# Patient Record
Sex: Male | Born: 1970 | Race: White | Hispanic: No | Marital: Single | Smoking: Current every day smoker
Health system: Southern US, Community
[De-identification: ages and names within clinical notes are randomized; demographics above are authoritative.]

## PROBLEM LIST (undated history)

## (undated) DIAGNOSIS — F1010 Alcohol abuse, uncomplicated: Secondary | ICD-10-CM

## (undated) DIAGNOSIS — M549 Dorsalgia, unspecified: Secondary | ICD-10-CM

## (undated) DIAGNOSIS — G8929 Other chronic pain: Secondary | ICD-10-CM

## (undated) DIAGNOSIS — F1120 Opioid dependence, uncomplicated: Secondary | ICD-10-CM

## (undated) HISTORY — PX: MANDIBLE FRACTURE SURGERY: SHX706

---

## 1993-01-29 HISTORY — PX: MOUTH SURGERY: SHX715

## 1997-09-01 ENCOUNTER — Emergency Department (HOSPITAL_COMMUNITY): Admission: EM | Admit: 1997-09-01 | Discharge: 1997-09-01 | Payer: Self-pay | Admitting: Internal Medicine

## 1997-10-13 ENCOUNTER — Emergency Department (HOSPITAL_COMMUNITY): Admission: EM | Admit: 1997-10-13 | Discharge: 1997-10-13 | Payer: Self-pay | Admitting: Emergency Medicine

## 1997-10-13 ENCOUNTER — Encounter: Payer: Self-pay | Admitting: Emergency Medicine

## 1998-04-26 ENCOUNTER — Encounter: Payer: Self-pay | Admitting: Neurosurgery

## 1998-04-26 ENCOUNTER — Ambulatory Visit (HOSPITAL_COMMUNITY): Admission: RE | Admit: 1998-04-26 | Discharge: 1998-04-26 | Payer: Self-pay | Admitting: Neurosurgery

## 2002-05-18 ENCOUNTER — Emergency Department (HOSPITAL_COMMUNITY): Admission: EM | Admit: 2002-05-18 | Discharge: 2002-05-19 | Payer: Self-pay

## 2002-05-19 ENCOUNTER — Encounter: Payer: Self-pay | Admitting: Emergency Medicine

## 2002-08-26 ENCOUNTER — Emergency Department (HOSPITAL_COMMUNITY): Admission: EM | Admit: 2002-08-26 | Discharge: 2002-08-26 | Payer: Self-pay | Admitting: Emergency Medicine

## 2002-08-26 ENCOUNTER — Encounter: Payer: Self-pay | Admitting: Emergency Medicine

## 2002-09-09 ENCOUNTER — Emergency Department (HOSPITAL_COMMUNITY): Admission: EM | Admit: 2002-09-09 | Discharge: 2002-09-10 | Payer: Self-pay | Admitting: Emergency Medicine

## 2002-09-10 ENCOUNTER — Encounter: Payer: Self-pay | Admitting: Emergency Medicine

## 2002-11-04 ENCOUNTER — Emergency Department (HOSPITAL_COMMUNITY): Admission: AD | Admit: 2002-11-04 | Discharge: 2002-11-04 | Payer: Self-pay

## 2002-11-04 ENCOUNTER — Encounter: Payer: Self-pay | Admitting: Emergency Medicine

## 2003-01-01 ENCOUNTER — Emergency Department (HOSPITAL_COMMUNITY): Admission: EM | Admit: 2003-01-01 | Discharge: 2003-01-01 | Payer: Self-pay | Admitting: Emergency Medicine

## 2005-05-12 ENCOUNTER — Emergency Department (HOSPITAL_COMMUNITY): Admission: EM | Admit: 2005-05-12 | Discharge: 2005-05-12 | Payer: Self-pay | Admitting: Emergency Medicine

## 2007-01-27 ENCOUNTER — Emergency Department (HOSPITAL_COMMUNITY): Admission: EM | Admit: 2007-01-27 | Discharge: 2007-01-27 | Payer: Self-pay | Admitting: Emergency Medicine

## 2008-01-28 ENCOUNTER — Emergency Department (HOSPITAL_COMMUNITY): Admission: EM | Admit: 2008-01-28 | Discharge: 2008-01-28 | Payer: Self-pay | Admitting: Emergency Medicine

## 2009-03-31 ENCOUNTER — Emergency Department (HOSPITAL_COMMUNITY): Admission: EM | Admit: 2009-03-31 | Discharge: 2009-03-31 | Payer: Self-pay | Admitting: Emergency Medicine

## 2009-10-18 ENCOUNTER — Emergency Department (HOSPITAL_COMMUNITY): Admission: EM | Admit: 2009-10-18 | Discharge: 2009-10-18 | Payer: Self-pay | Admitting: Emergency Medicine

## 2010-09-23 ENCOUNTER — Emergency Department (HOSPITAL_COMMUNITY)
Admission: EM | Admit: 2010-09-23 | Discharge: 2010-09-23 | Disposition: A | Discharge status: Home / Self Care | Payer: Self-pay | Attending: Emergency Medicine | Admitting: Emergency Medicine

## 2010-09-23 ENCOUNTER — Emergency Department (HOSPITAL_COMMUNITY): Payer: Self-pay

## 2010-09-23 DIAGNOSIS — IMO0002 Reserved for concepts with insufficient information to code with codable children: Secondary | ICD-10-CM | POA: Insufficient documentation

## 2010-09-23 DIAGNOSIS — R0682 Tachypnea, not elsewhere classified: Secondary | ICD-10-CM | POA: Insufficient documentation

## 2010-09-23 DIAGNOSIS — F17200 Nicotine dependence, unspecified, uncomplicated: Secondary | ICD-10-CM | POA: Insufficient documentation

## 2010-09-23 DIAGNOSIS — M25469 Effusion, unspecified knee: Secondary | ICD-10-CM | POA: Insufficient documentation

## 2010-09-23 DIAGNOSIS — M25569 Pain in unspecified knee: Secondary | ICD-10-CM | POA: Insufficient documentation

## 2010-10-10 ENCOUNTER — Inpatient Hospital Stay (INDEPENDENT_AMBULATORY_CARE_PROVIDER_SITE_OTHER)
Admission: RE | Admit: 2010-10-10 | Discharge: 2010-10-10 | Disposition: A | Discharge status: Home / Self Care | Payer: Self-pay | Source: Ambulatory Visit | Attending: Family Medicine | Admitting: Family Medicine

## 2010-10-10 DIAGNOSIS — L02419 Cutaneous abscess of limb, unspecified: Secondary | ICD-10-CM

## 2010-10-10 DIAGNOSIS — L02429 Furuncle of limb, unspecified: Secondary | ICD-10-CM

## 2010-10-15 ENCOUNTER — Inpatient Hospital Stay (HOSPITAL_COMMUNITY)
Admission: EM | Admit: 2010-10-15 | Discharge: 2010-10-18 | DRG: 603 | Disposition: A | Discharge status: Home / Self Care | Payer: Medicaid Other | Attending: Family Medicine | Admitting: Family Medicine

## 2010-10-15 DIAGNOSIS — M7989 Other specified soft tissue disorders: Secondary | ICD-10-CM

## 2010-10-15 DIAGNOSIS — M545 Low back pain, unspecified: Secondary | ICD-10-CM | POA: Diagnosis present

## 2010-10-15 DIAGNOSIS — M25476 Effusion, unspecified foot: Secondary | ICD-10-CM | POA: Diagnosis present

## 2010-10-15 DIAGNOSIS — M25473 Effusion, unspecified ankle: Secondary | ICD-10-CM | POA: Diagnosis present

## 2010-10-15 DIAGNOSIS — M79609 Pain in unspecified limb: Secondary | ICD-10-CM

## 2010-10-15 DIAGNOSIS — L02419 Cutaneous abscess of limb, unspecified: Principal | ICD-10-CM | POA: Diagnosis present

## 2010-10-15 DIAGNOSIS — F17200 Nicotine dependence, unspecified, uncomplicated: Secondary | ICD-10-CM | POA: Diagnosis present

## 2010-10-15 DIAGNOSIS — G894 Chronic pain syndrome: Secondary | ICD-10-CM | POA: Diagnosis present

## 2010-10-15 DIAGNOSIS — M25579 Pain in unspecified ankle and joints of unspecified foot: Secondary | ICD-10-CM | POA: Diagnosis present

## 2010-10-15 DIAGNOSIS — A4901 Methicillin susceptible Staphylococcus aureus infection, unspecified site: Secondary | ICD-10-CM | POA: Diagnosis present

## 2010-10-15 LAB — CBC
HCT: 36.9 % — ABNORMAL LOW (ref 39.0–52.0)
MCH: 31.2 pg (ref 26.0–34.0)
RBC: 4.23 MIL/uL (ref 4.22–5.81)
RBC: 4.06 MIL/uL — ABNORMAL LOW (ref 4.22–5.81)
WBC: 10 10*3/uL (ref 4.0–10.5)
WBC: 9.3 10*3/uL (ref 4.0–10.5)

## 2010-10-15 LAB — POCT I-STAT, CHEM 8
Calcium, Ion: 1.11 mmol/L — ABNORMAL LOW (ref 1.12–1.32)
Glucose, Bld: 130 mg/dL — ABNORMAL HIGH (ref 70–99)
HCT: 39 % (ref 39.0–52.0)
Potassium: 3.4 mEq/L — ABNORMAL LOW (ref 3.5–5.1)
Sodium: 139 mEq/L (ref 135–145)
TCO2: 22 mmol/L (ref 0–100)

## 2010-10-15 LAB — DIFFERENTIAL
Basophils Absolute: 0.1 10*3/uL (ref 0.0–0.1)
Basophils Relative: 1 % (ref 0–1)
Eosinophils Absolute: 0.7 10*3/uL (ref 0.0–0.7)
Eosinophils Relative: 7 % — ABNORMAL HIGH (ref 0–5)
Lymphocytes Relative: 35 % (ref 12–46)
Lymphs Abs: 3.5 10*3/uL (ref 0.7–4.0)
Monocytes Relative: 11 % (ref 3–12)
Neutro Abs: 4.5 10*3/uL (ref 1.7–7.7)
Neutrophils Relative %: 45 % (ref 43–77)

## 2010-10-15 NOTE — H&P (Signed)
Nicholas [name], TERHUNE NO.:  [id number]  MEDICAL RECORD NO.:  [id number]  LOCATION:  WLED                         FACILITY:  [facility]  PHYSICIAN:  Della Goo, M.D. DATE OF BIRTH:  07-25-1970  DATE OF ADMISSION:  10/15/2010 DATE OF DISCHARGE:                             HISTORY & PHYSICAL   PRIMARY CARE PHYSICIAN:  None.  CHIEF COMPLAINT:  Boil, worsening on both legs.  HISTORY OF PRESENT ILLNESS:  This is a 40 year old male, who presents to the emergency department secondary to worsening boils on both legs that erupted about 1 week ago.  The patient went to the [facility] on Tuesday which was 5 days ago and was seen, evaluated there and given a prescription for doxycycline.  He took the doxycycline; however, his symptoms continued to worsen.  He was seen in the emergency department this evening and was evaluated by the EDP, who performed an I and D of the three abscesses.  Cultures were sent and the patient was placed on IV vancomycin therapy.  The patient denied having any fevers or chills, nausea, vomiting or diarrhea.  He denies that anyone else around him has had any outbreaks of boils.  PAST MEDICAL HISTORY:  Significant for chronic back pain.  PAST SURGICAL HISTORY:  None.  MEDICATIONS:  Doxycycline and ibuprofen.  ALLERGIES:  No known drug allergies.  SOCIAL HISTORY:  The patient is a smoker.  He smokes a pack of cigarettes daily for 20+ years.  He is a nondrinker.  He denies any illicit drug usage.  FAMILY HISTORY:  Noncontributory.  REVIEW OF SYSTEMS:  Pertinents as mentioned above.  PHYSICAL EXAMINATION FINDINGS:  GENERAL:  This is a 40 year old,  well- nourished, well-developed Caucasian male who is in no acute distress. VITAL SIGNS:  Temperature 98.2, blood pressure 130/71, heart rate 91, respirations 16, O2 sats 98%. HEENT:  Normocephalic, atraumatic.  Pupils equally round and reactive to light.  Extraocular movements  are intact.  Funduscopic benign.  There is no scleral icterus.  Nares are patent bilaterally.  Oropharynx is clear. NECK:  Supple, full range of motion.  No thyromegaly, adenopathy, jugular venous distention. CARDIOVASCULAR:  Regular rate and rhythm.  Normal S1, S2.  No murmurs, gallops or rubs. CHEST:  Chest wall, nontender.  No unusual masses.  Chest wall excursion is symmetric and breathing is unlabored. LUNGS:  Clear to auscultation bilaterally.  No rales, rhonchi or wheezes. ABDOMEN:  Positive bowel sounds.  Soft, nontender, nondistended.  No hepatosplenomegaly. EXTREMITIES:  Edema in the left lower extremity.  There is also an excoriation on the dorsum of the left foot near the great toe.  The patient also has one abscess on the right thigh area which has an erythematous base of about 1 cm.  He also has 2 abscesses on the left thigh area which are about the same circumference in diameter. NEUROLOGIC:  Nonfocal.  LABORATORY STUDIES:  White blood cell count 10.0, hemoglobin 13.2, hematocrit 37.7, MCV 89.1, platelets 248, neutrophils 45%, lymphocytes 35%.  Sodium 139, potassium 3.4, chloride 104, CO2 of 22, BUN 19, creatinine 0.90, glucose 130.  ASSESSMENT:  A 40 year old male being admitted with: 1. Cellulitis and  abscesses of both lower legs, most likely     methicillin-resistant Staphylococcus aureus infection. 2. Chronic pain syndrome. 3. Lumbago. 4. Tobacco abuse.  PLAN:  The patient will be admitted and placed on the IV vancomycin therapy.  Hibiclens washes will be ordered b.i.d. to the infected areas. The patient will be placed on IV fluids and pain control therapy has also been ordered.  A nicotine patch will also be ordered for this patient.  The patient is a full code and further workup once the pending results of the patient's clinical course and studies.  His antibiotic therapy will be adjusted, pending results of the cultures.     Della Goo,  M.D.     HJ/MEDQ  D:  10/15/2010  T:  10/15/2010  Job:  [id number]  Electronically Signed by Della Goo M.D. on 10/15/2010 09:59:11 PM

## 2010-10-16 ENCOUNTER — Inpatient Hospital Stay (HOSPITAL_COMMUNITY): Payer: Medicaid Other

## 2010-10-16 DIAGNOSIS — L02419 Cutaneous abscess of limb, unspecified: Secondary | ICD-10-CM

## 2010-10-16 DIAGNOSIS — L03119 Cellulitis of unspecified part of limb: Secondary | ICD-10-CM

## 2010-10-16 LAB — BASIC METABOLIC PANEL
BUN: 8 mg/dL (ref 6–23)
CO2: 26 mEq/L (ref 19–32)
Calcium: 8.5 mg/dL (ref 8.4–10.5)
Creatinine, Ser: 0.74 mg/dL (ref 0.50–1.35)
GFR calc non Af Amer: >60 mL/min (ref >60)
Potassium: 4.1 mEq/L (ref 3.5–5.1)
Sodium: 140 mEq/L (ref 135–145)

## 2010-10-16 LAB — CBC
HCT: 40.3 % (ref 39.0–52.0)
MCH: 30.6 pg (ref 26.0–34.0)

## 2010-10-16 LAB — VANCOMYCIN, TROUGH: Vancomycin Tr: 9.6 ug/mL — ABNORMAL LOW (ref 10.0–20.0)

## 2010-10-17 LAB — DIFFERENTIAL
Eosinophils Absolute: 0.5 10*3/uL (ref 0.0–0.7)
Lymphocytes Relative: 32 % (ref 12–46)

## 2010-10-17 LAB — CBC
Hemoglobin: 14 g/dL (ref 13.0–17.0)
MCH: 30.5 pg (ref 26.0–34.0)
Platelets: 216 10*3/uL (ref 150–400)
RDW: 12.6 % (ref 11.5–15.5)

## 2010-10-17 LAB — MRSA PCR SCREENING: MRSA by PCR: NEGATIVE

## 2010-10-18 LAB — CBC
HCT: 42.9 % (ref 39.0–52.0)
Hemoglobin: 14.3 g/dL (ref 13.0–17.0)
RDW: 12.5 % (ref 11.5–15.5)
WBC: 10.7 10*3/uL — ABNORMAL HIGH (ref 4.0–10.5)

## 2010-10-18 LAB — CULTURE, ROUTINE-ABSCESS

## 2010-10-18 LAB — WOUND CULTURE

## 2010-10-18 LAB — BASIC METABOLIC PANEL
BUN: 9 mg/dL (ref 6–23)
Chloride: 103 mEq/L (ref 96–112)
GFR calc Af Amer: >60 mL/min (ref >60)
Potassium: 4 mEq/L (ref 3.5–5.1)
Sodium: 137 mEq/L (ref 135–145)
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
## 2010-10-18 NOTE — Op Note (Signed)
  NAMEJAROME, Nicholas [name] NO.:  [id number]  MEDICAL RECORD NO.:  [id number]  LOCATION:  1407                         FACILITY:  [facility]  PHYSICIAN:  Angelia Mould. Derrell Lolling, M.D.DATE OF BIRTH:  06-Mar-1970  DATE OF PROCEDURE:  10/17/2010 DATE OF DISCHARGE:                              OPERATIVE REPORT   PREOPERATIVE DIAGNOSIS:  Multiple abscesses, bilateral thighs.  POSTOPERATIVE DIAGNOSIS:  Multiple abscesses, bilateral thighs.  OPERATION PERFORMED: 1. Incision and drainage of abscess, right lateral thigh. 2. Incision and drainage of 2 abscesses of distal left thigh.  SURGEON:  Angelia Mould. Derrell Lolling, MD  FIRST ASSISTANT:  Eber Hong, PA  OPERATIVE INDICATIONS:  This is a 40 year old Caucasian man who presented to the emergency room with multiple soft tissue abscesses which were localized to the distal thighs.  He had been to [facility] previously and given a prescription for doxycycline.  He continued to worsen.  He came to the emergency room and was seen by emergency department physician who performed incision and drainage of the 3 abscesses.  He did not have any fever or chills, but the pain and swelling has gotten worse.  He was admitted late yesterday afternoon and we were asked to see him and found that he had 2 abscesses in the left distal thigh and 1 abscess in the right distal thigh, both of these were tender.  They had evidence of recent incision, there were a little fluctuant.  There was no active drainage.  It was felt that we should explore these in the operating room.  OPERATIVE TECHNIQUE:  Following the induction of general endotracheal anesthesia, the patient's both legs were prepped and draped in the usual sterile fashion.  The patient was identified as correct patient, correct procedure, and correct sites and time-out was held.  We observed a small abscess in the right distal thigh and 2 in the left distal thigh.  All 3 of these  were incised with a knife.  They actually looked better than yesterday and there was only minimal purulent fluid. We cultured the fluid from the left lateral thigh abscess.  We explored all of these wounds digitally and with hemostats and found no evidence of any tracking or sinus formation.  There was no odor.  There was no evidence of fasciitis.  We felt that these were localized abscesses.  They were packed with iodoform gauze. Clean bandages were placed and the patient taken to recovery room in stable condition.  Estimated blood loss was about 15 cc.  Complications none.  Sponge, needle, and instrument counts were correct.     Angelia Mould. Derrell Lolling, M.D.     HMI/MEDQ  D:  10/17/2010  T:  10/17/2010  Job:  [id number]  cc:   Della Goo, M.D. Fax: [phone]  Electronically Signed by Claud Kelp M.D. on 10/18/2010 03:20:52 PM

## 2010-10-20 LAB — CULTURE, ROUTINE-ABSCESS
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
## 2010-10-22 LAB — ANAEROBIC CULTURE

## 2010-11-03 LAB — I-STAT 8, (EC8 V) (CONVERTED LAB)
BUN: 10
Chloride: 109
Glucose, Bld: 104 — ABNORMAL HIGH
pCO2, Ven: 38.7 — ABNORMAL LOW
pH, Ven: 7.371 — ABNORMAL HIGH

## 2010-11-03 LAB — RAPID URINE DRUG SCREEN, HOSP PERFORMED
Barbiturates: NOT DETECTED
Benzodiazepines: NOT DETECTED
Cocaine: POSITIVE — AB
Opiates: NOT DETECTED

## 2010-11-03 LAB — POCT CARDIAC MARKERS
CKMB, poc: <1 — ABNORMAL LOW
Myoglobin, poc: 75.5
Operator id: 282201

## 2010-11-03 LAB — POCT I-STAT CREATININE
Creatinine, Ser: 1
Operator id: 282201

## 2010-11-03 LAB — HEPATIC FUNCTION PANEL
Alkaline Phosphatase: 71
Bilirubin, Direct: <0.1
Total Bilirubin: 0.3

## 2010-11-03 LAB — LIPASE, BLOOD: Lipase: 43

## 2010-11-03 LAB — ETHANOL: Alcohol, Ethyl (B): 179 — ABNORMAL HIGH

## 2010-11-24 ENCOUNTER — Emergency Department (HOSPITAL_COMMUNITY)
Admission: EM | Admit: 2010-11-24 | Discharge: 2010-11-25 | Disposition: A | Discharge status: Home / Self Care | Payer: Medicaid Other | Attending: Emergency Medicine | Admitting: Emergency Medicine

## 2010-11-24 DIAGNOSIS — F329 Major depressive disorder, single episode, unspecified: Secondary | ICD-10-CM | POA: Insufficient documentation

## 2010-11-24 DIAGNOSIS — F3289 Other specified depressive episodes: Secondary | ICD-10-CM | POA: Insufficient documentation

## 2010-11-24 DIAGNOSIS — F1010 Alcohol abuse, uncomplicated: Secondary | ICD-10-CM | POA: Insufficient documentation

## 2010-11-24 LAB — COMPREHENSIVE METABOLIC PANEL
ALT: 32 U/L (ref 0–53)
AST: 22 U/L (ref 0–37)
Alkaline Phosphatase: 90 U/L (ref 39–117)
CO2: 24 mEq/L (ref 19–32)
Calcium: 9.5 mg/dL (ref 8.4–10.5)
Chloride: 102 mEq/L (ref 96–112)
GFR calc Af Amer: >90 mL/min (ref >90)
GFR calc non Af Amer: >90 mL/min (ref >90)
Glucose, Bld: 86 mg/dL (ref 70–99)
Sodium: 138 mEq/L (ref 135–145)
Total Bilirubin: 0.5 mg/dL (ref 0.3–1.2)

## 2010-11-24 LAB — DIFFERENTIAL
Basophils Absolute: 0.1 10*3/uL (ref 0.0–0.1)
Lymphocytes Relative: 30 % (ref 12–46)
Monocytes Absolute: 0.8 10*3/uL (ref 0.1–1.0)
Monocytes Relative: 7 % (ref 3–12)
Neutro Abs: 6.8 10*3/uL (ref 1.7–7.7)
Neutrophils Relative %: 61 % (ref 43–77)

## 2010-11-24 LAB — CBC
HCT: 48 % (ref 39.0–52.0)
Hemoglobin: 16.6 g/dL (ref 13.0–17.0)
MCHC: 34.6 g/dL (ref 30.0–36.0)
RBC: 5.32 MIL/uL (ref 4.22–5.81)

## 2010-11-24 LAB — RAPID URINE DRUG SCREEN, HOSP PERFORMED: Opiates: NOT DETECTED

## 2010-11-24 LAB — ETHANOL: Alcohol, Ethyl (B): 147 mg/dL — ABNORMAL HIGH (ref 0–11)

## 2011-09-16 ENCOUNTER — Emergency Department (HOSPITAL_COMMUNITY)
Admission: EM | Admit: 2011-09-16 | Discharge: 2011-09-16 | Disposition: A | Discharge status: Home / Self Care | Payer: Medicaid Other | Source: Home / Self Care | Attending: Family Medicine | Admitting: Family Medicine

## 2011-09-16 ENCOUNTER — Emergency Department (INDEPENDENT_AMBULATORY_CARE_PROVIDER_SITE_OTHER): Payer: Medicaid Other

## 2011-09-16 ENCOUNTER — Encounter (HOSPITAL_COMMUNITY): Payer: Self-pay | Admitting: *Deleted

## 2011-09-16 DIAGNOSIS — J40 Bronchitis, not specified as acute or chronic: Secondary | ICD-10-CM

## 2011-09-16 MED ORDER — IBUPROFEN 800 MG PO TABS
800.0000 mg | ORAL_TABLET | Freq: Once | ORAL | Status: AC
  Administered 2011-09-16: 800 mg via ORAL

## 2011-09-16 MED ORDER — METHYLPREDNISOLONE SODIUM SUCC 125 MG IJ SOLR
125.0000 mg | Freq: Once | INTRAMUSCULAR | Status: AC
  Administered 2011-09-16: 125 mg via INTRAMUSCULAR

## 2011-09-16 MED ORDER — DEXTROMETHORPHAN-GUAIFENESIN 20-400 MG PO TABS: 1.0000 | ORAL_TABLET | Freq: Three times a day (TID) | ORAL | Status: DC | PRN

## 2011-09-16 MED ORDER — ALBUTEROL SULFATE (5 MG/ML) 0.5% IN NEBU
INHALATION_SOLUTION | RESPIRATORY_TRACT | Status: AC
  Filled 2011-09-16: qty 1

## 2011-09-16 MED ORDER — IBUPROFEN 800 MG PO TABS
ORAL_TABLET | ORAL | Status: AC
  Filled 2011-09-16: qty 1

## 2011-09-16 MED ORDER — ALBUTEROL SULFATE (5 MG/ML) 0.5% IN NEBU
2.5000 mg | INHALATION_SOLUTION | Freq: Once | RESPIRATORY_TRACT | Status: AC
  Administered 2011-09-16: 2.5 mg via RESPIRATORY_TRACT

## 2011-09-16 MED ORDER — METHYLPREDNISOLONE SODIUM SUCC 125 MG IJ SOLR
INTRAMUSCULAR | Status: AC
  Filled 2011-09-16: qty 2

## 2011-09-16 MED ORDER — DOXYCYCLINE HYCLATE 100 MG PO CAPS: 100.0000 mg | ORAL_CAPSULE | Freq: Two times a day (BID) | ORAL | Status: AC

## 2011-09-16 MED ORDER — ALBUTEROL SULFATE HFA 108 (90 BASE) MCG/ACT IN AERS: 1.0000 | INHALATION_SPRAY | Freq: Four times a day (QID) | RESPIRATORY_TRACT | Status: DC | PRN

## 2011-09-16 MED ORDER — PREDNISONE 20 MG PO TABS: ORAL_TABLET | ORAL | Status: AC

## 2011-09-16 MED ORDER — IPRATROPIUM BROMIDE 0.02 % IN SOLN
0.5000 mg | Freq: Once | RESPIRATORY_TRACT | Status: AC
  Administered 2011-09-16: 0.5 mg via RESPIRATORY_TRACT

## 2011-09-16 MED ORDER — HYDROCODONE-IBUPROFEN 7.5-200 MG PO TABS: 1.0000 | ORAL_TABLET | Freq: Four times a day (QID) | ORAL | Status: DC | PRN

## 2011-09-16 NOTE — ED Notes (Signed)
States feels like breathing is much better now.

## 2011-09-16 NOTE — ED Notes (Signed)
C/O generalized chest pain and tightness, chest congestion, productive cough x 1 wk without fevers.  Has taken Mucinex without any relief.  Coarse rales noted throughout bilat lungs.

## 2011-09-16 NOTE — ED Provider Notes (Signed)
History     CSN: [id number]  Arrival date & time 09/16/11  1846   First MD Initiated Contact with Patient 09/16/11 1904      Chief Complaint  Patient presents with  . Chest Pain  . Cough    (Consider location/radiation/quality/duration/timing/severity/associated sxs/prior treatment) HPI Comments: 41 year old male smoker 1 pack a day for 30 years. Here complaining of chest tightness and generalized pain with coughing, wheezing, congestion and productive cough for one week. No pain radiation to neck or jaw. No diaphoresis. Chest pain and other symptoms unchanged today but persistent and worse at night time despite taking Mucinex without relief. Denies fever or or chills. Appetite at baseline. No headache or dizziness.    History reviewed. No pertinent past medical history.  History reviewed. No pertinent past surgical history.  No family history on file.  History  Substance Use Topics  . Smoking status: Current Everyday Smoker -- 1.0 packs/day  . Smokeless tobacco: Not on file  . Alcohol Use: No      Review of Systems  Constitutional: Positive for chills. Negative for fever and appetite change.  HENT: Positive for congestion. Negative for sore throat, rhinorrhea, neck pain and sinus pressure.   Respiratory: Positive for cough, chest tightness, shortness of breath and wheezing.   Cardiovascular: Positive for chest pain. Negative for palpitations and leg swelling.  Gastrointestinal: Negative for nausea, vomiting and diarrhea.  Skin: Negative for rash.  Neurological: Negative for dizziness and headaches.    Allergies  Review of patient's allergies indicates no known allergies.  Home Medications   Current Outpatient Rx  Name Route Sig Dispense Refill  . ALBUTEROL SULFATE HFA 108 (90 BASE) MCG/ACT IN AERS Inhalation Inhale 1-2 puffs into the lungs every 6 (six) hours as needed for wheezing. 1 Inhaler 0  . DEXTROMETHORPHAN-GUAIFENESIN 20-400 MG PO TABS Oral Take 1 tablet  by mouth 3 (three) times daily between meals as needed. 20 each 0  . DOXYCYCLINE HYCLATE 100 MG PO CAPS Oral Take 1 capsule (100 mg total) by mouth 2 (two) times daily. 20 capsule 0  . PREDNISONE 20 MG PO TABS  2 tablets by mouth daily for 5 days 10 tablet 0    BP 125/77  Pulse 67  Temp 98.2 F (36.8 C) (Oral)  Resp 22  SpO2 97%  Physical Exam  Nursing note and vitals reviewed. Constitutional: He is oriented to person, place, and time. He appears well-developed and well-nourished. No distress.  HENT:  Head: Normocephalic and atraumatic.       Nasal Congestion with erythema and swelling of nasal turbinates, clear rhinorrhea. pharyngeal erythema no exudates. No uvula deviation. No trismus. TM's normal.  Eyes: Conjunctivae and EOM are normal. Pupils are equal, round, and reactive to light. Right eye exhibits no discharge. Left eye exhibits no discharge. No scleral icterus.  Neck: Neck supple. No JVD present.  Cardiovascular: Normal rate, regular rhythm and normal heart sounds.  Exam reveals no gallop and no friction rub.   No murmur heard. Pulmonary/Chest:       Prolonged expiration with expiratory wheezing and rhonchi bilaterally. Bronchitic cough with yellow non bloody sputum while on exam. No rales. No orthopnea or tachypnea.  Lymphadenopathy:    He has no cervical adenopathy.  Neurological: He is alert and oriented to person, place, and time.  Skin: No rash noted. He is not diaphoretic.    ED Course  Procedures (including critical care time)  Labs Reviewed - No data to display Dg Chest 2  View  09/16/2011  *RADIOLOGY REPORT*  Clinical Data: Cough for 1 week.  Smoker.  CHEST - 2 VIEW  Comparison: Plain film chest 03/31/2009 and PA and lateral chest 01/28/2008.  Findings: Lungs are clear.  Heart size is normal.  No pneumothorax or pleural fluid.  IMPRESSION: No acute disease.  Original Report Authenticated By: Bernadene Bell. D'ALESSIO, M.D.     1. Bronchitis    EKG normal sinus  rhythm. Heart rate 67. Right bundle branch. No ST or other acute ischemic changes. ( no prior EKG found for comparison)    MDM  Patient with clinical findings suggestive of COPD/chronic bronchitis likely acute on chronic; although patient has not been diagnosed with respiratory condition in the past. Symptoms and lung exam improved with albuterol/ipratropium nebulization here. Administered Solu-Medrol 125 mg intramuscular x1. Prescribed prednisone, doxycycline, albuterol inhaler and guaifenesin/dextromethorphan. Asked to go to the emergency department if worsening symptoms despite treatment. Also patient made aware of the presence of RBBB and provided cardiology referral. Encouraged to quit smoking!        Sharin Grave, MD 09/17/11 1255

## 2012-02-01 ENCOUNTER — Emergency Department (INDEPENDENT_AMBULATORY_CARE_PROVIDER_SITE_OTHER): Payer: Medicaid Other

## 2012-02-01 ENCOUNTER — Emergency Department (INDEPENDENT_AMBULATORY_CARE_PROVIDER_SITE_OTHER)
Admission: EM | Admit: 2012-02-01 | Discharge: 2012-02-01 | Disposition: A | Discharge status: Other Acute Inpatient Hospital | Payer: Medicaid Other | Source: Home / Self Care

## 2012-02-01 ENCOUNTER — Emergency Department (HOSPITAL_COMMUNITY)
Admission: EM | Admit: 2012-02-01 | Discharge: 2012-02-01 | Discharge status: Against Medical Advice | Payer: Medicaid Other | Attending: Emergency Medicine | Admitting: Emergency Medicine

## 2012-02-01 ENCOUNTER — Encounter (HOSPITAL_COMMUNITY): Payer: Self-pay

## 2012-02-01 DIAGNOSIS — R52 Pain, unspecified: Secondary | ICD-10-CM

## 2012-02-01 DIAGNOSIS — R109 Unspecified abdominal pain: Secondary | ICD-10-CM | POA: Insufficient documentation

## 2012-02-01 LAB — CBC WITH DIFFERENTIAL/PLATELET
Basophils Relative: 2 % — ABNORMAL HIGH (ref 0–1)
Eosinophils Absolute: 0.1 10*3/uL (ref 0.0–0.7)
Hemoglobin: 14.8 g/dL (ref 13.0–17.0)
Lymphs Abs: 1.1 10*3/uL (ref 0.7–4.0)
MCH: 30.4 pg (ref 26.0–34.0)
MCHC: 33.6 g/dL (ref 30.0–36.0)
Neutro Abs: 5.2 10*3/uL (ref 1.7–7.7)
Neutrophils Relative %: 65 % (ref 43–77)
Platelets: 179 10*3/uL (ref 150–400)
RBC: 4.87 MIL/uL (ref 4.22–5.81)

## 2012-02-01 LAB — POCT URINALYSIS DIP (DEVICE)
Bilirubin Urine: NEGATIVE
Glucose, UA: NEGATIVE mg/dL
Leukocytes, UA: NEGATIVE
Nitrite: NEGATIVE
Urobilinogen, UA: 0.2 mg/dL (ref 0.0–1.0)

## 2012-02-01 LAB — POCT I-STAT, CHEM 8
Chloride: 105 mEq/L (ref 96–112)
HCT: 47 % (ref 39.0–52.0)
Hemoglobin: 16 g/dL (ref 13.0–17.0)
Potassium: 3.9 mEq/L (ref 3.5–5.1)
Sodium: 139 mEq/L (ref 135–145)

## 2012-02-01 MED ORDER — KETOROLAC TROMETHAMINE 60 MG/2ML IM SOLN
60.0000 mg | Freq: Once | INTRAMUSCULAR | Status: AC
  Administered 2012-02-01: 60 mg via INTRAMUSCULAR

## 2012-02-01 MED ORDER — KETOROLAC TROMETHAMINE 60 MG/2ML IM SOLN
INTRAMUSCULAR | Status: AC
  Filled 2012-02-01: qty 2

## 2012-02-01 NOTE — ED Provider Notes (Signed)
History     CSN: [id number]  Arrival date & time 02/01/12  1026   None     Chief Complaint  Patient presents with  . Abdominal Pain    (Consider location/radiation/quality/duration/timing/severity/associated sxs/prior treatment) HPI Comments: 42 year old male presents with pain across the mid and lower abdomen for 4 days. States his been getting worse for the past 2 days is also complaining of pain across his low back for 4 days. The pain is primarily located across the lower abdomen and it described as severe. He is moaning groaning and writhing in pain during the exam. While attempting to obtain a KUB the patient was unable to hold still due to the combination of severe abdominal pain and back pain. He states that his legs hurt as well. Associated symptoms include urinary frequency but no dysuria. He states that he had been drinking a lot of sodas recently and thought his kidneys were causing the pain, so for the past 4 days he has increased his by mouth water intake. He denies fever, vomiting, diarrhea in the past week. Denies upper respiratory symptoms, fever or chills. His back pain is located in the paralumbar and lower parathoracic musculature.  Lumbar muscular pain with marked tenderness in the associated musculature. The pain is significantly worse with movement, bending and pulling. The pain is exacerbated during exam when lying down from a seated position or rising to a seated position from a supine position. Denies any known injury or event that may have caused pain in the back or abdomen. He denies recent alcohol intake.   History reviewed. No pertinent past medical history.  History reviewed. No pertinent past surgical history.  History reviewed. No pertinent family history.  History  Substance Use Topics  . Smoking status: Current Every Day Smoker -- 1.0 packs/day  . Smokeless tobacco: Not on file  . Alcohol Use: No      Review of Systems  Constitutional: Positive  for activity change and appetite change. Negative for fever, chills and fatigue.  HENT: Negative.   Respiratory: Negative.   Cardiovascular: Negative.   Gastrointestinal: Positive for abdominal pain. Negative for nausea, vomiting, diarrhea, constipation, blood in stool and abdominal distention.  Genitourinary: Positive for frequency. Negative for dysuria, hematuria, discharge, difficulty urinating and testicular pain.  Skin: Negative.   Neurological: Negative.     Allergies  Review of patient's allergies indicates no known allergies.  Home Medications   Current Outpatient Rx  Name  Route  Sig  Dispense  Refill  . ALBUTEROL SULFATE HFA 108 (90 BASE) MCG/ACT IN AERS   Inhalation   Inhale 1-2 puffs into the lungs every 6 (six) hours as needed for wheezing.   1 Inhaler   0   . DEXTROMETHORPHAN-GUAIFENESIN 20-400 MG PO TABS   Oral   Take 1 tablet by mouth 3 (three) times daily between meals as needed.   20 each   0     BP 133/74  Pulse 90  Temp 98.2 F (36.8 C) (Oral)  Resp 22  SpO2 98%  Physical Exam  Nursing note and vitals reviewed. Constitutional: He is oriented to person, place, and time. He appears well-developed and well-nourished.       When he entered the room the patient is lying on his side wincing groaning and moaning. He continues to do this throughout the exam. He remains alert and oriented in no acute distress.  Eyes: Conjunctivae normal and EOM are normal.  Neck: Normal range of motion. Neck supple.  Cardiovascular: Normal rate, regular rhythm and normal heart sounds.   No murmur heard. Pulmonary/Chest: Effort normal and breath sounds normal. No respiratory distress. He has no wheezes. He has no rales.  Abdominal: Soft. Bowel sounds are normal. He exhibits no distension and no mass. There is tenderness. There is no rebound and no guarding.       Abdominal exam: Tenderness in the epigastrium as well as in the lower abdomen bilaterally. There is no guarding or  rebound and the abdomen remained soft. He states that pressing on these areas is tender however he does not withdrawal with pain. He continues to fold his arms across his abdomen, doubled over and expressing persistent severe pain.  Musculoskeletal:       Marked tenderness in the lower back paralumbar and lower parathoracic musculature. As noted above attempts to lie supine and sit straight up and other movements exacerbate the back pain. No spinal tenderness or deformity. No overlying skin changes or discoloration.  Neurological: He is alert and oriented to person, place, and time. He exhibits normal muscle tone.  Skin: Skin is warm and dry. No erythema.    ED Course  Procedures (including critical care time)  Labs Reviewed  POCT URINALYSIS DIP (DEVICE) - Abnormal; Notable for the following:    Hgb urine dipstick TRACE (*)     Protein, ur 30 (*)     All other components within normal limits  CBC WITH DIFFERENTIAL   No results found.   1. Abdominal pain, acute       MDM  There are no objective findings that support an etiology for the amount of pain  the patient is having. X-ray shows that he has a large amount of stool in his colon however the pain is disproportionately  elevated in consideration of constipation as the diagnosis. Although, after additional evaluation this may end up being the final diagnosis. The Toradol did help some with his back pain which is most likely musculoskeletal however his abdominal pain is persistent. He will be transferred to the emergency department for further evaluation of moderate to severe abdominal pain. He is stable enough to be transferred via shuttle. Results for orders placed during the hospital encounter of 02/01/12  POCT URINALYSIS DIP (DEVICE)      Component Value Range   Glucose, UA NEGATIVE  NEGATIVE mg/dL   Bilirubin Urine NEGATIVE  NEGATIVE   Ketones, ur NEGATIVE  NEGATIVE mg/dL   Specific Gravity, Urine 1.025  1.005 - 1.030   Hgb  urine dipstick TRACE (*) NEGATIVE   pH 6.5  5.0 - 8.0   Protein, ur 30 (*) NEGATIVE mg/dL   Urobilinogen, UA 0.2  0.0 - 1.0 mg/dL   Nitrite NEGATIVE  NEGATIVE   Leukocytes, UA NEGATIVE  NEGATIVE  CBC WITH DIFFERENTIAL      Component Value Range   WBC 8.0  4.0 - 10.5 K/uL   RBC 4.87  4.22 - 5.81 MIL/uL   Hemoglobin 14.8  13.0 - 17.0 g/dL   HCT 45.4  09.8 - 11.9 %   MCV 90.3  78.0 - 100.0 fL   MCH 30.4  26.0 - 34.0 pg   MCHC 33.6  30.0 - 36.0 g/dL   RDW 14.7  82.9 - 56.2 %   Platelets 179  150 - 400 K/uL   Neutrophils Relative 65  43 - 77 %   Neutro Abs 5.2  1.7 - 7.7 K/uL   Lymphocytes Relative 13  12 - 46 %  Lymphs Abs 1.1  0.7 - 4.0 K/uL   Monocytes Relative 19 (*) 3 - 12 %   Monocytes Absolute 1.5 (*) 0.1 - 1.0 K/uL   Eosinophils Relative 2  0 - 5 %   Eosinophils Absolute 0.1  0.0 - 0.7 K/uL   Basophils Relative 2 (*) 0 - 1 %   Basophils Absolute 0.1  0.0 - 0.1 K/uL  POCT I-STAT, CHEM 8      Component Value Range   Sodium 139  135 - 145 mEq/L   Potassium 3.9  3.5 - 5.1 mEq/L   Chloride 105  96 - 112 mEq/L   BUN 13  6 - 23 mg/dL   Creatinine, Ser 7.82  0.50 - 1.35 mg/dL   Glucose, Bld 73  70 - 99 mg/dL   Calcium, Ion 9.56  1.12 - 1.23 mmol/L   TCO2 27  0 - 100 mmol/L   Hemoglobin 16.0  13.0 - 17.0 g/dL   HCT 21.3  08.6 - 57.8 %           Hayden Rasmussen, NP 02/01/12 1210

## 2012-02-01 NOTE — ED Notes (Signed)
Bilateral flank pain with lower abdominal pain denies any n/v/d denies any hematuria , but is  urgency

## 2012-02-01 NOTE — ED Notes (Signed)
4 day duration of pain mid back circling to upper abdominal area; denies n/v/d, no one else in home ill ; denies blood in stool or urine

## 2012-02-05 NOTE — ED Provider Notes (Signed)
Medical screening examination/treatment/procedure(s) were performed by resident physician or non-physician practitioner and as supervising physician I was immediately available for consultation/collaboration.   Nicholas [name] DOUGLAS MD.    Mycah Mcdougall D Saniyah Mondesir, MD 02/05/12 1353 

## 2012-08-26 ENCOUNTER — Emergency Department (HOSPITAL_COMMUNITY)
Admission: EM | Admit: 2012-08-26 | Discharge: 2012-08-26 | Disposition: A | Discharge status: Home / Self Care | Payer: Medicaid Other | Source: Home / Self Care

## 2012-08-26 ENCOUNTER — Encounter (HOSPITAL_COMMUNITY): Payer: Self-pay | Admitting: Emergency Medicine

## 2012-08-26 ENCOUNTER — Emergency Department (HOSPITAL_COMMUNITY)
Admission: EM | Admit: 2012-08-26 | Discharge: 2012-08-26 | Disposition: A | Discharge status: Home / Self Care | Payer: Medicaid Other | Attending: Emergency Medicine | Admitting: Emergency Medicine

## 2012-08-26 DIAGNOSIS — F17200 Nicotine dependence, unspecified, uncomplicated: Secondary | ICD-10-CM | POA: Insufficient documentation

## 2012-08-26 DIAGNOSIS — M545 Low back pain, unspecified: Secondary | ICD-10-CM | POA: Insufficient documentation

## 2012-08-26 DIAGNOSIS — G8929 Other chronic pain: Secondary | ICD-10-CM | POA: Insufficient documentation

## 2012-08-26 DIAGNOSIS — Z79899 Other long term (current) drug therapy: Secondary | ICD-10-CM | POA: Insufficient documentation

## 2012-08-26 HISTORY — DX: Dorsalgia, unspecified: M54.9

## 2012-08-26 HISTORY — DX: Other chronic pain: G89.29

## 2012-08-26 MED ORDER — DIAZEPAM 5 MG PO TABS: 5.0000 mg | ORAL_TABLET | Freq: Every evening | ORAL | Status: DC | PRN

## 2012-08-26 MED ORDER — HYDROCODONE-ACETAMINOPHEN 5-325 MG PO TABS: 1.0000 | ORAL_TABLET | Freq: Three times a day (TID) | ORAL | Status: DC | PRN

## 2012-08-26 MED ORDER — HYDROCODONE-ACETAMINOPHEN 5-325 MG PO TABS
1.0000 | ORAL_TABLET | Freq: Once | ORAL | Status: AC
  Administered 2012-08-26: 1 via ORAL
  Filled 2012-08-26 (×2): qty 1

## 2012-08-26 NOTE — Discharge Instructions (Signed)
1. You may take up to 800 mg of ibuprofen (this is typically 4 over the counter pills) up to three times a day for your pain. For breakthrough pain you may take your prescribed Norco. Do not take other medicines containing acetaminophen as Percocet contains acetaminophen (read the labels!). You may take Valium before bed as a muscle relaxer. Do not drink alcohol, drive, or operate heavy machinery while taking Norco or Valium. Remember to ice your injured areas for the next 24 hours, to keep elevated, and rest as possible.   2. Establish relationship with primary care doctor as discussed. A resource guide and information on the Affordable Care Act has been provided for your information.    RESOURCE GUIDE  If you do not have a primary care doctor to follow up with regarding today's visit, please call the [name] at [phone] to make an appointment. Hours of operation are 10am - 7pm, Monday through Friday, and they have a sliding scale fee.   Insufficient Money for Medicine: Contact [employer]:  call "211" or [facility] [phone].  No Primary Care Doctor: - Call [facility]  [phone] - can help you locate a primary care doctor that  accepts your insurance, provides certain services, etc. - Physician Referral Service- [phone]  Agencies that provide inexpensive medical care: - [name]  [phone] - [name]  [phone] - [facility]  [phone] - [facility]  [phone] - [facility]  [phone] [facility]  [phone]  Medicaid-accepting [facility] Providers: - [facility]- [address]  [phone], Mon-Fri 9am-7pm, Sat 9am-1pm - [facility]- [address] [place], [address] [place]  [phone] - [facility]- [address] [place]  [phone] [facility]- [address]  [phone] - Renaye Rakers- [address] [place], [address], [phone]  Only accepts [place] patients after they have their name  applied to their card  Self Pay (no insurance) in [place]: - Sickle Cell Patients: Dr Willey Blade, [facility]  [address] [place], [phone] - [facility]- [address] [place]  [phone]       [name]- [address]       -     [facility]- see information above (Speak to [employer] if you do not have insurance)       -  [facility]- [address] [place], [phone]       -  [facility]- [address],  [phone]       -  [facility]- [address] [phone]       -  Dr Julio Sicks-  [address], [place], [phone]       -  [facility]- [address], [phone]       -  [facility]- [address], [phone], also [address], [phone]       -    [facility]- [address] [place], [phone], 1st & 3rd Saturday   every month, 10am-1pm  1) Find a Doctor and Pay Out of Pocket Although you won't have to find out who is covered by your insurance plan, it is  a good idea to ask around and get recommendations. You will then need to call the office and see if the doctor you have chosen will accept you as a new patient and what types of options they offer for patients who are self-pay. Some doctors offer discounts or will set up payment plans for their patients who do not have insurance, but you will need to ask so you aren't surprised when you get to your appointment.  2) Contact Your Local Health Department Not all health departments have doctors that can see patients for sick visits, but many do, so it is worth a call to see if yours does. If you don't know where your local health department is, you can check in your phone book. The CDC also has a tool to help you locate your state's  health department, and many state websites also have listings of all of their local health departments.  3) Find a Walk-in Clinic If your illness is not likely to be very severe or complicated, you may want to try a walk in clinic. These are popping up all over the country in pharmacies, drugstores, and shopping centers. They're usually staffed by nurse practitioners or physician assistants that have been trained to treat common illnesses and complaints. They're usually fairly quick and inexpensive. However, if you have serious medical issues or chronic medical problems, these are probably not your best option  Affordable Health Care Below is information to help you research and register for Affordable Health Care that was started on October 30, 2011. Keep in mind when comparing different insurance plan prices that you may qualify for for financial assistance to lower your costs  The same application will let you find out if you and your family members might qualify for free or low-cost coverage available through Medicaid or the [employer] (CHIP).  www.http://www.long-jenkins.com/: Where individuals can learn about the Marketplace and the upcoming benefits (including where they can find local assistance), or be connected to appropriate resources in states that are running their own Marketplace.  Health Insurance Marketplace Call Center: If you have questions, call [phone]. TTY users should call [phone], Open 24 hours a day, 7 days a week.   eP[place]oBeach.com.br: Where to get the latest resources to help spread the word about Affordable Health Care & the Marketplace. This website also has Health visitor in many different languages.    More information:   Health insurance companies selling plans through the Marketplace cant deny you coverage or charge you more due to pre-existing health conditions, and they cant charge women and men different premiums based on their  gender.  The information is all available online, but you can apply 4 ways: online, by phone, by mail, or in-person with the help of a trained assister or navigator.   Each health plan will generally offer comprehensive coverage, including a core set of essential health benefits like doctor visits, preventive care, maternity care, hospitalization, prescription drugs, and more.   No matter where you live, there will be a Marketplace in your state, offering plans from private companies where youll be able to compare your health coverage options based on price, benefits, quality, and other features important to you before you make a choice.

## 2012-08-26 NOTE — ED Notes (Signed)
Pt c/o lower back pain x 3 days since lifting carpet 3 days ago; pt sts hx of similar in past

## 2012-08-26 NOTE — ED Provider Notes (Signed)
CSN: [id number]     Arrival date & time 08/26/12  1148 History     First MD Initiated Contact with Patient 08/26/12 1230     Chief Complaint  Patient presents with  . Back Pain   (Consider location/radiation/quality/duration/timing/severity/associated sxs/prior Treatment) Patient is a 42 y.o. male presenting with back pain. The history is provided by the patient.  Back Pain Location:  Lumbar spine Quality:  Stabbing Radiates to:  R posterior upper leg Pain severity:  Severe Worse during: worse with movement, better lying down. 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 Associated symptoms: no abdominal pain, no bladder incontinence, no bowel incontinence, no chest pain, no dysuria, no fever, no headaches, no numbness, no paresthesias, no perianal numbness and no weakness   Risk factors: no hx of cancer, no lack of exercise, not obese and no recent surgery     Past Medical History  Diagnosis Date  . Chronic back pain    History reviewed. No pertinent past surgical history. History reviewed. No pertinent family history. History  Substance Use Topics  . Smoking status: Current Every Day Smoker -- 1.00 packs/day  . Smokeless tobacco: Not on file  . Alcohol Use: No    Review of Systems  Constitutional: Negative for fever and diaphoresis.  HENT: Negative for neck pain and neck stiffness.   Eyes: Negative for visual disturbance.  Respiratory: Negative for apnea, chest tightness and shortness of breath.   Cardiovascular: Negative for chest pain  and palpitations.  Gastrointestinal: Negative for nausea, vomiting, abdominal pain, diarrhea, constipation and bowel incontinence.  Genitourinary: Negative for bladder incontinence and dysuria.  Musculoskeletal: Positive for back pain. Negative for gait problem.  Skin: Negative for rash.  Neurological: Negative for dizziness, weakness, light-headedness, numbness, headaches and paresthesias.    Allergies  Review of patient's allergies indicates no known allergies.  Home Medications   Current Outpatient Rx  Name  Route  Sig  Dispense  Refill  . acetaminophen (TYLENOL) 500 MG tablet   Oral   Take 1,500 mg by mouth every 6 (six) hours as needed for pain.         Marland Kitchen albuterol (PROVENTIL HFA;VENTOLIN HFA) 108 (90 BASE) MCG/ACT inhaler   Inhalation   Inhale 1-2 puffs into the lungs every 6 (six) hours as needed for wheezing.   1 Inhaler   0    BP 122/73  Pulse 98  Temp(Src) 97.8 F (36.6 C) (Oral)  Resp 16  SpO2 97% Physical Exam  Nursing note and vitals reviewed. Constitutional: He is oriented to person, place, and time. He appears well-developed and well-nourished. No distress.  HENT:  Head: Normocephalic and atraumatic.  Eyes: Conjunctivae and EOM are normal.  Neck: Normal range of motion. Neck supple.  No meningeal signs  Cardiovascular: Normal rate, regular rhythm and normal heart sounds.  Exam reveals no gallop and no friction rub.   No murmur heard. Pulmonary/Chest: Effort normal and breath sounds normal. No respiratory distress. He has no wheezes. He has no rales. He exhibits no tenderness.  Abdominal: Soft. Bowel sounds are normal. He  exhibits no distension. There is no tenderness. There is no rebound and no guarding.  Musculoskeletal: Normal range of motion. He exhibits tenderness. He exhibits no edema.  FROM to upper and lower extremities No step-offs noted on C-spine No tenderness to palpation of the spinous processes of the C-spine, T-spine or L-spine Full range  of motion of C-spine, T-spine or L-spine Mild tenderness to palpation of the lumbar paraspinous muscles   Neurological: He is alert and oriented to person, place, and time. No cranial nerve deficit.  Speech is clear and goal oriented, follows commands Sensation normal to light touch and two point discrimination Moves extremities without ataxia, coordination intact Normal gait and balance Normal strength in upper and lower extremities bilaterally including dorsiflexion and plantar flexion, strong and equal grip strength   Skin: Skin is warm and dry. He is not diaphoretic. No erythema.    ED Course   Procedures (including critical care time)  Labs Reviewed - No data to display No results found. 1. Chronic back pain greater than 3 months duration    Discharge Medication List as of 08/26/2012  2:19 PM    START taking these medications   Details  diazepam (VALIUM) 5 MG tablet Take 1 tablet (5 mg total) by mouth at bedtime as needed for anxiety., Starting 08/26/2012, Until Discontinued, Print    HYDROcodone-acetaminophen (NORCO/VICODIN) 5-325 MG per tablet Take 1 tablet by mouth every 8 (eight) hours as needed for pain., Starting 08/26/2012, Until Discontinued, Print        MDM  Patient with back pain.  No neurological deficits and normal neuro exam.  Patient can walk but states is painful.  No loss of bowel or bladder control.  No concern for cauda equina.  No fever, night sweats, weight loss, h/o cancer, IVDU.  RICE protocol and pain medicine indicated and discussed with patient.   At this time there does not appear to be any evidence of an acute emergency medical condition and the patient appears stable for discharge with appropriate outpatient follow up.Diagnosis was discussed with patient who verbalizes understanding and is agreeable to discharge.   Glade Nurse, PA-C 08/26/12 1652

## 2012-08-27 NOTE — ED Provider Notes (Signed)
Medical screening examination/treatment/procedure(s) were performed by non-physician practitioner and as supervising physician I was immediately available for consultation/collaboration.  Doug Sou, MD 08/27/12 1014
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
## 2012-11-22 ENCOUNTER — Emergency Department (HOSPITAL_COMMUNITY): Payer: Medicaid Other

## 2012-11-22 ENCOUNTER — Encounter (HOSPITAL_COMMUNITY): Payer: Self-pay | Admitting: Emergency Medicine

## 2012-11-22 ENCOUNTER — Emergency Department (HOSPITAL_COMMUNITY)
Admission: EM | Admit: 2012-11-22 | Discharge: 2012-11-22 | Disposition: A | Discharge status: Home / Self Care | Payer: Medicaid Other | Attending: Emergency Medicine | Admitting: Emergency Medicine

## 2012-11-22 DIAGNOSIS — S42199A Fracture of other part of scapula, unspecified shoulder, initial encounter for closed fracture: Secondary | ICD-10-CM | POA: Insufficient documentation

## 2012-11-22 DIAGNOSIS — M542 Cervicalgia: Secondary | ICD-10-CM | POA: Insufficient documentation

## 2012-11-22 DIAGNOSIS — S42101A Fracture of unspecified part of scapula, right shoulder, initial encounter for closed fracture: Secondary | ICD-10-CM

## 2012-11-22 DIAGNOSIS — G8929 Other chronic pain: Secondary | ICD-10-CM | POA: Insufficient documentation

## 2012-11-22 DIAGNOSIS — IMO0001 Reserved for inherently not codable concepts without codable children: Secondary | ICD-10-CM | POA: Insufficient documentation

## 2012-11-22 DIAGNOSIS — R51 Headache: Secondary | ICD-10-CM | POA: Insufficient documentation

## 2012-11-22 DIAGNOSIS — H5710 Ocular pain, unspecified eye: Secondary | ICD-10-CM | POA: Insufficient documentation

## 2012-11-22 DIAGNOSIS — Y92410 Unspecified street and highway as the place of occurrence of the external cause: Secondary | ICD-10-CM | POA: Insufficient documentation

## 2012-11-22 DIAGNOSIS — F17200 Nicotine dependence, unspecified, uncomplicated: Secondary | ICD-10-CM | POA: Insufficient documentation

## 2012-11-22 DIAGNOSIS — M549 Dorsalgia, unspecified: Secondary | ICD-10-CM | POA: Insufficient documentation

## 2012-11-22 DIAGNOSIS — Y939 Activity, unspecified: Secondary | ICD-10-CM | POA: Insufficient documentation

## 2012-11-22 MED ORDER — PROPOFOL 10 MG/ML IV BOLUS
INTRAVENOUS | Status: AC
  Filled 2012-11-22: qty 20

## 2012-11-22 MED ORDER — FENTANYL CITRATE 0.05 MG/ML IJ SOLN
50.0000 ug | Freq: Once | INTRAMUSCULAR | Status: AC
  Administered 2012-11-22: 50 ug via INTRAVENOUS
  Filled 2012-11-22: qty 2

## 2012-11-22 MED ORDER — PROPOFOL 10 MG/ML IV BOLUS: 100.0000 mg | Freq: Once | INTRAVENOUS | Status: DC

## 2012-11-22 MED ORDER — HYDROCODONE-ACETAMINOPHEN 5-325 MG PO TABS
2.0000 | ORAL_TABLET | Freq: Once | ORAL | Status: AC
  Administered 2012-11-22: 2 via ORAL
  Filled 2012-11-22: qty 2

## 2012-11-22 MED ORDER — HYDROCODONE-ACETAMINOPHEN 5-325 MG PO TABS: 1.0000 | ORAL_TABLET | Freq: Four times a day (QID) | ORAL | Status: DC | PRN

## 2012-11-22 MED ORDER — HYDROMORPHONE HCL PF 1 MG/ML IJ SOLN
INTRAMUSCULAR | Status: AC
  Administered 2012-11-22: 1 mg via INTRAVENOUS
  Filled 2012-11-22: qty 1

## 2012-11-22 MED ORDER — DIAZEPAM 5 MG PO TABS
5.0000 mg | ORAL_TABLET | Freq: Once | ORAL | Status: AC
  Administered 2012-11-22: 5 mg via ORAL
  Filled 2012-11-22: qty 1

## 2012-11-22 MED ORDER — HYDROMORPHONE HCL PF 1 MG/ML IJ SOLN
1.0000 mg | Freq: Once | INTRAMUSCULAR | Status: AC
  Administered 2012-11-22: 1 mg via INTRAVENOUS

## 2012-11-22 MED ORDER — KETAMINE HCL 50 MG/ML IJ SOLN
100.0000 mg | Freq: Once | INTRAMUSCULAR | Status: DC
  Filled 2012-11-22: qty 2
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
## 2012-11-22 NOTE — ED Notes (Signed)
Propofol and Ketamine at [facility]

## 2012-11-22 NOTE — ED Notes (Signed)
Patient discharged to home with family. NAD.  

## 2012-11-22 NOTE — ED Notes (Signed)
Dr. Nanavati at BS ?

## 2012-11-22 NOTE — ED Notes (Signed)
Consent form signed.

## 2012-11-22 NOTE — ED Notes (Signed)
Ortho tech present. 

## 2012-11-22 NOTE — ED Notes (Signed)
Dr. Rhunette Croft into room,shoulder not dislocated. Conscious sedation cancelled. Pain med ordered. C/o 10/10 pain.

## 2012-11-22 NOTE — ED Notes (Signed)
[employer] paged, pt to xray.

## 2012-11-22 NOTE — ED Notes (Signed)
Pt in xray
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
## 2012-11-22 NOTE — ED Notes (Signed)
Remains in x-ray  

## 2015-11-08 ENCOUNTER — Emergency Department (HOSPITAL_COMMUNITY): Payer: Medicaid Other

## 2015-11-08 ENCOUNTER — Observation Stay (HOSPITAL_COMMUNITY)
Admission: EM | Admit: 2015-11-08 | Discharge: 2015-11-09 | Disposition: A | Discharge status: Home / Self Care | Payer: Medicaid Other | Attending: Family Medicine | Admitting: Family Medicine

## 2015-11-08 ENCOUNTER — Encounter (HOSPITAL_COMMUNITY): Payer: Self-pay | Admitting: Emergency Medicine

## 2015-11-08 DIAGNOSIS — K838 Other specified diseases of biliary tract: Secondary | ICD-10-CM | POA: Diagnosis not present

## 2015-11-08 DIAGNOSIS — R7989 Other specified abnormal findings of blood chemistry: Secondary | ICD-10-CM | POA: Diagnosis not present

## 2015-11-08 DIAGNOSIS — R112 Nausea with vomiting, unspecified: Secondary | ICD-10-CM | POA: Diagnosis present

## 2015-11-08 DIAGNOSIS — R1084 Generalized abdominal pain: Secondary | ICD-10-CM | POA: Diagnosis present

## 2015-11-08 DIAGNOSIS — I700 Atherosclerosis of aorta: Secondary | ICD-10-CM | POA: Diagnosis not present

## 2015-11-08 DIAGNOSIS — G8929 Other chronic pain: Secondary | ICD-10-CM | POA: Diagnosis not present

## 2015-11-08 DIAGNOSIS — F17210 Nicotine dependence, cigarettes, uncomplicated: Secondary | ICD-10-CM | POA: Insufficient documentation

## 2015-11-08 DIAGNOSIS — K529 Noninfective gastroenteritis and colitis, unspecified: Secondary | ICD-10-CM | POA: Diagnosis not present

## 2015-11-08 DIAGNOSIS — R1033 Periumbilical pain: Secondary | ICD-10-CM | POA: Diagnosis not present

## 2015-11-08 DIAGNOSIS — F1120 Opioid dependence, uncomplicated: Secondary | ICD-10-CM | POA: Insufficient documentation

## 2015-11-08 DIAGNOSIS — R109 Unspecified abdominal pain: Secondary | ICD-10-CM | POA: Diagnosis present

## 2015-11-08 DIAGNOSIS — K760 Fatty (change of) liver, not elsewhere classified: Secondary | ICD-10-CM | POA: Insufficient documentation

## 2015-11-08 DIAGNOSIS — M549 Dorsalgia, unspecified: Secondary | ICD-10-CM | POA: Diagnosis not present

## 2015-11-08 HISTORY — DX: Opioid dependence, uncomplicated: F11.20

## 2015-11-08 LAB — COMPREHENSIVE METABOLIC PANEL
ALT: 76 U/L — ABNORMAL HIGH (ref 17–63)
AST: 57 U/L — ABNORMAL HIGH (ref 15–41)
Albumin: 4 g/dL (ref 3.5–5.0)
Alkaline Phosphatase: 75 U/L (ref 38–126)
Anion gap: 9 (ref 5–15)
BILIRUBIN TOTAL: 0.5 mg/dL (ref 0.3–1.2)
BUN: 15 mg/dL (ref 6–20)
CALCIUM: 9.3 mg/dL (ref 8.9–10.3)
CHLORIDE: 108 mmol/L (ref 101–111)
CO2: 21 mmol/L — AB (ref 22–32)
Creatinine, Ser: 0.84 mg/dL (ref 0.61–1.24)
Glucose, Bld: 132 mg/dL — ABNORMAL HIGH (ref 65–99)
POTASSIUM: 3.6 mmol/L (ref 3.5–5.1)
Sodium: 138 mmol/L (ref 135–145)
TOTAL PROTEIN: 7.3 g/dL (ref 6.5–8.1)

## 2015-11-08 LAB — URINALYSIS, ROUTINE W REFLEX MICROSCOPIC
Bilirubin Urine: NEGATIVE
Glucose, UA: NEGATIVE mg/dL
Hgb urine dipstick: NEGATIVE
Ketones, ur: 15 mg/dL — AB
LEUKOCYTES UA: NEGATIVE
NITRITE: NEGATIVE
Protein, ur: NEGATIVE mg/dL
SPECIFIC GRAVITY, URINE: 1.038 — AB (ref 1.005–1.030)
pH: 7.5 (ref 5.0–8.0)

## 2015-11-08 LAB — CBC
HCT: 43.1 % (ref 39.0–52.0)
HEMOGLOBIN: 15.7 g/dL (ref 13.0–17.0)
MCH: 30.5 pg (ref 26.0–34.0)
MCHC: 36.4 g/dL — ABNORMAL HIGH (ref 30.0–36.0)
MCV: 83.9 fL (ref 78.0–100.0)
Platelets: 244 10*3/uL (ref 150–400)
RBC: 5.14 MIL/uL (ref 4.22–5.81)
RDW: 12.8 % (ref 11.5–15.5)
WBC: 13 10*3/uL — ABNORMAL HIGH (ref 4.0–10.5)

## 2015-11-08 LAB — LIPASE, BLOOD: LIPASE: 24 U/L (ref 11–51)

## 2015-11-08 LAB — I-STAT CG4 LACTIC ACID, ED: LACTIC ACID, VENOUS: 0.89 mmol/L (ref 0.5–1.9)

## 2015-11-08 MED ORDER — IBUPROFEN 200 MG PO TABS
600.0000 mg | ORAL_TABLET | Freq: Four times a day (QID) | ORAL | Status: DC | PRN
Start: 2015-11-08 — End: 2015-11-09
  Administered 2015-11-09: 600 mg via ORAL
  Filled 2015-11-08: qty 3

## 2015-11-08 MED ORDER — ONDANSETRON HCL 4 MG/2ML IJ SOLN
4.0000 mg | Freq: Once | INTRAMUSCULAR | Status: AC | PRN
  Administered 2015-11-08: 4 mg via INTRAVENOUS
  Filled 2015-11-08: qty 2

## 2015-11-08 MED ORDER — INFLUENZA VAC SPLIT QUAD 0.5 ML IM SUSY
0.5000 mL | PREFILLED_SYRINGE | INTRAMUSCULAR | Status: DC
  Filled 2015-11-08: qty 0.5

## 2015-11-08 MED ORDER — OXYCODONE HCL 5 MG PO TABS
5.0000 mg | ORAL_TABLET | Freq: Four times a day (QID) | ORAL | Status: DC | PRN
  Administered 2015-11-08 – 2015-11-09 (×2): 5 mg via ORAL
  Filled 2015-11-08 (×2): qty 1

## 2015-11-08 MED ORDER — DICYCLOMINE HCL 10 MG/ML IM SOLN
20.0000 mg | Freq: Once | INTRAMUSCULAR | Status: AC
  Administered 2015-11-08: 20 mg via INTRAMUSCULAR
  Filled 2015-11-08: qty 2

## 2015-11-08 MED ORDER — PNEUMOCOCCAL VAC POLYVALENT 25 MCG/0.5ML IJ INJ
0.5000 mL | INJECTION | INTRAMUSCULAR | Status: DC
  Filled 2015-11-08: qty 0.5

## 2015-11-08 MED ORDER — ACETAMINOPHEN 325 MG PO TABS: 650.0000 mg | ORAL_TABLET | Freq: Four times a day (QID) | ORAL | Status: DC | PRN

## 2015-11-08 MED ORDER — ENOXAPARIN SODIUM 40 MG/0.4ML ~~LOC~~ SOLN
40.0000 mg | Freq: Every day | SUBCUTANEOUS | Status: DC
  Administered 2015-11-08: 40 mg via SUBCUTANEOUS
  Filled 2015-11-08: qty 0.4

## 2015-11-08 MED ORDER — SODIUM CHLORIDE 0.9 % IV BOLUS (SEPSIS)
1000.0000 mL | Freq: Once | INTRAVENOUS | Status: AC
  Administered 2015-11-08: 1000 mL via INTRAVENOUS

## 2015-11-08 MED ORDER — ONDANSETRON HCL 4 MG PO TABS: 4.0000 mg | ORAL_TABLET | Freq: Four times a day (QID) | ORAL | Status: DC | PRN

## 2015-11-08 MED ORDER — IOPAMIDOL (ISOVUE-300) INJECTION 61%
100.0000 mL | Freq: Once | INTRAVENOUS | Status: AC | PRN
  Administered 2015-11-08: 100 mL via INTRAVENOUS

## 2015-11-08 MED ORDER — ACETAMINOPHEN 650 MG RE SUPP: 650.0000 mg | Freq: Four times a day (QID) | RECTAL | Status: DC | PRN

## 2015-11-08 MED ORDER — ONDANSETRON HCL 4 MG/2ML IJ SOLN: 4.0000 mg | Freq: Four times a day (QID) | INTRAMUSCULAR | Status: DC | PRN

## 2015-11-08 MED ORDER — GI COCKTAIL ~~LOC~~
30.0000 mL | Freq: Once | ORAL | Status: AC
  Administered 2015-11-08: 30 mL via ORAL
  Filled 2015-11-08: qty 30

## 2015-11-08 MED ORDER — HYDROMORPHONE HCL 1 MG/ML IJ SOLN
1.0000 mg | Freq: Once | INTRAMUSCULAR | Status: AC
  Administered 2015-11-08: 1 mg via INTRAVENOUS
  Filled 2015-11-08: qty 1

## 2015-11-08 MED ORDER — HYDROMORPHONE HCL 1 MG/ML IJ SOLN
1.0000 mg | INTRAMUSCULAR | Status: DC | PRN
  Administered 2015-11-09 (×4): 1 mg via INTRAVENOUS
  Filled 2015-11-08 (×4): qty 1

## 2015-11-08 NOTE — ED Triage Notes (Signed)
Per EMS, patient ate crab legs/sea food last night. Patient woke up at 2 am with abdominal cramping and emesis. Denies diarrhea. Patient received 4 mg of zofran. Hx of opoid addiction. Currently weening off of methadone.

## 2015-11-08 NOTE — ED Notes (Signed)
Back from CT

## 2015-11-08 NOTE — ED Notes (Signed)
20 min timer started. 9:34pm

## 2015-11-08 NOTE — Progress Notes (Signed)
Patient reports he has Medicaid insurance without a pcp.  [facility] with [facility] of providers who accept [place].  Encouraged patient to establish care as soon as possible for follow up.  Patient thankful for resources.  No further EDCM needs at this time.

## 2015-11-08 NOTE — ED Notes (Signed)
In CT

## 2015-11-08 NOTE — H&P (Signed)
History and Physical  Patient Name: Nicholas [name]     ZOX:[id number]    DOB: 12/08/1970    DOA: 11/08/2015 PCP: No PCP Per Patient   Patient coming from: Home  Chief Complaint: Epigastric pain and vomiting  HPI: Nicholas [name] is a 45 y.o. male with no significant past medical history who presents with 1 day epigastric pain and vomiting.  The patient was in his usual state of health until last night when he came home in the evening, made himself a "concoction" of black-eyed peas, beef stew, tuna, apples, and bread before bed.  This morning at [facility], he woke with severe epigastric pain and nausea, went to the bathroom and had several episodes of NBNB emesis.  He has no relief of the pain, which persisted all day, was severe in character, epigastric and periumbilical, and not improved with Pepto-Bismol or juice. Finally this evening when the pain did not subside he came to the emergency room.  ED course: -Afebrile, heart rate 70s, respirations 20, blood pressure 153/77, pulse oximetry normal -Na 138, K 3.6, Cr 0.8, WBC 13K, Hgb 15.4, lipase normal, lactate normal -AST/ALT 57 and 76 respectively -CT of the abdomen and pelvis with contrast was obtained that showed no pancreatitis, SBO or renal stone but did show a minimally dilated 7mm CBD with recommendation for follow up MRCP -He was given pain and nausea medication and TRH were asked to evaluate for admission     ROS: Review of Systems  Constitutional: Negative for chills and fever.  Gastrointestinal: Positive for abdominal pain (epigastric), nausea and vomiting. Negative for blood in stool, diarrhea and melena.  All other systems reviewed and are negative.         Past Medical History:  Diagnosis Date  . Chronic back pain   . Opiate addiction (HCC)     History reviewed. No pertinent surgical history.  Social History: Patient lives with his girlfriend and two children.  The patient walks unassisted. He smokes.  He works in  [employer].  He is from [place].    No Known Allergies  Family history: family history includes Alcoholism in his father; Heart Problems in his father; Hernia in his father; Intracerebral hemorrhage in his maternal grandmother; Testicular cancer in his father.  Prior to Admission medications   Medication Sig Start Date End Date Taking? Authorizing Provider  bismuth subsalicylate (PEPTO BISMOL) 262 MG/15ML suspension Take 30 mLs by mouth every 6 (six) hours as needed (for GI upset).   Yes Historical Provider, MD  Dextrose-Fructose-Sod Citrate Loletha Carrow(NAUZENE) [phone] MG CHEW Chew 2 each by mouth as needed (for nausea/vomiting).    Yes Historical Provider, MD       Physical Exam: BP 124/73 (BP Location: Right Arm)   Pulse 92   Temp 98.3 F (36.8 C) (Oral)   Resp 16   Ht 6' (1.829 m)   Wt 89.4 kg (197 lb)   SpO2 98%   BMI 26.72 kg/m  General appearance: Well-developed, adult male, alert and in moderate distress from pain and nausea.   Eyes: Anicteric, conjunctiva pink, lids and lashes normal. PERRL.    ENT: No nasal deformity, discharge, epistaxis.  Hearing normal. OP dry without lesions.   Neck: No neck masses.  Trachea midline.  No thyromegaly/tenderness. Lymph: No cervical or supraclavicular lymphadenopathy. Skin: Warm and dry.  No jaundice.  No suspicious rashes or lesions. Cardiac: RRR, nl S1-S2, no murmurs appreciated.  Capillary refill is brisk.  JVP normal.  No LE  edema.  Radial and DP pulses 2+ and symmetric. Respiratory: Normal respiratory rate and rhythm.  CTAB without rales or wheezes. Abdomen: Abdomen soft.  Moderate diffuse TTP, worst in epigastrium and periumbilical and RUQ without guarding or rebound. No ascites, distension, hepatosplenomegaly.   MSK: No deformities or effusions.  No cyanosis or clubbing. Neuro: Cranial nerves normal.  Sensation intact to light touch. Speech is fluent.  Muscle strength normal.    Psych: Sensorium intact and responding to  questions, attention normal.  Behavior appropriate.  Affect normal.  Judgment and insight appear normal.     Labs on Admission:  I have personally reviewed following labs and imaging studies: CBC:  Recent Labs Lab 11/08/15 1546  WBC 13.0*  HGB 15.7  HCT 43.1  MCV 83.9  PLT 244   Basic Metabolic Panel:  Recent Labs Lab 11/08/15 1546  NA 138  K 3.6  CL 108  CO2 21*  GLUCOSE 132*  BUN 15  CREATININE 0.84  CALCIUM 9.3   GFR: Estimated Creatinine Clearance: 119.3 mL/min (by C-G formula based on SCr of 0.84 mg/dL).  Liver Function Tests:  Recent Labs Lab 11/08/15 1546  AST 57*  ALT 76*  ALKPHOS 75  BILITOT 0.5  PROT 7.3  ALBUMIN 4.0    Recent Labs Lab 11/08/15 1546  LIPASE 24   No results for input(s): AMMONIA in the last 168 hours. Coagulation Profile: No results for input(s): INR, PROTIME in the last 168 hours. Cardiac Enzymes: No results for input(s): CKTOTAL, CKMB, CKMBINDEX, TROPONINI in the last 168 hours. BNP (last 3 results) No results for input(s): PROBNP in the last 8760 hours. HbA1C: No results for input(s): HGBA1C in the last 72 hours. CBG: No results for input(s): GLUCAP in the last 168 hours. Lipid Profile: No results for input(s): CHOL, HDL, LDLCALC, TRIG, CHOLHDL, LDLDIRECT in the last 72 hours. Thyroid Function Tests: No results for input(s): TSH, T4TOTAL, FREET4, T3FREE, THYROIDAB in the last 72 hours. Anemia Panel: No results for input(s): VITAMINB12, FOLATE, FERRITIN, TIBC, IRON, RETICCTPCT in the last 72 hours. Sepsis Labs: Lactic acid 0.89 Invalid input(s): PROCALCITONIN, LACTICIDVEN No results found for this or any previous visit (from the past 240 hour(s)).       Radiological Exams on Admission: Personally reviewed following reports: Ct Abdomen Pelvis W Contrast  Result Date: 11/08/2015 CLINICAL DATA:  Abdominal cramping and emesis EXAM: CT ABDOMEN AND PELVIS WITH CONTRAST TECHNIQUE: Multidetector CT imaging of the  abdomen and pelvis was performed using the standard protocol following bolus administration of intravenous contrast. CONTRAST:  ISOVUE-300 IOPAMIDOL (ISOVUE-300) INJECTION 61% COMPARISON:  Radiograph 02/01/2012.  CT scan report 05/19/2002 FINDINGS: Lower chest: Mild dependent atelectasis. No acute infiltrate or effusion at the lung bases. Hepatobiliary: Minimal intra hepatic biliary dilatation. Gallbladder demonstrates no stones or sludge. There is mild extrahepatic biliary dilatation with common bile duct measuring 9 mm. No calcified stones are visualized. Pancreas: No peripancreatic inflammation. Pancreatic duct is dilated up to 7 mm. Spleen: Normal in size without focal abnormality. Adrenals/Urinary Tract: Nodular enlargement of the left adrenal gland. Right adrenal gland within normal limits. Right kidney is within normal limits. Tiny cortical posterior hypodensity mid left kidney too small to further characterize. Urinary bladder is normal. Stomach/Bowel: Stomach is nondilated. Borderline dilated fluid-filled loop of bowel in the left heavy abdomen, measuring up to 3.1 cm. Collapsed small bowel elsewhere. Few additional fluid-filled loops of small bowel, nonspecific within the anterior midline abdomen. Large amount of stool in the right colon. The  appendix is visualized and is within normal limits. 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 Vascular/Lymphatic: Atherosclerotic vascular calcifications of the aorta. 1.2 cm periportal lymph node. Nonspecific sub cm retroperitoneal lymph nodes. Reproductive: Mild prostate gland calcification. Other: No free air or free fluid. Musculoskeletal: Degenerative changes. No acute osseous abnormality. IMPRESSION: 1. Mild intra hepatic and extra hepatic biliary dilatation with dilated pancreatic duct, stricture or ampullary lesion cannot be excluded. Recommend clinical correlation with laboratory values. Consider MR or ERCP for further evaluation. 2.  Few borderline dilated loops of small bowel in the left hemi abdomen, nonspecific and could relate to a focal ileus. No definitive bowel obstruction identified at this time. CT follow-up may be performed depending upon the clinical course. 3. Nonspecific periportal lymph node. Electronically Signed   By: Jasmine Pang M.D.   On: 11/08/2015 20:39        Assessment/Plan Principal Problem:   Abdominal pain Active Problems:   Dilated bile duct   Nausea & vomiting  1. Abdominal pain, elevated LFTs and dilated CBD:  Concern for common bile duct stone.   -NPO -MIVF -MRCP of the abdomen ordered -RUQ [place] ordered -Trend LFTs tomorrow -Hydromorphone or acetaminophen for pain    2. Nausea and vomiting:  -Ondansetron for vomiting      DVT prophylaxis: Lovenox  Code Status: FULL  Family Communication: Girlfriend at bedsdie  Disposition Plan: Anticipate IV fluids and NPO status overnight, with follow up RUQ [place] and MRCP tomorrow.  If stone present in CBD, will consult to GI, otherwise discharge to home with symptomatic cares. Consults called: None overnight Admission status: OBS, med surg At the point of initial evaluation, it is my clinical opinion that admission for OBSERVATION is reasonable and necessary because the patient's presenting complaints in the context of their chronic conditions represent sufficient risk of deterioration or significant morbidity to constitute reasonable grounds for close observation in the hospital setting, but that the patient may be medically stable for discharge from the hospital within 24 to 48 hours.    Medical decision making: Patient seen at 10:00 PM on 11/08/2015.  The patient was discussed with Orlean Bradford, PA-C.  What exists of the patient's chart was reviewed in depth and summarized above.  Clinical condition: stable.        Alberteen Sam [facility] Pager [phone]

## 2015-11-08 NOTE — ED Notes (Signed)
PA at bedside.

## 2015-11-08 NOTE — ED Provider Notes (Signed)
[facility] DEPT Provider Note   CSN: [id number] Arrival date & time: 11/08/15  1423     History   Chief Complaint Chief Complaint  Patient presents with  . Abdominal Pain    HPI Nicholas [name] is a 45 y.o. male.  Patient is 46 yo M with PMH of chronic back pain and opiate addiction, no hx of abdominal surgeries, presenting with acute episode of generalized abdominal pain starting at 5AM this morning. States pain woke him up out of sleep, and he describes the pain as "cramping" and located diffusely throughout his abdomen. Pain has been worsening throughout the day, with associated nausea and vomiting since onset. States he's been unable to tolerate anything PO, and did not take his methadone today. He denies any fever, chills, diarrhea, constipation, change in bowel habits, melena, hematochezia, or dysuria. States he ate a can of tuna last night, and that "it may have been expired." No other sick contacts at home. No alcohol or recent drug use.      Past Medical History:  Diagnosis Date  . Chronic back pain   . Opiate addiction ([place])     There are no active problems to display for this patient.   History reviewed. No pertinent surgical history.     Home Medications    Prior to Admission medications   Medication Sig Start Date End Date Taking? Authorizing Provider  bismuth subsalicylate (PEPTO BISMOL) 262 MG/15ML suspension Take 30 mLs by mouth every 6 (six) hours as needed (for GI upset).   Yes Historical Provider, MD  Dextrose-Fructose-Sod Citrate Almon Hercules) [phone] MG CHEW Chew 2 each by mouth as needed (for nausea/vomiting).    Yes Historical Provider, MD    Family History No family history on file.  Social History Social History  Substance Use Topics  . Smoking status: Current Every Day Smoker    Packs/day: 1.00  . Smokeless tobacco: Never Used  . Alcohol use Yes     Allergies   Review of patient's allergies indicates no known  allergies.   Review of Systems Review of Systems  Constitutional: Negative for chills and fever.  HENT: Negative for ear pain and sore throat.   Eyes: Negative for pain and visual disturbance.  Respiratory: Negative for cough and shortness of breath.   Cardiovascular: Negative for chest pain, palpitations and leg swelling.  Gastrointestinal: Positive for abdominal pain, nausea and vomiting. Negative for blood in stool, constipation and diarrhea.  Genitourinary: Negative for dysuria, flank pain and hematuria.  Musculoskeletal: Positive for back pain (chronic). Negative for neck pain.  Skin: Negative for color change and rash.  Neurological: Negative for dizziness, seizures, syncope, weakness, numbness and headaches.     Physical Exam Updated Vital Signs BP 141/82   Pulse 68   Temp 98.4 F (36.9 C) (Oral)   Resp 20   Ht 6' (1.829 m)   Wt 89.4 kg   SpO2 98%   BMI 26.72 kg/m   Physical Exam  Constitutional:  WDWN male, writhing in pain on stretcher  HENT:  Head: Normocephalic and atraumatic.  Mouth/Throat: Oropharynx is clear and moist.  Eyes: Conjunctivae are normal.  Neck: Normal range of motion.  Cardiovascular: Normal rate, regular rhythm, normal heart sounds and intact distal pulses.   Pulmonary/Chest: Effort normal and breath sounds normal. No respiratory distress.  Abdominal: Soft. Bowel sounds are normal. He exhibits no distension. There is tenderness (diffuse TTP in all 4 quadrants). There is guarding.  Unable to accurately assess for Murphy's  sign or McBurney's point tenderness due to patient's guarding   Musculoskeletal: Normal range of motion. He exhibits no edema or tenderness.  Neurological: He is alert.  Skin: Skin is warm and dry.  Psychiatric: He has a normal mood and affect.  Nursing note and vitals reviewed.    ED Treatments / Results  Labs (all labs ordered are listed, but only abnormal results are displayed) Labs Reviewed  COMPREHENSIVE  METABOLIC PANEL - Abnormal; Notable for the following:       Result Value   CO2 21 (*)    Glucose, Bld 132 (*)    AST 57 (*)    ALT 76 (*)    All other components within normal limits  CBC - Abnormal; Notable for the following:    WBC 13.0 (*)    MCHC 36.4 (*)    All other components within normal limits  LIPASE, BLOOD  URINALYSIS, ROUTINE W REFLEX MICROSCOPIC (NOT AT [facility])  I-STAT CG4 LACTIC ACID, ED    EKG  EKG Interpretation None       Radiology No results found.  Procedures Procedures (including critical care time)  Medications Ordered in ED Medications  gi cocktail (Maalox,Lidocaine,Donnatal) (not administered)  dicyclomine (BENTYL) injection 20 mg (not administered)  sodium chloride 0.9 % bolus 1,000 mL (not administered)  ondansetron (ZOFRAN) injection 4 mg (4 mg Intravenous Given 11/08/15 1817)     Initial Impression / Assessment and Plan / ED Course  I have reviewed the triage vital signs and the nursing notes.  Pertinent labs & imaging results that were available during my care of the patient were reviewed by me and considered in my medical decision making (see chart for details).  Clinical Course   Patient is 45 yo M with PMH of chronic back pain and opiate addiction, presenting with acute episode of generalized abdominal pain and vomiting since 5AM this morning. No surgical hx. Patient is afebrile and VSS, but patient is writhing in pain. Abdomen soft, but tender in all 4 quadrants, and unable to appropriately assess for Murphy's or McBurney's point tenderness. Initial labs including CMP, CBC, lactic acid, lipase, and urinalysis ordered. CT abdomen ordered to r/o appendicitis, cholecystitis, AAA, bowel obstruction, mesenteric ischemia, or acute abdominal pathology. Given concerns for opioid abuse, initially given Bentyl, GI cocktail, Zofran, and IVF for pain and nausea. However, pain on reassessment was 10/10, and 1 mg Dilaudid ordered.  CMP shows AST 57 and  ALT of 76, but normal alk phos and bili. CBC shows mild leukocytosis of 13.0, but otherwise unremarkable. Urinalysis shows high specific gravity. Normal lactic acid and lipase. CT abdomen shows mild intra and extra hepatic biliary dilation with dilated pancreatic duct, and hospitalist consult made by Jeannett Senior, PA-C for further GI workup and possible MRCP or ERCP. Admitting physician, Dr. Loleta Books, agreed to admit patient, and patient notified of findings and plan for admission. Patient is agreeable to plan, but pain remained 9/10, and another liter NS and 1 mg Dilaudid ordered.    Final Clinical Impressions(s) / ED Diagnoses   Final diagnoses:  Generalized abdominal pain    New Prescriptions New Prescriptions   No medications on file     Rosilyn Mings II, [place] 11/08/15 2116    Margette Fast, MD 11/09/15 1308

## 2015-11-08 NOTE — ED Provider Notes (Signed)
Patient seen and examined. Patient with acute onset of lower abdominal pain, pain started this morning, pain is generalized, associated nausea and vomiting. Patient is admitted inpatient, but states he was unable to keep his methadone down this morning because of vomiting. Denies any prior abdominal issues. He did have his methadone yesterday. On exam, patient diffusely tender, with some voluntary guarding. Patient is not actively vomiting while I was in the room. We'll treat pain, emesis, CT scan pending. Blood work showed slightly elevated white count of 13, mildly elevated LFTs.  CT showing slight dilatation in intra and extrahepatic duct and pancreatic duct. Pt's LFTs are slightly bumped. Spoke with [facility], will admit for possible MRCP and further treatment.   Vitals:   11/08/15 1433 11/08/15 1436 11/08/15 1819 11/08/15 1909  BP: 133/77  141/82 128/64  Pulse: 75  68 67  Resp: 24  20 15   Temp: 98.4 F (36.9 C)     TempSrc: Oral     SpO2: 100%  98% 100%  Weight: 89.4 kg 89.4 kg    Height: 6' (1.829 m) 6' (1.829 m)        Jaynie Crumbleatyana Samarion Ehle, PA-C 11/08/15 2110    Maia PlanJoshua G Long, MD 11/09/15 1308

## 2015-11-09 ENCOUNTER — Observation Stay (HOSPITAL_COMMUNITY): Payer: Medicaid Other

## 2015-11-09 DIAGNOSIS — R1033 Periumbilical pain: Secondary | ICD-10-CM | POA: Diagnosis not present

## 2015-11-09 LAB — COMPREHENSIVE METABOLIC PANEL
ALBUMIN: 3.3 g/dL — AB (ref 3.5–5.0)
ALK PHOS: 60 U/L (ref 38–126)
ALT: 65 U/L — AB (ref 17–63)
AST: 50 U/L — AB (ref 15–41)
Anion gap: 5 (ref 5–15)
BUN: 13 mg/dL (ref 6–20)
CALCIUM: 8.4 mg/dL — AB (ref 8.9–10.3)
CHLORIDE: 110 mmol/L (ref 101–111)
CO2: 25 mmol/L (ref 22–32)
CREATININE: 0.84 mg/dL (ref 0.61–1.24)
GFR calc non Af Amer: >60 mL/min (ref >60)
GLUCOSE: 99 mg/dL (ref 65–99)
Potassium: 3.4 mmol/L — ABNORMAL LOW (ref 3.5–5.1)
SODIUM: 140 mmol/L (ref 135–145)
Total Bilirubin: 0.4 mg/dL (ref 0.3–1.2)
Total Protein: 6.1 g/dL — ABNORMAL LOW (ref 6.5–8.1)

## 2015-11-09 MED ORDER — GADOBENATE DIMEGLUMINE 529 MG/ML IV SOLN
20.0000 mL | Freq: Once | INTRAVENOUS | Status: AC | PRN
  Administered 2015-11-09: 18 mL via INTRAVENOUS

## 2015-11-09 MED ORDER — SODIUM CHLORIDE 0.9 % IV SOLN
INTRAVENOUS | Status: DC
  Administered 2015-11-09: 06:00:00 via INTRAVENOUS

## 2015-11-09 MED ORDER — HYDROMORPHONE HCL 1 MG/ML IJ SOLN: 1.0000 mg | Freq: Once | INTRAMUSCULAR | Status: DC

## 2015-11-09 NOTE — Discharge Summary (Signed)
Physician Discharge Summary  Nicholas JumperBobby D [name] ZOX:[id number] DOB: 06/18/1970 DOA: 11/08/2015  PCP: No PCP Per Patient  Admit date: 11/08/2015 Discharge date: 11/09/2015  Time spent: 22 minutes  Recommendations for Outpatient Follow-up:  1. No further opiates  Discharge Diagnoses:  Acute Gastroenteritis Principal Problem:   Abdominal pain Active Problems:   Dilated bile duct   Nausea & vomiting   Discharge Condition: good  Diet recommendation: reg  Filed Weights   11/08/15 1433 11/08/15 1436  Weight: 89.4 kg (197 lb) 89.4 kg (197 lb)    History of present illness:  45 y/o ?  with known h/o prior Opiate habituation admtted with sever en and diarr and abd pain Work up in ED showed dilated CBD and slight elevated LFT Patient had US and MRCP showing no oprganic pathology Patient tolerated a diet subsequently and was d/c with a diagnosis of Gastroenteritis from spoiled food  Discharge Exam: Vitals:   11/09/15 0528 11/09/15 1332  BP: 133/72 120/76  Pulse: 72 65  Resp: 16 16  Temp: 98 F (36.7 C) 98.1 F (36.7 C)    General: alert pleasant oriented in NAD Cardiovascular: s1 s 2no m/r/g Respiratory:  Clear Mild tenderness in abd, no n,v No rebound  Discharge Instructions   Discharge Instructions    Call MD for:  difficulty breathing, headache or visual disturbances    Complete by:  As directed    Diet - low sodium heart healthy    Complete by:  As directed    Discharge instructions    Complete by:  As directed    Stop taking opiate medications You may use OTC non-steroidal anti-inflammatory meds like ibuprofen with food for general pains   Increase activity slowly    Complete by:  As directed      Current Discharge Medication List    CONTINUE these medications which have NOT CHANGED   Details  bismuth subsalicylate (PEPTO BISMOL) 262 MG/15ML suspension Take 30 mLs by mouth every 6 (six) hours as needed (for GI upset).    Dextrose-Fructose-Sod Citrate  (NAUZENE) [phone] MG CHEW Chew 2 each by mouth as needed (for nausea/vomiting).        No Known Allergies    The results of significant diagnostics from this hospitalization (including imaging, microbiology, ancillary and laboratory) are listed below for reference.    Significant Diagnostic Studies: [place] Abdomen Complete  Result Date: 11/09/2015 CLINICAL DATA:  Abdominal pain for 2 days. EXAM: ABDOMEN ULTRASOUND COMPLETE COMPARISON:  CT abdomen pelvis 11/08/2015. FINDINGS: Gallbladder: No gallstones or wall thickening visualized. No sonographic Murphy sign noted by sonographer. Common bile duct: Diameter: 11 mm, as on 11/08/2015. Liver: No focal lesion identified. Within normal limits in parenchymal echogenicity. IVC: No abnormality visualized. Pancreas: Visualized portion unremarkable. Spleen: 11.4 cm, within normal limits. Right Kidney: Length: 13.8 cm. Echogenicity within normal limits. No mass or hydronephrosis visualized. Left Kidney: Length: 12.9 cm. Echogenicity within normal limits. No mass or hydronephrosis visualized. Abdominal aorta: No aneurysm visualized. Other findings: None. IMPRESSION: Dilated common bile duct without cause identified. Further evaluation is desired, MR abdomen without and with contrast with MRCP is recommended. Electronically Signed   By: Leanna BattlesMelinda  Blietz M.D.   On: 11/09/2015 10:05   Ct Abdomen Pelvis W Contrast  Result Date: 11/08/2015 CLINICAL DATA:  Abdominal cramping and emesis EXAM: CT ABDOMEN AND PELVIS WITH CONTRAST TECHNIQUE: Multidetector CT imaging of the abdomen and pelvis was performed using the standard protocol following bolus administration of intravenous contrast. CONTRAST:  100mL  ISOVUE-300 IOPAMIDOL (ISOVUE-300) INJECTION 61% COMPARISON:  Radiograph 02/01/2012.  CT scan report 05/19/2002 FINDINGS: Lower chest: Mild dependent atelectasis. No acute infiltrate or effusion at the lung bases. Hepatobiliary: Minimal intra hepatic biliary dilatation.  Gallbladder demonstrates no stones or sludge. There is mild extrahepatic biliary dilatation with common bile duct measuring 9 mm. No calcified stones are visualized. Pancreas: No peripancreatic inflammation. Pancreatic duct is dilated up to 7 mm. Spleen: Normal in size without focal abnormality. Adrenals/Urinary Tract: Nodular enlargement of the left adrenal gland. Right adrenal gland within normal limits. Right kidney is within normal limits. Tiny cortical posterior hypodensity mid left kidney too small to further characterize. Urinary bladder is normal. Stomach/Bowel: Stomach is nondilated. Borderline dilated fluid-filled loop of bowel in the left heavy abdomen, measuring up to 3.1 cm. Collapsed small bowel elsewhere. Few additional fluid-filled loops of small bowel, nonspecific within the anterior midline abdomen. Large amount of stool in the right colon. The appendix is visualized and is within normal limits. 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 Vascular/Lymphatic: Atherosclerotic vascular calcifications of the aorta. 1.2 cm periportal lymph node. Nonspecific sub cm retroperitoneal lymph nodes. Reproductive: Mild prostate gland calcification. Other: No free air or free fluid. Musculoskeletal: Degenerative changes. No acute osseous abnormality. IMPRESSION: 1. Mild intra hepatic and extra hepatic biliary dilatation with dilated pancreatic duct, stricture or ampullary lesion cannot be excluded. Recommend clinical correlation with laboratory values. Consider MR or ERCP for further evaluation. 2. Few borderline dilated loops of small bowel in the left hemi abdomen, nonspecific and could relate to a focal ileus. No definitive bowel obstruction identified at this time. CT follow-up may be performed depending upon the clinical course. 3. Nonspecific periportal lymph node. Electronically Signed   By: Jasmine Pang M.D.   On: 11/08/2015 20:39   Mr 3d Recon At Scanner  Result Date:  11/09/2015 CLINICAL DATA:  45 year old male with epigastric abdominal pain and vomiting. Mildly dilated common bile duct on CT and ultrasound. Normal serum bilirubin levels. EXAM: MRI ABDOMEN WITHOUT AND WITH CONTRAST (INCLUDING MRCP) TECHNIQUE: Multiplanar multisequence MR imaging of the abdomen was performed both before and after the administration of intravenous contrast. Heavily T2-weighted images of the biliary and pancreatic ducts were obtained, and three-dimensional MRCP images were rendered by post processing. CONTRAST:  18mL MULTIHANCE GADOBENATE DIMEGLUMINE 529 MG/ML IV SOLN COMPARISON:  11/09/2015 abdominal sonogram and 11/08/2015 CT abdomen/pelvis. FINDINGS: Lower chest: Clear lung bases. Hepatobiliary: Susceptibility artifact in the at midline anterior upper abdomen obscures visualization of portions of the left liver lobe. Normal liver size and configuration. Mild diffuse hepatic steatosis. No liver mass. Normal gallbladder with no cholelithiasis. Mild diffuse central intrahepatic biliary ductal dilatation. Mild diffuse common bile duct dilation (8 mm diameter). No choledocholithiasis. No biliary strictures. No biliary or ampullary mass. Pancreas: No pancreatic mass. No pancreas divisum. Mild diffuse main pancreatic duct dilation (up to 6 mm diameter). No peripancreatic fluid collections. Spleen: Normal size. No mass. Adrenals/Urinary Tract: No discrete adrenal nodules. No hydronephrosis. Malrotated left kidney. Subcentimeter simple renal cyst in the posterior lower left kidney. No suspicious renal masses. Stomach/Bowel: Grossly normal stomach. Visualized small and large bowel is normal caliber, with no bowel wall thickening. Vascular/Lymphatic: Atherosclerotic nonaneurysmal abdominal aorta. Patent portal, splenic, hepatic and renal veins. No pathologically enlarged lymph nodes in the abdomen. Other: No abdominal ascites or focal fluid collection. Musculoskeletal: No aggressive appearing focal osseous  lesions. IMPRESSION: 1. Mild diffuse central intrahepatic biliary ductal dilatation. Mild diffuse common bile duct dilatation (common bile  duct diameter 8 mm). Mild diffuse main pancreatic duct dilation. These findings are most likely secondary to the history of opiate addiction (as obtained from epic). No choledocholithiasis. No biliary strictures. No biliary or ampullary mass. Normal gallbladder with no cholelithiasis. 2. Aortic atherosclerosis. 3. Mild diffuse hepatic steatosis. Electronically Signed   By: Delbert Phenix M.D.   On: 11/09/2015 13:12   Mr Roe Coombs W/wo Cm/mrcp  Result Date: 11/09/2015 CLINICAL DATA:  45 year old male with epigastric abdominal pain and vomiting. Mildly dilated common bile duct on CT and ultrasound. Normal serum bilirubin levels. EXAM: MRI ABDOMEN WITHOUT AND WITH CONTRAST (INCLUDING MRCP) TECHNIQUE: Multiplanar multisequence MR imaging of the abdomen was performed both before and after the administration of intravenous contrast. Heavily T2-weighted images of the biliary and pancreatic ducts were obtained, and three-dimensional MRCP images were rendered by post processing. CONTRAST:  18mL MULTIHANCE GADOBENATE DIMEGLUMINE 529 MG/ML IV SOLN COMPARISON:  11/09/2015 abdominal sonogram and 11/08/2015 CT abdomen/pelvis. FINDINGS: Lower chest: Clear lung bases. Hepatobiliary: Susceptibility artifact in the at midline anterior upper abdomen obscures visualization of portions of the left liver lobe. Normal liver size and configuration. Mild diffuse hepatic steatosis. No liver mass. Normal gallbladder with no cholelithiasis. Mild diffuse central intrahepatic biliary ductal dilatation. Mild diffuse common bile duct dilation (8 mm diameter). No choledocholithiasis. No biliary strictures. No biliary or ampullary mass. Pancreas: No pancreatic mass. No pancreas divisum. Mild diffuse main pancreatic duct dilation (up to 6 mm diameter). No peripancreatic fluid collections. Spleen: Normal size. No  mass. Adrenals/Urinary Tract: No discrete adrenal nodules. No hydronephrosis. Malrotated left kidney. Subcentimeter simple renal cyst in the posterior lower left kidney. No suspicious renal masses. Stomach/Bowel: Grossly normal stomach. Visualized small and large bowel is normal caliber, with no bowel wall thickening. Vascular/Lymphatic: Atherosclerotic nonaneurysmal abdominal aorta. Patent portal, splenic, hepatic and renal veins. No pathologically enlarged lymph nodes in the abdomen. Other: No abdominal ascites or focal fluid collection. Musculoskeletal: No aggressive appearing focal osseous lesions. IMPRESSION: 1. Mild diffuse central intrahepatic biliary ductal dilatation. Mild diffuse common bile duct dilatation (common bile duct diameter 8 mm). Mild diffuse main pancreatic duct dilation. These findings are most likely secondary to the history of opiate addiction (as obtained from epic). No choledocholithiasis. No biliary strictures. No biliary or ampullary mass. Normal gallbladder with no cholelithiasis. 2. Aortic atherosclerosis. 3. Mild diffuse hepatic steatosis. Electronically Signed   By: Delbert Phenix M.D.   On: 11/09/2015 13:12    Microbiology: No results found for this or any previous visit (from the past 240 hour(s)).   Labs: Basic Metabolic Panel:  Recent Labs Lab 11/08/15 1546 11/09/15 0515  NA 138 140  K 3.6 3.4*  CL 108 110  CO2 21* 25  GLUCOSE 132* 99  BUN 15 13  CREATININE 0.84 0.84  CALCIUM 9.3 8.4*   Liver Function Tests:  Recent Labs Lab 11/08/15 1546 11/09/15 0515  AST 57* 50*  ALT 76* 65*  ALKPHOS 75 60  BILITOT 0.5 0.4  PROT 7.3 6.1*  ALBUMIN 4.0 3.3*    Recent Labs Lab 11/08/15 1546  LIPASE 24   No results for input(s): AMMONIA in the last 168 hours. CBC:  Recent Labs Lab 11/08/15 1546  WBC 13.0*  HGB 15.7  HCT 43.1  MCV 83.9  PLT 244   Cardiac Enzymes: No results for input(s): CKTOTAL, CKMB, CKMBINDEX, TROPONINI in the last 168  hours. BNP: BNP (last 3 results) No results for input(s): BNP in the last 8760 hours.  ProBNP (last 3  results) No results for input(s): PROBNP in the last 8760 hours.  CBG: No results for input(s): GLUCAP in the last 168 hours.     SignedRhetta Mura MD   [facility] 11/09/2015, 3:28 PM

## 2016-02-18 ENCOUNTER — Emergency Department (HOSPITAL_COMMUNITY): Payer: Medicaid Other

## 2016-02-18 ENCOUNTER — Emergency Department (HOSPITAL_COMMUNITY)
Admission: EM | Admit: 2016-02-18 | Discharge: 2016-02-18 | Disposition: A | Discharge status: Home / Self Care | Payer: Medicaid Other | Attending: Emergency Medicine | Admitting: Emergency Medicine

## 2016-02-18 ENCOUNTER — Encounter (HOSPITAL_COMMUNITY): Payer: Self-pay | Admitting: Emergency Medicine

## 2016-02-18 DIAGNOSIS — W19XXXA Unspecified fall, initial encounter: Secondary | ICD-10-CM

## 2016-02-18 DIAGNOSIS — F17200 Nicotine dependence, unspecified, uncomplicated: Secondary | ICD-10-CM | POA: Insufficient documentation

## 2016-02-18 DIAGNOSIS — Y9301 Activity, walking, marching and hiking: Secondary | ICD-10-CM | POA: Diagnosis not present

## 2016-02-18 DIAGNOSIS — S99912A Unspecified injury of left ankle, initial encounter: Secondary | ICD-10-CM | POA: Diagnosis present

## 2016-02-18 DIAGNOSIS — S82832A Other fracture of upper and lower end of left fibula, initial encounter for closed fracture: Secondary | ICD-10-CM | POA: Diagnosis not present

## 2016-02-18 DIAGNOSIS — W000XXA Fall on same level due to ice and snow, initial encounter: Secondary | ICD-10-CM | POA: Insufficient documentation

## 2016-02-18 DIAGNOSIS — Y999 Unspecified external cause status: Secondary | ICD-10-CM | POA: Diagnosis not present

## 2016-02-18 DIAGNOSIS — S82892A Other fracture of left lower leg, initial encounter for closed fracture: Secondary | ICD-10-CM

## 2016-02-18 DIAGNOSIS — Z79899 Other long term (current) drug therapy: Secondary | ICD-10-CM | POA: Insufficient documentation

## 2016-02-18 DIAGNOSIS — Y92410 Unspecified street and highway as the place of occurrence of the external cause: Secondary | ICD-10-CM | POA: Diagnosis not present

## 2016-02-18 MED ORDER — OXYCODONE-ACETAMINOPHEN 5-325 MG PO TABS
1.0000 | ORAL_TABLET | Freq: Once | ORAL | Status: AC
  Administered 2016-02-18: 1 via ORAL
  Filled 2016-02-18: qty 1

## 2016-02-18 MED ORDER — FENTANYL CITRATE (PF) 100 MCG/2ML IJ SOLN
50.0000 ug | INTRAMUSCULAR | Status: AC | PRN
  Administered 2016-02-18 (×2): 50 ug via INTRAVENOUS
  Filled 2016-02-18 (×2): qty 2

## 2016-02-18 MED ORDER — SODIUM CHLORIDE 0.9 % IV BOLUS (SEPSIS): 1000.0000 mL | Freq: Once | INTRAVENOUS | Status: DC

## 2016-02-18 MED ORDER — MORPHINE SULFATE (PF) 4 MG/ML IV SOLN
4.0000 mg | Freq: Once | INTRAVENOUS | Status: AC
  Administered 2016-02-18: 4 mg via INTRAVENOUS
  Filled 2016-02-18: qty 1

## 2016-02-18 MED ORDER — ONDANSETRON HCL 4 MG/2ML IJ SOLN
4.0000 mg | Freq: Once | INTRAMUSCULAR | Status: AC
  Administered 2016-02-18: 4 mg via INTRAVENOUS
  Filled 2016-02-18: qty 2

## 2016-02-18 MED ORDER — SODIUM CHLORIDE 0.9 % IV BOLUS (SEPSIS)
1000.0000 mL | Freq: Once | INTRAVENOUS | Status: AC
  Administered 2016-02-18: 1000 mL via INTRAVENOUS

## 2016-02-18 MED ORDER — OXYCODONE-ACETAMINOPHEN 5-325 MG PO TABS: 1.0000 | ORAL_TABLET | Freq: Four times a day (QID) | ORAL | 0 refills | Status: DC | PRN

## 2016-02-18 NOTE — ED Triage Notes (Signed)
Pt complaint of left ankle pain post fall on ice this morning. Swelling noted. Moderate pedal pulse.

## 2016-02-18 NOTE — ED Provider Notes (Signed)
[facility]-EMERGENCY DEPT Provider Note   CSN: [id number] Arrival date & time: 02/18/16  4098     History   Chief Complaint Chief Complaint  Patient presents with  . Ankle Injury  . Fall    HPI Nicholas [name] is a 46 y.o. male with no significant past medical history who presents with a left ankle injury. Patient reports that he was walking outside this morning at 6 AM, approximate 3 hours ago, when he slipped and fell. Patient said he fell onto his left ankle causing a cracking sound and a immediate onset of pain and deformity. Patient said his angle was "twisted and". He said that he straightened it out and presents for evaluation. Patient says his pain is an 8 out of 10 in severity and sharp. He reports some pain in his knee as well. Patient has no complaint of laceration or skin injury. Patient denies any other areas of discomfort. He denies any preceding symptoms and this was a mechanical fall on the ice.    The history is provided by the patient and medical records.  Ankle Pain   The incident occurred 3 to 5 hours ago. The incident occurred in the street. The injury mechanism was a fall. The pain is present in the left ankle. The quality of the pain is described as sharp. The pain is at a severity of 8/10. The pain is severe. The pain has been constant since onset. Associated symptoms include inability to bear weight. Pertinent negatives include no numbness, no loss of motion, no muscle weakness, no loss of sensation and no tingling. He reports no foreign bodies present. The symptoms are aggravated by bearing weight and palpation. He has tried nothing for the symptoms. The treatment provided no relief.    Past Medical History:  Diagnosis Date  . Chronic back pain   . Opiate addiction [facility])     Patient Active Problem List   Diagnosis Date Noted  . Abdominal pain 11/08/2015  . Dilated bile duct 11/08/2015  . Nausea & vomiting 11/08/2015    History reviewed. No pertinent surgical  history.     Home Medications    Prior to Admission medications   Medication Sig Start Date End Date Taking? Authorizing Provider  bismuth subsalicylate (PEPTO BISMOL) 262 MG/15ML suspension Take 30 mLs by mouth every 6 (six) hours as needed (for GI upset).    Historical Provider, MD  Dextrose-Fructose-Sod Citrate Loletha Carrow) [phone] MG CHEW Chew 2 each by mouth as needed (for nausea/vomiting).     Historical Provider, MD    Family History Family History  Problem Relation Age of Onset  . Alcoholism Father   . Testicular cancer Father   . Heart Problems Father   . Hernia Father   . Intracerebral hemorrhage Maternal Grandmother     Social History Social History  Substance Use Topics  . Smoking status: Current Some Day Smoker    Packs/day: 0.00  . Smokeless tobacco: Never Used  . Alcohol use Yes     Allergies   Patient has no known allergies.   Review of Systems Review of Systems  Constitutional: Negative for activity change, chills, diaphoresis, fatigue and fever.  HENT: Negative for congestion and rhinorrhea.   Eyes: Negative for visual disturbance.  Respiratory: Negative for cough, chest tightness, shortness of breath, wheezing and stridor.   Cardiovascular: Negative for chest pain, palpitations and leg swelling.  Gastrointestinal: Negative for abdominal distention, abdominal pain, blood in stool, constipation, diarrhea, nausea and vomiting.  Genitourinary:  Negative for difficulty urinating, dysuria and flank pain.  Musculoskeletal: Negative for back pain and gait problem.  Skin: Negative for rash and wound.  Neurological: Negative for dizziness, tingling, weakness, light-headedness, numbness and headaches.  Psychiatric/Behavioral: Negative for agitation.  All other systems reviewed and are negative.    Physical Exam Updated Vital Signs BP 142/77 (BP Location: Right Arm)   Pulse 105   Temp 98.7 F (37.1 C) (Oral)   Resp 18   Ht 6' (1.829 m)   Wt 200 lb  (90.7 kg)   SpO2 97%   BMI 27.12 kg/m   Physical Exam  Constitutional: He appears well-developed and well-nourished.  HENT:  Head: Normocephalic and atraumatic.  Eyes: Conjunctivae are normal.  Neck: Neck supple.  Cardiovascular: Normal rate and regular rhythm.   No murmur heard. Pulmonary/Chest: Effort normal and breath sounds normal. No respiratory distress.  Abdominal: Soft. There is no tenderness.  Musculoskeletal: He exhibits edema and tenderness. He exhibits no deformity.       Left foot: There is tenderness and swelling. There is normal capillary refill, no crepitus, no deformity and no laceration.       Feet:  Tenderness to palpation on the lateral angle. Normal DP and PT pulse. Normal capillary refill. Normal sensation. No laceration. Range of motion limited by pain.  Neurological: He is alert.  Skin: Skin is warm and dry.  Psychiatric: He has a normal mood and affect.  Nursing note and vitals reviewed.    ED Treatments / Results  Labs (all labs ordered are listed, but only abnormal results are displayed) Labs Reviewed - No data to display  EKG  EKG Interpretation None       Radiology Dg Ankle Complete Left  Result Date: 02/18/2016 CLINICAL DATA:  Larey Seat on the ice this morning.  Lateral ankle pain. EXAM: LEFT ANKLE COMPLETE - 3+ VIEW COMPARISON:  10/16/2010 FINDINGS: Lateral soft tissue swelling. Nondisplaced fracture of the distal fibula. No tibial fracture. No talar fracture. IMPRESSION: Nondisplaced fracture of the distal fibula. Electronically Signed   By: Paulina Fusi M.D.   On: 02/18/2016 08:54   Dg Knee Complete 4 Views Left  Result Date: 02/18/2016 CLINICAL DATA:  Fall.  Left ankle fracture.  Left knee pain. EXAM: LEFT KNEE - COMPLETE 4+ VIEW COMPARISON:  None. FINDINGS: No fracture, joint effusion, dislocation or appreciable degenerative or erosive arthropathy in the left knee. There is a non expansile centrally lytic and peripherally sclerotic osseous  lesion in the lateral left distal femoral condyle with a narrow zone of transition. IMPRESSION: 1. No left knee fracture, joint effusion or malalignment. 2. Non expansile centrally lytic and peripherally sclerotic osseous lesion in the lateral left distal femoral condyle with a narrow zone of transition, favor a benign lesion such as a fibrous cortical defect. Electronically Signed   By: Delbert Phenix M.D.   On: 02/18/2016 09:48    Procedures Procedures (including critical care time)  Medications Ordered in ED Medications  fentaNYL (SUBLIMAZE) injection 50 mcg (50 mcg Intravenous Given 02/18/16 0829)     Initial Impression / Assessment and Plan / ED Course  I have reviewed the triage vital signs and the nursing notes.  Pertinent labs & imaging results that were available during my care of the patient were reviewed by me and considered in my medical decision making (see chart for details).     PRYNCE JACOBER is a 46 y.o. male with no significant past medical history who presents with a left  ankle injury.    history and exam are seen above.  On exam, patient has tenderness and erythema to the left lateral ankle. Patient has tenderness in this location. Patient has normal DP and PT pulse. Patient has normal sensation. Range of motion is limited secondary to pain. Patient able to move his toes. Patient has some tenderness in the posterior aspect of his knee. Patient denies any pain in his hip or other locations. Lungs are clear and abdomen is nontender. No evidence of trauma to the head.  Patient had x-ray in triage show evidence of nondisplaced fracture of the distal fibula. Given the patient's knee discomfort, further x-rays will be obtained. Patient was given pain medicine and nausea medicine during workup. Anticipate speaking with orthopedics to determine disposition.  Knee x-rays showed no other fracture. Orthopedics was called and recommended cam boot, Crutches, and nonweightbearing  status. Patient will follow up with orthopedics this week. Patient understood return precautions. Patient given pain medication prescription.  Patient discharged in good condition.  Final Clinical Impressions(s) / ED Diagnoses   Final diagnoses:  Closed fracture of left ankle, initial encounter  Fall, initial encounter    New Prescriptions Discharge Medication List as of 02/18/2016  1:36 PM    START taking these medications   Details  oxyCODONE-acetaminophen (PERCOCET/ROXICET) 5-325 MG tablet Take 1 tablet by mouth every 6 (six) hours as needed for severe pain., Starting Sat 02/18/2016, Print       Clinical Impression: 1. Closed fracture of left ankle, initial encounter   2. Fall, initial encounter     Disposition: Discharge  Condition: Good  I have discussed the results, Dx and Tx plan with the pt(& family if present). He/she/they expressed understanding and agree(s) with the plan. Discharge instructions discussed at great length. Strict return precautions discussed and pt &/or family have verbalized understanding of the instructions. No further questions at time of discharge.    Discharge Medication List as of 02/18/2016  1:36 PM    START taking these medications   Details  oxyCODONE-acetaminophen (PERCOCET/ROXICET) 5-325 MG tablet Take 1 tablet by mouth every 6 (six) hours as needed for severe pain., Starting Sat 02/18/2016, Print        Follow Up: Tarry KosNaiping M Xu, MD [address] [place] [id number] [phone]  In 1 week   [facility]EM[facility] [address] [id number] [place] [id number] [phone]  If symptoms worsen     Heide Scaleshristopher J Jomel Whittlesey, MD 02/18/16 Windell [id number]

## 2016-02-18 NOTE — Discharge Instructions (Signed)
You have a fracture to your fibula in your left ankle. Please take your pain medicine as needed for the discomfort. Please follow-up with the orthopedic surgery team for further management. Please call to schedule an appointment this week with them. If any symptoms worsen, please return to the nearest emergency department.

## 2016-02-18 NOTE — ED Notes (Addendum)
Pt appears more comfortable and not fidgeting as previous assessment. Awaiting on provider to review DG results.

## 2016-02-18 NOTE — ED Notes (Signed)
PT DISCHARGED. INSTRUCTIONS AND PRESCRIPTION GIVEN. AAOX4. PT IN NO APPARENT DISTRESS. THE OPPORTUNITY TO ASK QUESTIONS WAS PROVIDED. 

## 2016-02-18 NOTE — ED Notes (Signed)
Light green and lavender tubes drawn with IV start and sent to main lab.

## 2016-02-23 ENCOUNTER — Encounter (INDEPENDENT_AMBULATORY_CARE_PROVIDER_SITE_OTHER): Payer: Self-pay | Admitting: Orthopaedic Surgery

## 2016-02-23 ENCOUNTER — Ambulatory Visit (INDEPENDENT_AMBULATORY_CARE_PROVIDER_SITE_OTHER): Payer: Medicaid Other | Admitting: Orthopaedic Surgery

## 2016-02-23 ENCOUNTER — Telehealth (INDEPENDENT_AMBULATORY_CARE_PROVIDER_SITE_OTHER): Payer: Self-pay | Admitting: Orthopaedic Surgery

## 2016-02-23 DIAGNOSIS — S8265XA Nondisplaced fracture of lateral malleolus of left fibula, initial encounter for closed fracture: Secondary | ICD-10-CM | POA: Insufficient documentation

## 2016-02-23 MED ORDER — OXYCODONE-ACETAMINOPHEN 5-325 MG PO TABS: 1.0000 | ORAL_TABLET | Freq: Two times a day (BID) | ORAL | 0 refills | Status: DC | PRN

## 2016-02-23 NOTE — Progress Notes (Signed)
   Office Visit Note   Patient: Nicholas [name]           Date of Birth: 07/19/1970           MRN: [id number] Visit Date: 02/23/2016              Requested by: No referring provider defined for this encounter. PCP: No PCP Per Patient   Assessment & Plan: Visit Diagnoses: No diagnosis found.  Plan: We'll plan on treating this nonoperatively and follow-up in 2 weeks with repeat x-rays of the left ankle.  Follow-Up Instructions: Return in about 2 weeks (around 03/08/2016).   Orders:  No orders of the defined types were placed in this encounter.  Meds ordered this encounter  Medications  . oxyCODONE-acetaminophen (PERCOCET) 5-325 MG tablet    Sig: Take 1 tablet by mouth 2 (two) times daily as needed for severe pain.    Dispense:  30 tablet    Refill:  0      Procedures: No procedures performed   Clinical Data: No additional findings.   Subjective: No chief complaint on file.   Patient is 46 year old gentleman who sustained nondisplaced fibula fracture on 02/18/2016 from mechanical fall. He's been wearing cam boot. He states pain is 10 out of 10. He endorses numbness tingling burning with the pain. He's been ambulating with crutches and nonweightbearing. Pain does not radiate    Review of Systems Complete review of systems is negative except for history of present illness  Objective: Vital Signs: There were no vitals taken for this visit.  Physical Exam  Constitutional: He is oriented to person, place, and time. He appears well-developed and well-nourished.  HENT:  Head: Normocephalic and atraumatic.  Eyes: Pupils are equal, round, and reactive to light.  Neck: Neck supple.  Pulmonary/Chest: Effort normal.  Abdominal: Soft.  Musculoskeletal: Normal range of motion.  Neurological: He is alert and oriented to person, place, and time.  Skin: Skin is warm.  Psychiatric: He has a normal mood and affect. His behavior is normal. Judgment and thought content normal.    Nursing note and vitals reviewed.   Ortho Exam Exam of the left ankle shows moderate swelling and pain with palpation over the distal fibula. No other real findings. Specialty Comments:  No specialty comments available.  Imaging: No results found.   PMFS History: Patient Active Problem List   Diagnosis Date Noted  . Abdominal pain 11/08/2015  . Dilated bile duct 11/08/2015  . Nausea & vomiting 11/08/2015   Past Medical History:  Diagnosis Date  . Chronic back pain   . Opiate addiction (HCC)     Family History  Problem Relation Age of Onset  . Alcoholism Father   . Testicular cancer Father   . Heart Problems Father   . Hernia Father   . Intracerebral hemorrhage Maternal Grandmother     No past surgical history on file. Social History   Occupational History  . Not on file.   Social History Main Topics  . Smoking status: Current Some Day Smoker    Packs/day: 0.00  . Smokeless tobacco: Never Used  . Alcohol use Yes  . Drug use: No  . Sexual activity: Not on file

## 2016-02-23 NOTE — Telephone Encounter (Signed)
NORCO 5/325. #30.  1 tab po bid prn pain

## 2016-02-23 NOTE — Telephone Encounter (Signed)
Plan does not cover Rx (Oxycodone 5-325mg ) Would you like to prescribe something else or try to do PA to see if it gets approved?  We got a fax regarding this.

## 2016-02-23 NOTE — Telephone Encounter (Signed)
I will be checking the fax machine if they faxed something to us per pt.

## 2016-02-23 NOTE — Telephone Encounter (Signed)
Pt called in, he was at pharmacy to get his RX and they wouldn't give it to him because we didn't send Medicaid something stating that the Oxycodone was necessary.   He stated they faxed us something. Please advise.

## 2016-02-24 MED ORDER — HYDROCODONE-ACETAMINOPHEN 5-325 MG PO TABS: ORAL_TABLET | ORAL | 0 refills | Status: DC

## 2016-02-24 NOTE — Telephone Encounter (Signed)
Called pt no answer [facility] new Rx is ready for pick up at the front desk. Dr Roda Shuttersxu wanted to prescribe something else instead

## 2016-02-27 ENCOUNTER — Ambulatory Visit (INDEPENDENT_AMBULATORY_CARE_PROVIDER_SITE_OTHER): Payer: Self-pay | Admitting: Orthopaedic Surgery

## 2016-03-08 ENCOUNTER — Ambulatory Visit (INDEPENDENT_AMBULATORY_CARE_PROVIDER_SITE_OTHER): Payer: Medicaid Other | Admitting: Orthopaedic Surgery

## 2016-04-22 ENCOUNTER — Emergency Department (HOSPITAL_COMMUNITY)
Admission: EM | Admit: 2016-04-22 | Discharge: 2016-04-22 | Disposition: A | Discharge status: Home / Self Care | Payer: Medicaid Other | Attending: Emergency Medicine | Admitting: Emergency Medicine

## 2016-04-22 ENCOUNTER — Emergency Department (HOSPITAL_COMMUNITY): Payer: Medicaid Other

## 2016-04-22 ENCOUNTER — Encounter (HOSPITAL_COMMUNITY): Payer: Self-pay | Admitting: *Deleted

## 2016-04-22 DIAGNOSIS — Z79899 Other long term (current) drug therapy: Secondary | ICD-10-CM | POA: Diagnosis not present

## 2016-04-22 DIAGNOSIS — F10930 Alcohol use, unspecified with withdrawal, uncomplicated: Secondary | ICD-10-CM

## 2016-04-22 DIAGNOSIS — F1123 Opioid dependence with withdrawal: Secondary | ICD-10-CM | POA: Diagnosis present

## 2016-04-22 DIAGNOSIS — F10230 Alcohol dependence with withdrawal, uncomplicated: Secondary | ICD-10-CM

## 2016-04-22 DIAGNOSIS — F1193 Opioid use, unspecified with withdrawal: Secondary | ICD-10-CM

## 2016-04-22 DIAGNOSIS — F10239 Alcohol dependence with withdrawal, unspecified: Secondary | ICD-10-CM | POA: Insufficient documentation

## 2016-04-22 DIAGNOSIS — F1910 Other psychoactive substance abuse, uncomplicated: Secondary | ICD-10-CM

## 2016-04-22 DIAGNOSIS — F17200 Nicotine dependence, unspecified, uncomplicated: Secondary | ICD-10-CM | POA: Diagnosis not present

## 2016-04-22 HISTORY — DX: Alcohol abuse, uncomplicated: F10.10

## 2016-04-22 LAB — COMPREHENSIVE METABOLIC PANEL
ALBUMIN: 3.8 g/dL (ref 3.5–5.0)
ALK PHOS: 67 U/L (ref 38–126)
ALT: 16 U/L — AB (ref 17–63)
ANION GAP: 12 (ref 5–15)
AST: 20 U/L (ref 15–41)
BILIRUBIN TOTAL: 0.6 mg/dL (ref 0.3–1.2)
BUN: 9 mg/dL (ref 6–20)
CO2: 21 mmol/L — AB (ref 22–32)
CREATININE: 0.97 mg/dL (ref 0.61–1.24)
Calcium: 9.3 mg/dL (ref 8.9–10.3)
Chloride: 103 mmol/L (ref 101–111)
GFR calc Af Amer: >60 mL/min (ref >60)
GFR calc non Af Amer: >60 mL/min (ref >60)
GLUCOSE: 105 mg/dL — AB (ref 65–99)
Potassium: 3.7 mmol/L (ref 3.5–5.1)
SODIUM: 136 mmol/L (ref 135–145)
Total Protein: 7.2 g/dL (ref 6.5–8.1)

## 2016-04-22 LAB — CBC WITH DIFFERENTIAL/PLATELET
BASOS PCT: 1 %
Basophils Absolute: 0.1 10*3/uL (ref 0.0–0.1)
EOS ABS: 0 10*3/uL (ref 0.0–0.7)
Eosinophils Relative: 0 %
HCT: 44.4 % (ref 39.0–52.0)
HEMOGLOBIN: 14.8 g/dL (ref 13.0–17.0)
LYMPHS ABS: 2.3 10*3/uL (ref 0.7–4.0)
Lymphocytes Relative: 18 %
MCH: 29.4 pg (ref 26.0–34.0)
MCHC: 33.3 g/dL (ref 30.0–36.0)
MCV: 88.1 fL (ref 78.0–100.0)
MONOS PCT: 4 %
Monocytes Absolute: 0.5 10*3/uL (ref 0.1–1.0)
NEUTROS ABS: 9.9 10*3/uL — AB (ref 1.7–7.7)
NEUTROS PCT: 77 %
Platelets: 264 10*3/uL (ref 150–400)
RBC: 5.04 MIL/uL (ref 4.22–5.81)
RDW: 13 % (ref 11.5–15.5)
WBC: 12.8 10*3/uL — AB (ref 4.0–10.5)

## 2016-04-22 LAB — POC OCCULT BLOOD, ED: FECAL OCCULT BLD: NEGATIVE

## 2016-04-22 LAB — I-STAT TROPONIN, ED
TROPONIN I, POC: 0 ng/mL (ref 0.00–0.08)
TROPONIN I, POC: 0 ng/mL (ref 0.00–0.08)

## 2016-04-22 LAB — URINALYSIS, ROUTINE W REFLEX MICROSCOPIC
BILIRUBIN URINE: NEGATIVE
Glucose, UA: NEGATIVE mg/dL
Hgb urine dipstick: NEGATIVE
Ketones, ur: 5 mg/dL — AB
Leukocytes, UA: NEGATIVE
NITRITE: NEGATIVE
Protein, ur: NEGATIVE mg/dL
SPECIFIC GRAVITY, URINE: 1.02 (ref 1.005–1.030)
pH: 7 (ref 5.0–8.0)

## 2016-04-22 LAB — LIPASE, BLOOD: Lipase: 16 U/L (ref 11–51)

## 2016-04-22 LAB — ETHANOL: Alcohol, Ethyl (B): <5 mg/dL (ref <5)

## 2016-04-22 MED ORDER — LOPERAMIDE HCL 2 MG PO CAPS: 2.0000 mg | ORAL_CAPSULE | Freq: Four times a day (QID) | ORAL | 0 refills | Status: DC | PRN

## 2016-04-22 MED ORDER — CHLORDIAZEPOXIDE HCL 25 MG PO CAPS: ORAL_CAPSULE | ORAL | 0 refills | Status: DC

## 2016-04-22 MED ORDER — ONDANSETRON HCL 4 MG/2ML IJ SOLN
4.0000 mg | Freq: Once | INTRAMUSCULAR | Status: AC
  Administered 2016-04-22: 4 mg via INTRAVENOUS
  Filled 2016-04-22: qty 2

## 2016-04-22 MED ORDER — SODIUM CHLORIDE 0.9 % IV BOLUS (SEPSIS)
1000.0000 mL | Freq: Once | INTRAVENOUS | Status: AC
  Administered 2016-04-22: 1000 mL via INTRAVENOUS

## 2016-04-22 MED ORDER — METOCLOPRAMIDE HCL 5 MG/ML IJ SOLN
10.0000 mg | Freq: Once | INTRAMUSCULAR | Status: AC
  Administered 2016-04-22: 10 mg via INTRAVENOUS
  Filled 2016-04-22: qty 2

## 2016-04-22 MED ORDER — CHLORDIAZEPOXIDE HCL 25 MG PO CAPS
50.0000 mg | ORAL_CAPSULE | Freq: Once | ORAL | Status: AC
  Administered 2016-04-22: 50 mg via ORAL
  Filled 2016-04-22: qty 2

## 2016-04-22 MED ORDER — ONDANSETRON HCL 4 MG PO TABS: 4.0000 mg | ORAL_TABLET | Freq: Four times a day (QID) | ORAL | 0 refills | Status: DC

## 2016-04-22 MED ORDER — DIAZEPAM 5 MG/ML IJ SOLN
10.0000 mg | Freq: Once | INTRAMUSCULAR | Status: AC
  Administered 2016-04-22: 10 mg via INTRAVENOUS
  Filled 2016-04-22: qty 2

## 2016-04-22 NOTE — ED Triage Notes (Signed)
Pt arrives via [facility] with c/o alcohol and opiate withdrawal. Pt states he last used alcohol at 10PM last night and had "some liquor" and hasn't had any opiates in "a couple of days". Pt endorses CP centralized nonradiating, abdominal pain, n/v/d. Pt is requesting pain medication upon arrival to ED. Had 4mg  of zofran PTA, 324 mg ASA, and 1 nitro. Pt states medicine made his pain worse.

## 2016-04-22 NOTE — ED Provider Notes (Signed)
[facility]-EMERGENCY DEPT Provider Note   CSN: [id number] Arrival date & time: 04/22/16  1746     History   Chief Complaint Chief Complaint  Patient presents with  . Withdrawal    HPI Nicholas [name] is a 46 y.o. male.  HPI  46 year old male who presents with diffuse pain and complaint of withdrawal. History of chronic pain, opiate addiction and alcohol abuse. States did not have pain medication to take 3 days ago. Since then, having diffuse pains, including chest pain, abdominal pain, back pain, extremity pains. Having nausea, vomiting, diarrhea. Having fatigue and anxiety. Drinks half a bottle of 1/5th alcohol daily and last drink at 10PM. Feels tremulous. No hallucinations. No SI/HI. No fevers or chills. Thinks he may have noticed a little blood in the diarrhea.   Past Medical History:  Diagnosis Date  . Chronic back pain   . ETOH abuse   . Opiate addiction [facility])     Patient Active Problem List   Diagnosis Date Noted  . Nondisplaced fracture of lateral malleolus of left fibula, initial encounter for closed fracture 02/23/2016  . Abdominal pain 11/08/2015  . Dilated bile duct 11/08/2015  . Nausea & vomiting 11/08/2015    History reviewed. No pertinent surgical history.     Home Medications    Prior to Admission medications   Medication Sig Start Date End Date Taking? Authorizing Provider  chlordiazePOXIDE (LIBRIUM) 25 MG capsule 50mg  PO TID x 1D, then 25-50mg  PO BID X 1D, then 25-50mg  PO QD X 1D 04/22/16   Lavera Guise, MD  HYDROcodone-acetaminophen (NORCO/VICODIN) 5-325 MG tablet TAKE 1 TAB PO BID PRN PAIN 02/24/16   Tarry Kos, MD  loperamide (IMODIUM) 2 MG capsule Take 1 capsule (2 mg total) by mouth 4 (four) times daily as needed for diarrhea or loose stools. 04/22/16   Lavera Guise, MD  ondansetron (ZOFRAN) 4 MG tablet Take 1 tablet (4 mg total) by mouth every 6 (six) hours. 04/22/16   Lavera Guise, MD  oxyCODONE-acetaminophen (PERCOCET) 5-325 MG tablet Take 1 tablet  by mouth 2 (two) times daily as needed for severe pain. 02/23/16   Tarry Kos, MD  oxyCODONE-acetaminophen (PERCOCET/ROXICET) 5-325 MG tablet Take 1 tablet by mouth every 6 (six) hours as needed for severe pain. 02/18/16   Heide Scales, MD    Family History Family History  Problem Relation Age of Onset  . Alcoholism Father   . Testicular cancer Father   . Heart Problems Father   . Hernia Father   . Intracerebral hemorrhage Maternal Grandmother     Social History Social History  Substance Use Topics  . Smoking status: Current Some Day Smoker    Packs/day: 0.00  . Smokeless tobacco: Never Used  . Alcohol use Yes     Allergies   Patient has no known allergies.   Review of Systems Review of Systems  Constitutional: Positive for fatigue. Negative for fever.  HENT: Negative for congestion and rhinorrhea.   Respiratory: Positive for cough and shortness of breath.   Cardiovascular: Positive for chest pain and palpitations. Negative for leg swelling.  Gastrointestinal: Positive for abdominal pain, blood in stool, diarrhea, nausea and vomiting.  Genitourinary: Negative for difficulty urinating.  Musculoskeletal: Positive for arthralgias, back pain and myalgias.  Allergic/Immunologic: Negative for immunocompromised state.  Neurological: Positive for headaches.  Hematological: Does not bruise/bleed easily.  Psychiatric/Behavioral: Negative for confusion.  All other systems reviewed and are negative.    Physical Exam  Updated Vital Signs BP 125/78   Pulse 61   Temp 98.8 F (37.1 C) (Oral)   Resp 20   SpO2 99%   Physical Exam Physical Exam  Nursing note and vitals reviewed. Constitutional: non-toxic, and in no acute distress Head: Normocephalic and atraumatic.  Mouth/Throat: Oropharynx is clear and dry mucous membranes.  Neck: Normal range of motion. Neck supple.  Cardiovascular: Normal rate and regular rhythm.   Pulmonary/Chest: Effort normal and breath sounds  normal.  Abdominal: Soft. There is generalized tenderness. There is no rebound and no guarding.  Musculoskeletal: Normal range of motion.  Neurological: Alert, no facial droop, fluent speech, moves all extremities symmetrically Skin: Skin is warm and dry.  Psychiatric: Cooperative   ED Treatments / Results  Labs (all labs ordered are listed, but only abnormal results are displayed) Labs Reviewed  CBC WITH DIFFERENTIAL/PLATELET - Abnormal; Notable for the following:       Result Value   WBC 12.8 (*)    Neutro Abs 9.9 (*)    All other components within normal limits  COMPREHENSIVE METABOLIC PANEL - Abnormal; Notable for the following:    CO2 21 (*)    Glucose, Bld 105 (*)    ALT 16 (*)    All other components within normal limits  URINALYSIS, ROUTINE W REFLEX MICROSCOPIC - Abnormal; Notable for the following:    Ketones, ur 5 (*)    All other components within normal limits  ETHANOL  LIPASE, BLOOD  I-STAT TROPOININ, ED  POC OCCULT BLOOD, ED  I-STAT TROPOININ, ED    EKG  EKG Interpretation  Date/Time:  Sunday April 22 2016 21:09:11 EDT Ventricular Rate:  71 PR Interval:    QRS Duration: 122 QT Interval:  437 QTC Calculation: 475 R Axis:   -77 Text Interpretation:  Ectopic atrial rhythm RBBB and LAFB similar to EKG 09/16/11 aside from ectopic p-waves Confirmed by Paxson Harrower MD, Atharva Mirsky ([id number]) on 04/22/2016 10:46:35 PM       Radiology Dg Chest 2 View  Result Date: 04/22/2016 CLINICAL DATA:  Nausea and vomiting.  Chest pain. EXAM: CHEST  2 VIEW COMPARISON:  September 16, 2011 FINDINGS: The heart size and mediastinal contours are within normal limits. Both lungs are clear. The visualized skeletal structures are unremarkable. IMPRESSION: No active cardiopulmonary disease. Electronically Signed   By: David  Williams III M.D   On: 04/22/2016 19:51    Procedures Procedures (including critical care time)  Medications Ordered in ED Medications  ondansetron (ZOFRAN) injection 4 mg (4 mg  Intravenous Given 04/22/16 1839)  sodium chloride 0.9 % bolus 1,000 mL (0 mLs Intravenous Stopped 04/22/16 2018)  chlordiazePOXIDE (LIBRIUM) capsule 50 mg (50 mg Oral Given 04/22/16 1839)  metoCLOPramide (REGLAN) injection 10 mg (10 mg Intravenous Given 04/22/16 2019)  diazepam (VALIUM) injection 10 mg (10 mg Intravenous Given 04/22/16 2133)     Initial Impression / Assessment and Plan / ED Course  I have reviewed the triage vital signs and the nursing notes.  Pertinent labs & imaging results that were available during my care of the patient were reviewed by me and considered in my medical decision making (see chart for details).     45  year old male who presents with concern for opiate and alcohol Withdrawal. He is nontoxic and in no acute distress. His vital signs are within normal limits. He complains of diffuse pain all over, and repeatedly requests for pain medication. Discussed that we will not be giving him any pain medication today but treating him  symptomatically. Abdomen benign and not suggestive of acute intraabdominal processes. Serial EKG and troponin normal. Low risk for ACS. Did not feel chest pain suggestive of ACS. PERC negative and no concerns for PE. Unlikely to be dissection. He did receive Valium and Librium for alcohol withdrawal, with improvement in symptoms. Does not require admission for detox given improved symptoms. Tolerating PO. Blood work reassuring. Discharged with detox resources.  The patient appears reasonably screened and/or stabilized for discharge and I doubt any other medical condition or other [facility] requiring further screening, evaluation, or treatment in the ED at this time prior to discharge.  Strict return and follow-up instructions reviewed. He expressed understanding of all discharge instructions and felt comfortable with the plan of care.   Final Clinical Impressions(s) / ED Diagnoses   Final diagnoses:  Opiate withdrawal (HCC)  Polysubstance abuse    Alcohol withdrawal syndrome without complication (HCC)    New Prescriptions New Prescriptions   CHLORDIAZEPOXIDE (LIBRIUM) 25 MG CAPSULE    50mg  PO TID x 1D, then 25-50mg  PO BID X 1D, then 25-50mg  PO QD X 1D   LOPERAMIDE (IMODIUM) 2 MG CAPSULE    Take 1 capsule (2 mg total) by mouth 4 (four) times daily as needed for diarrhea or loose stools.   ONDANSETRON (ZOFRAN) 4 MG TABLET    Take 1 tablet (4 mg total) by mouth every 6 (six) hours.     Lavera Guiseana Duo Kealii Thueson, MD 04/22/16 2350

## 2016-04-22 NOTE — ED Notes (Signed)
Pt requesting pain medication.  

## 2016-04-22 NOTE — Discharge Instructions (Signed)
Please take these medications for your alcohol and opiate withdrawal.  You are given resources for detox facilities.  Please return for worsening symptoms, including intractable vomiting, confusion, or any other symptoms concerning to you.

## 2016-04-22 NOTE — ED Notes (Addendum)
Pt continues to unhook himself from all monitors

## 2017-03-22 ENCOUNTER — Other Ambulatory Visit: Payer: Self-pay

## 2017-03-22 ENCOUNTER — Encounter (HOSPITAL_COMMUNITY): Payer: Self-pay | Admitting: Family Medicine

## 2017-03-22 ENCOUNTER — Ambulatory Visit (HOSPITAL_COMMUNITY)
Admission: EM | Admit: 2017-03-22 | Discharge: 2017-03-22 | Disposition: A | Discharge status: Nursing Home (Includes ICF) | Payer: Medicaid Other | Attending: Family Medicine | Admitting: Family Medicine

## 2017-03-22 ENCOUNTER — Inpatient Hospital Stay (HOSPITAL_COMMUNITY)
Admission: EM | Admit: 2017-03-22 | Discharge: 2017-03-26 | DRG: 313 | Disposition: A | Discharge status: Home / Self Care | Payer: Medicaid Other | Attending: Internal Medicine | Admitting: Internal Medicine

## 2017-03-22 ENCOUNTER — Encounter (HOSPITAL_COMMUNITY): Payer: Self-pay | Admitting: *Deleted

## 2017-03-22 ENCOUNTER — Emergency Department (HOSPITAL_COMMUNITY): Payer: Medicaid Other

## 2017-03-22 DIAGNOSIS — Z765 Malingerer [conscious simulation]: Secondary | ICD-10-CM

## 2017-03-22 DIAGNOSIS — Z811 Family history of alcohol abuse and dependence: Secondary | ICD-10-CM

## 2017-03-22 DIAGNOSIS — Z720 Tobacco use: Secondary | ICD-10-CM

## 2017-03-22 DIAGNOSIS — F1410 Cocaine abuse, uncomplicated: Secondary | ICD-10-CM | POA: Diagnosis present

## 2017-03-22 DIAGNOSIS — Q211 Atrial septal defect: Secondary | ICD-10-CM

## 2017-03-22 DIAGNOSIS — Z9119 Patient's noncompliance with other medical treatment and regimen: Secondary | ICD-10-CM

## 2017-03-22 DIAGNOSIS — F1910 Other psychoactive substance abuse, uncomplicated: Secondary | ICD-10-CM

## 2017-03-22 DIAGNOSIS — F10230 Alcohol dependence with withdrawal, uncomplicated: Secondary | ICD-10-CM

## 2017-03-22 DIAGNOSIS — F1110 Opioid abuse, uncomplicated: Secondary | ICD-10-CM | POA: Diagnosis present

## 2017-03-22 DIAGNOSIS — F17210 Nicotine dependence, cigarettes, uncomplicated: Secondary | ICD-10-CM | POA: Diagnosis present

## 2017-03-22 DIAGNOSIS — R0789 Other chest pain: Principal | ICD-10-CM | POA: Diagnosis present

## 2017-03-22 DIAGNOSIS — G8929 Other chronic pain: Secondary | ICD-10-CM | POA: Diagnosis present

## 2017-03-22 DIAGNOSIS — Z87898 Personal history of other specified conditions: Secondary | ICD-10-CM | POA: Diagnosis not present

## 2017-03-22 DIAGNOSIS — F1911 Other psychoactive substance abuse, in remission: Secondary | ICD-10-CM

## 2017-03-22 DIAGNOSIS — R079 Chest pain, unspecified: Secondary | ICD-10-CM

## 2017-03-22 DIAGNOSIS — R1110 Vomiting, unspecified: Secondary | ICD-10-CM | POA: Diagnosis not present

## 2017-03-22 DIAGNOSIS — I4519 Other right bundle-branch block: Secondary | ICD-10-CM | POA: Diagnosis present

## 2017-03-22 DIAGNOSIS — N5089 Other specified disorders of the male genital organs: Secondary | ICD-10-CM | POA: Diagnosis present

## 2017-03-22 DIAGNOSIS — N5082 Scrotal pain: Secondary | ICD-10-CM | POA: Diagnosis present

## 2017-03-22 DIAGNOSIS — M549 Dorsalgia, unspecified: Secondary | ICD-10-CM | POA: Diagnosis present

## 2017-03-22 DIAGNOSIS — D649 Anemia, unspecified: Secondary | ICD-10-CM | POA: Diagnosis present

## 2017-03-22 DIAGNOSIS — I253 Aneurysm of heart: Secondary | ICD-10-CM | POA: Diagnosis present

## 2017-03-22 LAB — LIPID PANEL
CHOL/HDL RATIO: 3.7 ratio
Cholesterol: 133 mg/dL (ref 0–200)
HDL: 36 mg/dL — ABNORMAL LOW (ref >40)
LDL CALC: 77 mg/dL (ref 0–99)
Triglycerides: 101 mg/dL (ref <150)
VLDL: 20 mg/dL (ref 0–40)

## 2017-03-22 LAB — CREATININE, SERUM
Creatinine, Ser: 0.79 mg/dL (ref 0.61–1.24)
GFR calc Af Amer: >60 mL/min (ref >60)
GFR calc non Af Amer: >60 mL/min (ref >60)

## 2017-03-22 LAB — I-STAT TROPONIN, ED: Troponin i, poc: 0 ng/mL (ref 0.00–0.08)

## 2017-03-22 LAB — CBC
HCT: 35.6 % — ABNORMAL LOW (ref 39.0–52.0)
HEMATOCRIT: 38.8 % — AB (ref 39.0–52.0)
HEMOGLOBIN: 13 g/dL (ref 13.0–17.0)
Hemoglobin: 11.9 g/dL — ABNORMAL LOW (ref 13.0–17.0)
MCH: 29.5 pg (ref 26.0–34.0)
MCH: 29.5 pg (ref 26.0–34.0)
MCHC: 33.5 g/dL (ref 30.0–36.0)
MCHC: 33.4 g/dL (ref 30.0–36.0)
MCV: 88.2 fL (ref 78.0–100.0)
MCV: 88.1 fL (ref 78.0–100.0)
PLATELETS: 214 10*3/uL (ref 150–400)
Platelets: 234 10*3/uL (ref 150–400)
RBC: 4.4 MIL/uL (ref 4.22–5.81)
RBC: 4.04 MIL/uL — ABNORMAL LOW (ref 4.22–5.81)
RDW: 14.5 % (ref 11.5–15.5)
RDW: 14.7 % (ref 11.5–15.5)
WBC: 10.3 10*3/uL (ref 4.0–10.5)
WBC: 8.9 10*3/uL (ref 4.0–10.5)

## 2017-03-22 LAB — BASIC METABOLIC PANEL
ANION GAP: 10 (ref 5–15)
BUN: 20 mg/dL (ref 6–20)
CHLORIDE: 104 mmol/L (ref 101–111)
CO2: 23 mmol/L (ref 22–32)
Calcium: 9.1 mg/dL (ref 8.9–10.3)
Creatinine, Ser: 0.88 mg/dL (ref 0.61–1.24)
GFR calc non Af Amer: >60 mL/min (ref >60)
Glucose, Bld: 97 mg/dL (ref 65–99)
POTASSIUM: 4.4 mmol/L (ref 3.5–5.1)
Sodium: 137 mmol/L (ref 135–145)

## 2017-03-22 LAB — RAPID URINE DRUG SCREEN, HOSP PERFORMED
Amphetamines: NOT DETECTED
BENZODIAZEPINES: NOT DETECTED
Barbiturates: POSITIVE — AB
COCAINE: NOT DETECTED
Opiates: POSITIVE — AB
Tetrahydrocannabinol: NOT DETECTED

## 2017-03-22 LAB — I-STAT BETA HCG BLOOD, ED (MC, WL, AP ONLY)

## 2017-03-22 LAB — MRSA PCR SCREENING: MRSA BY PCR: NEGATIVE

## 2017-03-22 LAB — D-DIMER, QUANTITATIVE (NOT AT ARMC): D-Dimer, Quant: <0.27 ug/mL-FEU (ref 0.00–0.50)

## 2017-03-22 LAB — TROPONIN I: Troponin I: <0.03 ng/mL (ref <0.03)

## 2017-03-22 MED ORDER — ASPIRIN 81 MG PO CHEW
CHEWABLE_TABLET | ORAL | Status: AC
  Filled 2017-03-22: qty 4

## 2017-03-22 MED ORDER — ONDANSETRON HCL 4 MG/2ML IJ SOLN
4.0000 mg | Freq: Once | INTRAMUSCULAR | Status: AC
  Administered 2017-03-22: 4 mg via INTRAVENOUS
  Filled 2017-03-22: qty 2

## 2017-03-22 MED ORDER — ACETAMINOPHEN 325 MG PO TABS
650.0000 mg | ORAL_TABLET | Freq: Once | ORAL | Status: AC
  Administered 2017-03-22: 650 mg via ORAL
  Filled 2017-03-22: qty 2

## 2017-03-22 MED ORDER — HYDROMORPHONE HCL 1 MG/ML IJ SOLN
1.0000 mg | Freq: Once | INTRAMUSCULAR | Status: AC
  Administered 2017-03-22: 1 mg via INTRAVENOUS
  Filled 2017-03-22: qty 1

## 2017-03-22 MED ORDER — ASPIRIN 81 MG PO CHEW
324.0000 mg | CHEWABLE_TABLET | Freq: Once | ORAL | Status: AC
  Administered 2017-03-22: 324 mg via ORAL

## 2017-03-22 MED ORDER — ASPIRIN 81 MG PO CHEW
CHEWABLE_TABLET | ORAL | Status: AC
  Filled 2017-03-22: qty 1

## 2017-03-22 MED ORDER — MORPHINE SULFATE (PF) 4 MG/ML IV SOLN
4.0000 mg | Freq: Once | INTRAVENOUS | Status: AC
  Administered 2017-03-22: 4 mg via INTRAVENOUS
  Filled 2017-03-22: qty 1

## 2017-03-22 MED ORDER — GI COCKTAIL ~~LOC~~
30.0000 mL | Freq: Once | ORAL | Status: AC
  Administered 2017-03-22: 30 mL via ORAL

## 2017-03-22 MED ORDER — NICOTINE 21 MG/24HR TD PT24
21.0000 mg | MEDICATED_PATCH | Freq: Every day | TRANSDERMAL | Status: DC
  Administered 2017-03-22 – 2017-03-26 (×4): 21 mg via TRANSDERMAL
  Filled 2017-03-22 (×4): qty 1

## 2017-03-22 MED ORDER — ONDANSETRON HCL 4 MG/2ML IJ SOLN
4.0000 mg | Freq: Four times a day (QID) | INTRAMUSCULAR | Status: DC | PRN
  Administered 2017-03-23 – 2017-03-25 (×5): 4 mg via INTRAVENOUS
  Filled 2017-03-22 (×5): qty 2

## 2017-03-22 MED ORDER — GI COCKTAIL ~~LOC~~
ORAL | Status: AC
  Filled 2017-03-22: qty 30

## 2017-03-22 MED ORDER — ENOXAPARIN SODIUM 40 MG/0.4ML ~~LOC~~ SOLN
40.0000 mg | SUBCUTANEOUS | Status: DC
  Administered 2017-03-22 – 2017-03-25 (×4): 40 mg via SUBCUTANEOUS
  Filled 2017-03-22 (×4): qty 0.4

## 2017-03-22 MED ORDER — HYDROMORPHONE HCL 1 MG/ML IJ SOLN
1.0000 mg | INTRAMUSCULAR | Status: DC | PRN
  Administered 2017-03-22 – 2017-03-23 (×4): 1 mg via INTRAVENOUS
  Filled 2017-03-22 (×4): qty 1

## 2017-03-22 MED ORDER — NITROGLYCERIN 2 % TD OINT
0.5000 [in_us] | TOPICAL_OINTMENT | Freq: Four times a day (QID) | TRANSDERMAL | Status: DC
  Administered 2017-03-22 – 2017-03-23 (×4): 0.5 [in_us] via TOPICAL
  Filled 2017-03-22: qty 1
  Filled 2017-03-22: qty 30

## 2017-03-22 MED ORDER — NICOTINE 21 MG/24HR TD PT24: 21.0000 mg | MEDICATED_PATCH | Freq: Once | TRANSDERMAL | Status: DC

## 2017-03-22 MED ORDER — ACETAMINOPHEN 325 MG PO TABS
650.0000 mg | ORAL_TABLET | ORAL | Status: DC | PRN
  Administered 2017-03-23 – 2017-03-26 (×3): 650 mg via ORAL
  Filled 2017-03-22 (×3): qty 2

## 2017-03-22 NOTE — Discharge Instructions (Signed)
Go directly to the emergency room for further evaluation °

## 2017-03-22 NOTE — ED Triage Notes (Signed)
Pt reports two days of vomiting.  He woke up this morning at 0500 with centralized CP that does not radiate anywhere else.  He describes the pain as sharp and 10/10.  He went to work and smoked a few cigarettes, but the pain became unbearable so his boss brought him in.  He denies any cardiac history but does admit to a two pack a day cigarette habit.  Pt appears to be in a lot of pain, and especially with movement, but I am unable to reproduce the pain with palpation.

## 2017-03-22 NOTE — ED Notes (Signed)
ED Provider at bedside. 

## 2017-03-22 NOTE — ED Notes (Signed)
Called lab to verify that a blue top was drawn to run D-Dimer. Main lab reports they will process now.

## 2017-03-22 NOTE — ED Provider Notes (Signed)
[facility] EMERGENCY DEPARTMENT Provider Note   CSN: [id number] Arrival date & time: 03/22/17  1139     History   Chief Complaint Chief Complaint  Patient presents with  . Chest Pain    HPI Nicholas [name] is a 47 y.o. male with chronic back pain, alcohol abuse, opioid abuse, and 60-pack-year smoking history who presents with sudden onset, sharp chest pain that began around 5:30 AM today.  Patient states the pain is been constant since onset.  He denies any significant shortness of breath.  He has had some associated nausea.  The pain does not radiate.  His pain is not worse with movement.  He denies any new back pain other than his chronic low back pain.  He denies any recent heavy lifting.  He [place] the beach and back in 1 day less than a week ago, having more than 8 hours in the car.  He reports his legs have felt sore and weak, but no pain or swelling in one specific leg.  He denies any abdominal pain, vomiting.  He is not taking any medication at home for his symptoms.  Aspirin given at urgent care prior to arrival.  HPI  Past Medical History:  Diagnosis Date  . Chronic back pain   . ETOH abuse   . Opiate addiction [facility])     Patient Active Problem List   Diagnosis Date Noted  . Chest pain 03/22/2017  . History of substance abuse 03/22/2017  . Tobacco abuse 03/22/2017  . Nondisplaced fracture of lateral malleolus of left fibula, initial encounter for closed fracture 02/23/2016  . Abdominal pain 11/08/2015  . Dilated bile duct 11/08/2015  . Nausea & vomiting 11/08/2015    History reviewed. No pertinent surgical history.     Home Medications    Prior to Admission medications   Not on File    Family History Family History  Problem Relation Age of Onset  . Alcoholism Father   . Testicular cancer Father   . Heart Problems Father   . Hernia Father   . Intracerebral hemorrhage Maternal Grandmother     Social History Social  History   Tobacco Use  . Smoking status: Current Every Day Smoker    Packs/day: 2.00    Types: Cigarettes  . Smokeless tobacco: Never Used  Substance Use Topics  . Alcohol use: Yes  . Drug use: Yes     Allergies   Patient has no known allergies.   Review of Systems Review of Systems  Constitutional: Negative for chills and fever.  HENT: Negative for facial swelling and sore throat.   Respiratory: Negative for shortness of breath.   Cardiovascular: Positive for chest pain.  Gastrointestinal: Positive for nausea. Negative for abdominal pain and vomiting.  Genitourinary: Negative for dysuria.  Musculoskeletal: Negative for back pain.  Skin: Negative for rash and wound.  Neurological: Negative for headaches.  Psychiatric/Behavioral: The patient is not nervous/anxious.      Physical Exam Updated Vital Signs BP 112/72   Pulse 68   Temp 98.1 F (36.7 C) (Oral)   Resp 18   SpO2 100%   Physical Exam  Constitutional: He appears well-developed and well-nourished. No distress.  HENT:  Head: Normocephalic and atraumatic.  Mouth/Throat: Oropharynx is clear and moist. No oropharyngeal exudate.  Eyes: Conjunctivae are normal. Pupils are equal, round, and reactive to light. Right eye exhibits no discharge. Left eye exhibits no discharge. No scleral icterus.  Neck: Normal range of  motion. Neck supple. No thyromegaly present.  Cardiovascular: Normal rate, regular rhythm, normal heart sounds and intact distal pulses. Exam reveals no gallop and no friction rub.  No murmur heard. Pulmonary/Chest: Effort normal and breath sounds normal. No stridor. No respiratory distress. He has no wheezes. He has no rales. He exhibits no tenderness.  Abdominal: Soft. Bowel sounds are normal. He exhibits no distension. There is no tenderness. There is no rebound and no guarding.  Musculoskeletal: He exhibits no edema.  Lymphadenopathy:    He has no cervical adenopathy.  Neurological: He is alert.  Coordination normal.  Skin: Skin is warm and dry. No rash noted. He is not diaphoretic. No pallor.  Psychiatric: He has a normal mood and affect.  Nursing note and vitals reviewed.    ED Treatments / Results  Labs (all labs ordered are listed, but only abnormal results are displayed) Labs Reviewed  CBC - Abnormal; Notable for the following components:      Result Value   HCT 38.8 (*)    All other components within normal limits  BASIC METABOLIC PANEL  D-DIMER, QUANTITATIVE (NOT AT [facility])  RAPID URINE DRUG SCREEN, HOSP PERFORMED  TROPONIN I  TROPONIN I  TROPONIN I  I-STAT TROPONIN, ED  I-STAT BETA HCG BLOOD, ED ([facility], [facility], [facility] ONLY)    EKG  EKG Interpretation  Date/Time:  Friday March 22 2017 11:39:43 EST Ventricular Rate:  81 PR Interval:  142 QRS Duration: 108 QT Interval:  382 QTC Calculation: 443 R Axis:   73 Text Interpretation:  Normal sinus rhythm Incomplete right bundle branch block Borderline ECG unchanged from prior 2/19 Confirmed by Meridee Score [id number]) on 03/22/2017 3:33:16 PM       Radiology Dg Chest 2 View  Result Date: 03/22/2017 CLINICAL DATA:  Chest pain beginning today.  Smoking history. EXAM: CHEST  2 VIEW COMPARISON:  04/22/2016 FINDINGS: Heart size is normal. Mediastinal shadows are normal. The lungs are clear. No bronchial thickening. No infiltrate, mass, effusion or collapse. Pulmonary vascularity is normal. No bony abnormality. IMPRESSION: Normal chest Electronically Signed   By: Paulina Fusi M.D.   On: 03/22/2017 12:03    Procedures Procedures (including critical care time)  Medications Ordered in ED Medications  nitroGLYCERIN (NITROGLYN) 2 % ointment 0.5 inch (0.5 inches Topical Given 03/22/17 1710)  ondansetron (ZOFRAN) injection 4 mg (not administered)  acetaminophen (TYLENOL) tablet 650 mg (not administered)  nicotine (NICODERM CQ - dosed in mg/24 hours) patch 21 mg (not administered)  morphine 4 MG/ML injection 4 mg (4 mg Intravenous  Given 03/22/17 1534)  HYDROmorphone (DILAUDID) injection 1 mg (1 mg Intravenous Given 03/22/17 1639)  HYDROmorphone (DILAUDID) injection 1 mg (1 mg Intravenous Given 03/22/17 1824)     Initial Impression / Assessment and Plan / ED Course  I have reviewed the triage vital signs and the nursing notes.  Pertinent labs & imaging results that were available during my care of the patient were reviewed by me and considered in my medical decision making (see chart for details).     Patient with atypical chest pain difficult to control in the ED.  Patient given morphine, 2 doses of Dilaudid, and nitroglycerin paste without complete relief.  Labs are unremarkable including d-dimer and initial troponin.  Chest x-ray is negative.  However HEART score is 4.  EKG shows NSR, incomplete right bundle branch block, and possible LVH.  Patient has no PCP and has not been to the doctor in years.  He smokes 2 packs  a day.  I feel patient is too high risk to sent home without rule out.  I spoke with Dr. Ashok PallWouk who will admit the patient for cardiac rule out.  Patient also evaluated by Dr. Charm BargesButler who guided the patient's management and agrees with plan.  Final Clinical Impressions(s) / ED Diagnoses   Final diagnoses:  Chest pain, unspecified type    ED Discharge Orders    None       Verdis PrimeLaw, Brizeida Mcmurry M, PA-C 03/22/17 2008    Terrilee FilesButler, Michael C, MD 03/23/17 1047

## 2017-03-22 NOTE — H&P (Addendum)
History and Physical    Nicholas JumperBobby D Nielsen ZOX:[id number] DOB: 03/19/1970 DOA: 03/22/2017  PCP: Patient, No Pcp Per  Patient coming from: home   Chief Complaint: chest pain  HPI: Nicholas [name] is a 47 y.o. male with medical history significant for smoking and polysubstance abuse (says one year since last used drugs, mainly opioids and cocaine), heavy smoker, no regular medical care, says has felt a little weak for the past few days but otherwise well, this morning awoke with significant substernal chest pain. Does not radiate. Not associated w/ exertion. No dyspnea or cough. No recent trauma or strenuous activity. No recent immobilization, no leg pain or swelling. NOthing seems to make better or worse other than the opioids he received in ED. Presented to outside urgent care who gave aspirin and transferred to our ED. Denies recent stressors.   ED Course: opioids, nitroglycerine, labs, ekg, cxr  Review of Systems: As per HPI otherwise 10 point review of systems negative.    Past Medical History:  Diagnosis Date  . Chronic back pain   . ETOH abuse   . Opiate addiction (HCC)     History reviewed. No pertinent surgical history.   reports that he has been smoking cigarettes.  He has been smoking about 2.00 packs per day. he has never used smokeless tobacco. He reports that he drinks alcohol. He reports that he uses drugs.  No Known Allergies  Family History  Problem Relation Age of Onset  . Alcoholism Father   . Testicular cancer Father   . Heart Problems Father   . Hernia Father   . Intracerebral hemorrhage Maternal Grandmother     Prior to Admission medications   Not on File    Physical Exam: Vitals:   03/22/17 1140 03/22/17 1412 03/22/17 1543  BP: 128/77 117/66 112/80  Pulse: 76 (!) 56 65  Resp: 19 19 18   Temp: 98.1 F (36.7 C)    TempSrc: Oral    SpO2: 100% 100% 100%    Constitutional: In moderate distress Head: Atraumatic Eyes: Conjunctiva clear ENM: Moist  mucous membranes. poordentition.  Neck: Supple Respiratory: Clear to auscultation bilaterally, no wheezing/rales/rhonchi. Normal respiratory effort. No accessory muscle use. . Cardiovascular: Regular rate and rhythm. No murmurs/rubs/gallops. Abdomen: Non-tender, non-distended. No masses. No rebound or guarding. Positive bowel sounds. Musculoskeletal: No joint deformity upper and lower extremities. Normal ROM, no contractures. Normal muscle tone.  Skin: No rashes, lesions, or ulcers.  Extremities: No peripheral edema. Palpable peripheral pulses. Neurologic: Alert, moving all 4 extremities. Psychiatric: Normal insight and judgement.   Labs on Admission: I have personally reviewed following labs and imaging studies  CBC: Recent Labs  Lab 03/22/17 1146  WBC 10.3  HGB 13.0  HCT 38.8*  MCV 88.2  PLT 234   Basic Metabolic Panel: Recent Labs  Lab 03/22/17 1146  NA 137  K 4.4  CL 104  CO2 23  GLUCOSE 97  BUN 20  CREATININE 0.88  CALCIUM 9.1   GFR: CrCl cannot be calculated (Unknown ideal weight.). Liver Function Tests: No results for input(s): AST, ALT, ALKPHOS, BILITOT, PROT, ALBUMIN in the last 168 hours. No results for input(s): LIPASE, AMYLASE in the last 168 hours. No results for input(s): AMMONIA in the last 168 hours. Coagulation Profile: No results for input(s): INR, PROTIME in the last 168 hours. Cardiac Enzymes: No results for input(s): CKTOTAL, CKMB, CKMBINDEX, TROPONINI in the last 168 hours. BNP (last 3 results) No results for input(s): PROBNP in the last 8760  hours. HbA1C: No results for input(s): HGBA1C in the last 72 hours. CBG: No results for input(s): GLUCAP in the last 168 hours. Lipid Profile: No results for input(s): CHOL, HDL, LDLCALC, TRIG, CHOLHDL, LDLDIRECT in the last 72 hours. Thyroid Function Tests: No results for input(s): TSH, T4TOTAL, FREET4, T3FREE, THYROIDAB in the last 72 hours. Anemia Panel: No results for input(s): VITAMINB12,  FOLATE, FERRITIN, TIBC, IRON, RETICCTPCT in the last 72 hours. Urine analysis:    Component Value Date/Time   COLORURINE YELLOW 04/22/2016 2003   APPEARANCEUR CLEAR 04/22/2016 2003   LABSPEC 1.020 04/22/2016 2003   PHURINE 7.0 04/22/2016 2003   GLUCOSEU NEGATIVE 04/22/2016 2003   HGBUR NEGATIVE 04/22/2016 2003   BILIRUBINUR NEGATIVE 04/22/2016 2003   KETONESUR 5 (A) 04/22/2016 2003   PROTEINUR NEGATIVE 04/22/2016 2003   UROBILINOGEN 0.2 02/01/2012 1041   NITRITE NEGATIVE 04/22/2016 2003   LEUKOCYTESUR NEGATIVE 04/22/2016 2003    Radiological Exams on Admission: Dg Chest 2 View  Result Date: 03/22/2017 CLINICAL DATA:  Chest pain beginning today.  Smoking history. EXAM: CHEST  2 VIEW COMPARISON:  04/22/2016 FINDINGS: Heart size is normal. Mediastinal shadows are normal. The lungs are clear. No bronchial thickening. No infiltrate, mass, effusion or collapse. Pulmonary vascularity is normal. No bony abnormality. IMPRESSION: Normal chest Electronically Signed   By: Paulina Fusi M.D.   On: 03/22/2017 12:03    EKG: Independently reviewed. No ischemic changes  Assessment/Plan Active Problems:   Chest pain   History of substance abuse   Tobacco abuse   # Chest pain - heart score 3. Risk factors include age, sex, and smoking history. Initial troponin negative and ekg not ischemic, but moderate chest pain persists after opioids and nitroglycerine. cxr unremarkable, no signs dvt, and d-dimer negative. Given moderate risk for ACS and ongoing pain admitting to SDU. - cycle enzymes - telemetry - repeat ekg in am - continue dilaudid for pain - a1c and lipids for rist stratification - cardiology consult placed in Epic and sent to trish [facility]  # tobacco abuse - nicotine patch  # history substance abuse - denies recent drug use. No primary care. - uds - hep c, hiv, hep b, rpr  DVT prophylaxis: lovenox, scds Code Status: full  Family Communication: mother shirley cook [phone]    Disposition Plan: tbd  Consults called: cardiology  Admission status: step-down   Silvano Bilis MD [facility] Pager [phone]  If 7PM-7AM, please contact night-coverage [email or web address] Password [facility]  03/22/2017, 7:07 PM

## 2017-03-22 NOTE — ED Triage Notes (Signed)
Pt reports waking up this am 0500 with sharp mid chest pains. Denies sob. States its a steady pain but seems to increase with movement.

## 2017-03-22 NOTE — ED Provider Notes (Signed)
[facility]   [id number] 03/22/17 Arrival Time: 1100   SUBJECTIVE:  Nicholas [name] is a 47 y.o. male who presents to the urgent care with complaint of shooting sternal chest pain that began at 5:00 this morning. He was in his usual state of health until yesterday afternoon when he vomited a couple times. He did have some heartburn as well yesterday.  Pain began acutely at about time this morning which she described as sharp and constant, non-positional, not place any activity. He has no localized chest pain. He's not had this before. He denies sweats or nausea at the present time. He also denies shortness of breath.  Patient is a smoker. 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  Patient denies any recent use of controlled substances.  Past Medical History:  Diagnosis Date  . Chronic back pain   . ETOH abuse   . Opiate addiction (HCC)    Family History  Problem Relation Age of Onset  . Alcoholism Father   . Testicular cancer Father   . Heart Problems Father   . Hernia Father   . Intracerebral hemorrhage Maternal Grandmother    Social History   Socioeconomic History  . Marital status: Single    Spouse name: Not on file  . Number of children: Not on file  . Years of education: Not on file  . Highest education level: Not on file  Social Needs  . Financial resource strain: Not on file  . Food insecurity - worry: Not on file  . Food insecurity - inability: Not on file  . Transportation needs - medical: Not on file  . Transportation needs - non-medical: Not on file  Occupational History  . Not on file  Tobacco Use  . Smoking status: Current Every Day Smoker    Packs/day: 2.00    Types: Cigarettes  . Smokeless tobacco: Never Used  Substance and Sexual Activity  . Alcohol use: Yes  . Drug use: Yes  . Sexual activity: Not on file  Other Topics Concern  . Not on file  Social History Narrative  . Not on  file   No outpatient medications have been marked as taking for the 03/22/17 encounter [facility](Hospital Encounter).   No Known Allergies    ROS: As per HPI, remainder of ROS negative.   OBJECTIVE:   Vitals:   03/22/17 1115  BP: 124/65  Pulse: 84  Temp: 98.4 F (36.9 C)  TempSrc: Oral  SpO2: 100%     General appearance: alert; acute distress Eyes: PERRL; EOMI; conjunctiva normal HENT: normocephalic; atraumatic; TMs normal, canal normal, external ears normal without trauma; nasal mucosa normal; oral mucosa normal Neck: supple Lungs: clear to auscultation bilaterally Heart: regular rate and rhythm Abdomen: soft, non-tender; bowel sounds normal; no masses or organomegaly; no guarding or rebound tenderness Back: no CVA tenderness Extremities: no cyanosis or edema; missing distal phalanges of 3 fingers on left hand. Skin: warm and dry Neurologic: normal gait; grossly normal Psychological: alert and cooperative; normal mood and affect   EKG:  No acute changes,  Incomplete RBBB   Labs:  Results for orders placed or performed during the hospital encounter of 04/22/16  CBC with Differential  Result Value Ref Range   WBC 12.8 (H) 4.0 - 10.5 K/uL   RBC 5.04 4.22 - 5.81 MIL/uL   Hemoglobin 14.8 13.0 - 17.0 g/dL   HCT 40.944.4 81.139.0 - 91.452.0 %  MCV 88.1 78.0 - 100.0 fL   MCH 29.4 26.0 - 34.0 pg   MCHC 33.3 30.0 - 36.0 g/dL   RDW 16.1 09.6 - 04.5 %   Platelets 264 150 - 400 K/uL   Neutrophils Relative % 77 %   Neutro Abs 9.9 (H) 1.7 - 7.7 K/uL   Lymphocytes Relative 18 %   Lymphs Abs 2.3 0.7 - 4.0 K/uL   Monocytes Relative 4 %   Monocytes Absolute 0.5 0.1 - 1.0 K/uL   Eosinophils Relative 0 %   Eosinophils Absolute 0.0 0.0 - 0.7 K/uL   Basophils Relative 1 %   Basophils Absolute 0.1 0.0 - 0.1 K/uL  Comprehensive metabolic panel  Result Value Ref Range   Sodium 136 135 - 145 mmol/L   Potassium 3.7 3.5 - 5.1 mmol/L   Chloride 103 101 - 111 mmol/L   CO2 21 (L) 22 - 32 mmol/L    Glucose, Bld 105 (H) 65 - 99 mg/dL   BUN 9 6 - 20 mg/dL   Creatinine, Ser 4.09 0.61 - 1.24 mg/dL   Calcium 9.3 8.9 - 81.1 mg/dL   Total Protein 7.2 6.5 - 8.1 g/dL   Albumin 3.8 3.5 - 5.0 g/dL   AST 20 15 - 41 U/L   ALT 16 (L) 17 - 63 U/L   Alkaline Phosphatase 67 38 - 126 U/L   Total Bilirubin 0.6 0.3 - 1.2 mg/dL   GFR calc non Af Amer >60 >60 mL/min   GFR calc Af Amer >60 >60 mL/min   Anion gap 12 5 - 15  Ethanol  Result Value Ref Range   Alcohol, Ethyl (B) <5 <5 mg/dL  Lipase, blood  Result Value Ref Range   Lipase 16 11 - 51 U/L  Urinalysis, Routine w reflex microscopic  Result Value Ref Range   Color, Urine YELLOW YELLOW   APPearance CLEAR CLEAR   Specific Gravity, Urine 1.020 1.005 - 1.030   pH 7.0 5.0 - 8.0   Glucose, UA NEGATIVE NEGATIVE mg/dL   Hgb urine dipstick NEGATIVE NEGATIVE   Bilirubin Urine NEGATIVE NEGATIVE   Ketones, ur 5 (A) NEGATIVE mg/dL   Protein, ur NEGATIVE NEGATIVE mg/dL   Nitrite NEGATIVE NEGATIVE   Leukocytes, UA NEGATIVE NEGATIVE  I-Stat Troponin, ED (not at [facility])  Result Value Ref Range   Troponin i, poc 0.00 0.00 - 0.08 ng/mL   Comment 3          POC occult blood, ED  Result Value Ref Range   Fecal Occult Bld NEGATIVE NEGATIVE  I-Stat Troponin, ED (not at [facility])  Result Value Ref Range   Troponin i, poc 0.00 0.00 - 0.08 ng/mL   Comment 3            Labs Reviewed - No data to display  No results found.     ASSESSMENT & PLAN:  1. Chest pain, unspecified type     Meds ordered this encounter  Medications  . gi cocktail (Maalox,Lidocaine,Donnatal)  . aspirin chewable tablet 324 mg    Reviewed expectations re: course of current medical issues. Questions answered. Outlined signs and symptoms indicating need for more acute intervention. Patient verbalized understanding. After Visit Summary given.       Elvina Sidle, MD 03/22/17 [phone]

## 2017-03-22 NOTE — ED Notes (Signed)
Bed: UC05 Expected date:  Expected time:  Means of arrival:  Comments: 

## 2017-03-23 ENCOUNTER — Other Ambulatory Visit: Payer: Self-pay

## 2017-03-23 ENCOUNTER — Observation Stay (HOSPITAL_BASED_OUTPATIENT_CLINIC_OR_DEPARTMENT_OTHER): Payer: Medicaid Other

## 2017-03-23 DIAGNOSIS — Z87898 Personal history of other specified conditions: Secondary | ICD-10-CM

## 2017-03-23 DIAGNOSIS — Z720 Tobacco use: Secondary | ICD-10-CM

## 2017-03-23 DIAGNOSIS — F1910 Other psychoactive substance abuse, uncomplicated: Secondary | ICD-10-CM

## 2017-03-23 DIAGNOSIS — R079 Chest pain, unspecified: Secondary | ICD-10-CM

## 2017-03-23 DIAGNOSIS — F10230 Alcohol dependence with withdrawal, uncomplicated: Secondary | ICD-10-CM | POA: Diagnosis not present

## 2017-03-23 DIAGNOSIS — R569 Unspecified convulsions: Secondary | ICD-10-CM | POA: Diagnosis not present

## 2017-03-23 DIAGNOSIS — F10239 Alcohol dependence with withdrawal, unspecified: Secondary | ICD-10-CM | POA: Diagnosis not present

## 2017-03-23 LAB — TROPONIN I: Troponin I: <0.03 ng/mL (ref <0.03)

## 2017-03-23 LAB — ECHOCARDIOGRAM COMPLETE
Height: 72 in
Weight: 2392 oz

## 2017-03-23 LAB — RPR: RPR: NONREACTIVE

## 2017-03-23 LAB — HIV ANTIBODY (ROUTINE TESTING W REFLEX): HIV SCREEN 4TH GENERATION: NONREACTIVE

## 2017-03-23 MED ORDER — VITAMIN B-1 100 MG PO TABS
100.0000 mg | ORAL_TABLET | Freq: Every day | ORAL | Status: DC
  Administered 2017-03-23 – 2017-03-26 (×4): 100 mg via ORAL
  Filled 2017-03-23 (×4): qty 1

## 2017-03-23 MED ORDER — LORAZEPAM 1 MG PO TABS
0.0000 mg | ORAL_TABLET | Freq: Two times a day (BID) | ORAL | Status: DC
Start: 2017-03-25 — End: 2017-03-26
  Administered 2017-03-25 – 2017-03-26 (×3): 2 mg via ORAL
  Filled 2017-03-23 (×4): qty 2

## 2017-03-23 MED ORDER — LORAZEPAM 1 MG PO TABS
2.0000 mg | ORAL_TABLET | Freq: Three times a day (TID) | ORAL | Status: DC
  Administered 2017-03-23 – 2017-03-25 (×4): 2 mg via ORAL
  Filled 2017-03-23 (×4): qty 2

## 2017-03-23 MED ORDER — ADULT MULTIVITAMIN W/MINERALS CH
1.0000 | ORAL_TABLET | Freq: Every day | ORAL | Status: DC
  Administered 2017-03-23 – 2017-03-26 (×4): 1 via ORAL
  Filled 2017-03-23 (×4): qty 1

## 2017-03-23 MED ORDER — FOLIC ACID 1 MG PO TABS
1.0000 mg | ORAL_TABLET | Freq: Every day | ORAL | Status: DC
  Administered 2017-03-23 – 2017-03-26 (×4): 1 mg via ORAL
  Filled 2017-03-23 (×4): qty 1

## 2017-03-23 MED ORDER — KETOROLAC TROMETHAMINE 30 MG/ML IJ SOLN
30.0000 mg | Freq: Once | INTRAMUSCULAR | Status: AC
  Administered 2017-03-23: 30 mg via INTRAVENOUS
  Filled 2017-03-23: qty 1

## 2017-03-23 MED ORDER — MORPHINE SULFATE (PF) 2 MG/ML IV SOLN
2.0000 mg | INTRAVENOUS | Status: DC | PRN
  Administered 2017-03-23 – 2017-03-24 (×7): 2 mg via INTRAVENOUS
  Filled 2017-03-23 (×7): qty 1

## 2017-03-23 MED ORDER — LORAZEPAM 1 MG PO TABS: 1.0000 mg | ORAL_TABLET | Freq: Four times a day (QID) | ORAL | Status: AC | PRN

## 2017-03-23 MED ORDER — LORAZEPAM 1 MG PO TABS
0.0000 mg | ORAL_TABLET | Freq: Four times a day (QID) | ORAL | Status: AC
  Administered 2017-03-23: 4 mg via ORAL
  Administered 2017-03-23 (×2): 1 mg via ORAL
  Administered 2017-03-24: 4 mg via ORAL
  Administered 2017-03-24 – 2017-03-25 (×2): 2 mg via ORAL
  Filled 2017-03-23 (×2): qty 1
  Filled 2017-03-23: qty 2
  Filled 2017-03-23 (×2): qty 4

## 2017-03-23 MED ORDER — MORPHINE SULFATE (PF) 4 MG/ML IV SOLN
4.0000 mg | INTRAVENOUS | Status: DC | PRN
  Administered 2017-03-23 (×2): 4 mg via INTRAVENOUS
  Filled 2017-03-23 (×2): qty 1

## 2017-03-23 MED ORDER — LORAZEPAM 2 MG/ML IJ SOLN
1.0000 mg | Freq: Four times a day (QID) | INTRAMUSCULAR | Status: AC | PRN
  Administered 2017-03-23 – 2017-03-26 (×5): 1 mg via INTRAVENOUS
  Filled 2017-03-23 (×5): qty 1

## 2017-03-23 MED ORDER — THIAMINE HCL 100 MG/ML IJ SOLN: 100.0000 mg | Freq: Every day | INTRAMUSCULAR | Status: DC

## 2017-03-23 NOTE — Progress Notes (Signed)
Called by telemetry, HR up to 130s. Assessed patient, c/o sharp chest pain. EKG done. BP 132/81. Morphine given. Continued CIWA protocol. Will continue to monitor.

## 2017-03-23 NOTE — Consult Note (Addendum)
Admit date: 03/22/2017 Referring Physician  Dr. Waymon Amato Primary Physician  None Primary Cardiologist  None Reason for Consultation  CP  HPI: Nicholas [name] is a 47 y.o. male who is being seen today for the evaluation of chest pain at the request of Dr. Waymon Amato.  This is a 47yo male with a history of polysubstance abuse with opioids, cocaine, ETOH and heavy tobacco use (apparently has not used cocaine or opiates in 1 year).  He was in his USOH until yesterday am when he awakened with SSCP with no radiation and no associated Nausea/diaphoresis or SOB.  It was nonexertional and nothing makes it better or worse.  He was given opiates in the ER that improved his pain.  He denies any recent long travel, immobilization or LE swelling.  He smokes 2ppd of cigarettes.  His father has a history of a heart problem but unsure what it is.   Trop negative x 3 and LDL 77.  EKG showed NSR with early repol and IRBBB.  Continues to complain of sharp stabbing CP only improved with pain meds.    PMH:   Past Medical History:  Diagnosis Date  . Chronic back pain   . ETOH abuse   . Opiate addiction (HCC)      PSH:  History reviewed. No pertinent surgical history.  Allergies:  Patient has no known allergies. Prior to Admit Meds:   No medications prior to admission.   Fam HX:    Family History  Problem Relation Age of Onset  . Alcoholism Father   . Testicular cancer Father   . Heart Problems Father   . Hernia Father   . Intracerebral hemorrhage Maternal Grandmother    Social HX:    Social History   Socioeconomic History  . Marital status: Single    Spouse name: Not on file  . Number of children: Not on file  . Years of education: Not on file  . Highest education level: Not on file  Social Needs  . Financial resource strain: Not on file  . Food insecurity - worry: Not on file  . Food insecurity - inability: Not on file  . Transportation needs - medical: Not on file  . Transportation needs  - non-medical: Not on file  Occupational History  . Not on file  Tobacco Use  . Smoking status: Current Every Day Smoker    Packs/day: 2.00    Types: Cigarettes  . Smokeless tobacco: Never Used  Substance and Sexual Activity  . Alcohol use: Yes  . Drug use: Yes  . Sexual activity: Not on file  Other Topics Concern  . Not on file  Social History Narrative  . Not on file     ROS:  All  ROS were addressed and are negative except what is stated in the HPI  Physical Exam: Blood pressure 128/72, pulse 79, temperature 97.6 F (36.4 C), temperature source Oral, resp. rate 14, height 6' (1.829 m), weight 149 lb 8 oz (67.8 kg), SpO2 100 %.    General: Well developed, well nourished, in no acute distress Head: Eyes PERRLA, No xanthomas.   Normal cephalic and atramatic  Lungs:   Clear bilaterally to auscultation and percussion. Heart:   HRRR S1 S2 Pulses are 2+ & equal.            No carotid bruit. No JVD.  No abdominal bruits. No femoral bruits. Abdomen: Bowel sounds are positive, abdomen soft and non-tender without masses or  Hernia's noted. Msk:  Back normal, normal gait. Normal strength and tone for age. Extremities:   No clubbing, cyanosis or edema.  DP +1 Neuro: Alert and oriented X 3. Psych:  Good affect, responds appropriately    Labs:   Lab Results  Component Value Date   WBC 8.9 03/22/2017   HGB 11.9 (L) 03/22/2017   HCT 35.6 (L) 03/22/2017   MCV 88.1 03/22/2017   PLT 214 03/22/2017    Recent Labs  Lab 03/22/17 1146 03/22/17 2208  NA 137  --   K 4.4  --   CL 104  --   CO2 23  --   BUN 20  --   CREATININE 0.88 0.79  CALCIUM 9.1  --   GLUCOSE 97  --    No results found for: PTT No results found for: INR, PROTIME Lab Results  Component Value Date   TROPONINI <0.03 03/23/2017     Lab Results  Component Value Date   CHOL 133 03/22/2017   Lab Results  Component Value Date   HDL 36 (L) 03/22/2017   Lab Results  Component Value Date     LDLCALC 77 03/22/2017   Lab Results  Component Value Date   TRIG 101 03/22/2017   Lab Results  Component Value Date   CHOLHDL 3.7 03/22/2017   No results found for: LDLDIRECT    Radiology:  Dg Chest 2 View  Result Date: 03/22/2017 CLINICAL DATA:  Chest pain beginning today.  Smoking history. EXAM: CHEST  2 VIEW COMPARISON:  04/22/2016 FINDINGS: Heart size is normal. Mediastinal shadows are normal. The lungs are clear. No bronchial thickening. No infiltrate, mass, effusion or collapse. Pulmonary vascularity is normal. No bony abnormality. IMPRESSION: Normal chest Electronically Signed   By: Paulina FusiMark  Shogry M.D.   On: 03/22/2017 12:03     Telemetry    NSR with no arrhythmias - Personally Reviewed  ECG    NSR with IRBBB and early repolarization - Personally Reviewed   ASSESSMENT/PLAN:   1.  Chest pain syndrome that is atypical in that it is nonexertional and not worsened by exertion.  It is sharp and stabbing - no associated sx with his pain and it resolved with narcotics - Trop negative x 3 - D-Dimer < 0.27 - EKG with early repol and unchanged from EKG 2013.   - His CRFs are tobacco abuse - urine drug screen positive for barbiturates and opiates - 2D echo pending - if LVF normal on echo then will make NPO after MN for nuclear stress test in am - since he is noncompliant with seeing doctors - would prefer to keep inpt to get stress test done.   - will make NPO after MN  2.  Polysubstance abuse - needs smoking cessation counseling - nicoderm patch ordered  Armanda Magicraci Jamonte Curfman, MD  03/23/2017  12:42 PM

## 2017-03-23 NOTE — Progress Notes (Signed)
Upon entering room, bathroom smelled like cigarettes. Pt stated "I just had a drag". I told him security has been called and that he cannot smoke in the hospital. He stated he understood and was the only time he would do it. Still has nicotine patch on. Will continue to closely monitor.

## 2017-03-23 NOTE — Progress Notes (Signed)
PROGRESS NOTE   CHEVELLE DURR  WUJ:[id number]    DOB: 12/22/1970    DOA: 03/22/2017  PCP: Patient, No Pcp Per   I have briefly reviewed patients previous medical records in [facility].  Brief Narrative:  47 year old male, has not seen a physician and denies any PMH, polysubstance abuse (alcohol, tobacco, IV heroin, cocaine) presented to ED with chest pain.  Despite almost 24 hours of ongoing chest pain, troponin x3: Negative, EKG shows incomplete RBBB and peaked T waves in lateral leads, new compared to EKG from 2018.  Cardiology consulted, echo pending to decide course of further evaluation.   Assessment & Plan:   Active Problems:   Chest pain   History of substance abuse   Tobacco abuse   1. Chest pain, atypical: EKG: Incomplete RBBB, peaked T waves in leads V4-6 (new compared to EKG from 2017).  Troponin x3: Negative.  D-dimer negative.  Ongoing chest pain.  2D echo report pending.  Cardiology input appreciated and if echo negative, plan stress test 2/24. 2. Polysubstance abuse (alcohol, tobacco, IV heroin, cocaine and opioids): Cessation counseled.  Nicotine patch. CIWA protocol.  Judicious use of opioids.  UDS positive for barbiturates and opiates. 3. Normocytic anemia: Unclear etiology.  Follow CBC in a.m.   DVT prophylaxis: Lovenox Code Status: Full Family Communication: None at bedside Disposition: DC home pending clinical improvement and cardiac evaluation.   Consultants:  Cardiology  Procedures:  None  Antimicrobials:  None   Subjective: Seen this morning.  States that he was awakened at 5:30 AM on 03/22/17 with sharp left precordial pain rated at 9-10/10 in severity, nonradiating, not associated with dyspnea or diaphoresis or nausea or vomiting.  Has not changed much since then.  Not made worse with deep inspiration or chest wall movements.  Denies trauma, long distance travel or lifting anything heavy.  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  Last did IV  heroin couple days PTA.  Also smokes like a "freight train" and clarifies up to 2-3 packs/day.  Reports drinking half of 1/5 of liquor daily.  Last drink was day prior to admission.  ROS: As above  Objective:  Vitals:   03/23/17 0500 03/23/17 0807 03/23/17 0847 03/23/17 1149  BP:  109/75 109/75 128/72  Pulse:  62 68 79  Resp: 16 16  14   Temp:  98.6 F (37 C)  97.6 F (36.4 C)  TempSrc:  Oral  Oral  SpO2:  97%  100%  Weight:      Height:        Examination:  General exam: Pleasant young male, moderately built and nourished, lying comfortably supine in bed. Respiratory system: Clear to auscultation. Respiratory effort normal.  No reproducible chest wall tenderness. Cardiovascular system: S1 & S2 heard, RRR. No JVD, murmurs, rubs, gallops or clicks. No pedal edema.  No pericardial rub.  Telemetry personally reviewed: Sinus rhythm. Gastrointestinal system: Abdomen is nondistended, soft and nontender. No organomegaly or masses felt. Normal bowel sounds heard. Central nervous system: Alert and oriented. No focal neurological deficits. Extremities: Symmetric 5 x 5 power. Skin: No rashes, lesions or ulcers Psychiatry: Judgement and insight appear normal. Mood & affect appropriate.     Data Reviewed: I have personally reviewed following labs and imaging studies  CBC: Recent Labs  Lab 03/22/17 1146 03/22/17 2208  WBC 10.3 8.9  HGB 13.0 11.9*  HCT 38.8* 35.6*  MCV 88.2 88.1  PLT 234 214   Basic Metabolic Panel: Recent Labs  Lab  03/22/17 1146 03/22/17 2208  NA 137  --   K 4.4  --   CL 104  --   CO2 23  --   GLUCOSE 97  --   BUN 20  --   CREATININE 0.88 0.79  CALCIUM 9.1  --    Cardiac Enzymes: Recent Labs  Lab 03/22/17 2208 03/23/17 0103 03/23/17 0400  TROPONINI <0.03 <0.03 <0.03     Recent Results (from the past 240 hour(s))  MRSA PCR Screening     Status: None   Collection Time: 03/22/17  8:26 PM  Result Value Ref Range Status   MRSA by PCR NEGATIVE  NEGATIVE Final    Comment:        The GeneXpert MRSA Assay (FDA approved for NASAL specimens only), is one component of a comprehensive MRSA colonization surveillance program. It is not intended to diagnose MRSA infection nor to guide or monitor treatment for MRSA infections. Performed at [facility], [address] [place], [place] [address]          Radiology Studies: Dg Chest 2 View  Result Date: 03/22/2017 CLINICAL DATA:  Chest pain beginning today.  Smoking history. EXAM: CHEST  2 VIEW COMPARISON:  04/22/2016 FINDINGS: Heart size is normal. Mediastinal shadows are normal. The lungs are clear. No bronchial thickening. No infiltrate, mass, effusion or collapse. Pulmonary vascularity is normal. No bony abnormality. IMPRESSION: Normal chest Electronically Signed   By: Paulina FusiMark  Shogry M.D.   On: 03/22/2017 12:03        Scheduled Meds: . enoxaparin (LOVENOX) injection  40 mg Subcutaneous Q24H  . folic acid  1 mg Oral Daily  . LORazepam  0-4 mg Oral Q6H   Followed by  . [START ON 03/25/2017] LORazepam  0-4 mg Oral Q12H  . multivitamin with minerals  1 tablet Oral Daily  . nicotine  21 mg Transdermal Daily  . thiamine  100 mg Oral Daily   Or  . thiamine  100 mg Intravenous Daily   Continuous Infusions:   LOS: 0 days     Marcellus ScottAnand Tenaya Hilyer, MD, FACP, [facility]. [facility] Pager [phone]  If 7PM-7AM, please contact night-coverage [email or web address] Password [email or web address] 03/23/2017, 2:17 PM

## 2017-03-23 NOTE — Progress Notes (Signed)
  Echocardiogram 2D Echocardiogram has been performed.  Nicholas [name], Nicholas [name] 03/23/2017, 10:09 AM

## 2017-03-23 NOTE — Progress Notes (Signed)
Patient has been receiving dilaudid q2h for complaints of chest pain.  Per patient left side sharp constant chest pain rating 9/10.  Per patient dilaudid decreases pain to an 8/10.  EKG completed at 2033 when patient arrived to unit due to complaints of chest pain.  Morning EKG completed at 0537.  [facility] paged with this information.

## 2017-03-24 ENCOUNTER — Observation Stay (HOSPITAL_COMMUNITY): Payer: Medicaid Other

## 2017-03-24 ENCOUNTER — Observation Stay (HOSPITAL_BASED_OUTPATIENT_CLINIC_OR_DEPARTMENT_OTHER): Payer: Medicaid Other

## 2017-03-24 DIAGNOSIS — F10239 Alcohol dependence with withdrawal, unspecified: Secondary | ICD-10-CM | POA: Diagnosis not present

## 2017-03-24 DIAGNOSIS — N5082 Scrotal pain: Secondary | ICD-10-CM | POA: Diagnosis present

## 2017-03-24 DIAGNOSIS — I253 Aneurysm of heart: Secondary | ICD-10-CM | POA: Diagnosis present

## 2017-03-24 DIAGNOSIS — G8929 Other chronic pain: Secondary | ICD-10-CM | POA: Diagnosis present

## 2017-03-24 DIAGNOSIS — R079 Chest pain, unspecified: Secondary | ICD-10-CM | POA: Diagnosis present

## 2017-03-24 DIAGNOSIS — Z9119 Patient's noncompliance with other medical treatment and regimen: Secondary | ICD-10-CM | POA: Diagnosis not present

## 2017-03-24 DIAGNOSIS — Z765 Malingerer [conscious simulation]: Secondary | ICD-10-CM | POA: Diagnosis not present

## 2017-03-24 DIAGNOSIS — F1410 Cocaine abuse, uncomplicated: Secondary | ICD-10-CM | POA: Diagnosis present

## 2017-03-24 DIAGNOSIS — Z811 Family history of alcohol abuse and dependence: Secondary | ICD-10-CM | POA: Diagnosis not present

## 2017-03-24 DIAGNOSIS — D649 Anemia, unspecified: Secondary | ICD-10-CM | POA: Diagnosis present

## 2017-03-24 DIAGNOSIS — F1910 Other psychoactive substance abuse, uncomplicated: Secondary | ICD-10-CM | POA: Diagnosis not present

## 2017-03-24 DIAGNOSIS — R0789 Other chest pain: Secondary | ICD-10-CM | POA: Diagnosis present

## 2017-03-24 DIAGNOSIS — R1110 Vomiting, unspecified: Secondary | ICD-10-CM | POA: Diagnosis not present

## 2017-03-24 DIAGNOSIS — N5089 Other specified disorders of the male genital organs: Secondary | ICD-10-CM | POA: Diagnosis present

## 2017-03-24 DIAGNOSIS — F17210 Nicotine dependence, cigarettes, uncomplicated: Secondary | ICD-10-CM | POA: Diagnosis present

## 2017-03-24 DIAGNOSIS — M549 Dorsalgia, unspecified: Secondary | ICD-10-CM | POA: Diagnosis present

## 2017-03-24 DIAGNOSIS — F1110 Opioid abuse, uncomplicated: Secondary | ICD-10-CM | POA: Diagnosis present

## 2017-03-24 DIAGNOSIS — Q211 Atrial septal defect: Secondary | ICD-10-CM | POA: Diagnosis not present

## 2017-03-24 DIAGNOSIS — R569 Unspecified convulsions: Secondary | ICD-10-CM

## 2017-03-24 DIAGNOSIS — F10230 Alcohol dependence with withdrawal, uncomplicated: Secondary | ICD-10-CM | POA: Diagnosis not present

## 2017-03-24 DIAGNOSIS — I4519 Other right bundle-branch block: Secondary | ICD-10-CM | POA: Diagnosis present

## 2017-03-24 LAB — NM MYOCAR MULTI W/SPECT W/WALL MOTION / EF
CSEPEDS: 55 s
CSEPPHR: 117 {beats}/min
Estimated workload: 1 METS
Exercise duration (min): 4 min
Rest HR: 64 {beats}/min

## 2017-03-24 LAB — CBC
HCT: 40.6 % (ref 39.0–52.0)
HEMOGLOBIN: 14 g/dL (ref 13.0–17.0)
MCH: 29.4 pg (ref 26.0–34.0)
MCHC: 34.5 g/dL (ref 30.0–36.0)
MCV: 85.1 fL (ref 78.0–100.0)
PLATELETS: 263 10*3/uL (ref 150–400)
RBC: 4.77 MIL/uL (ref 4.22–5.81)
RDW: 13.9 % (ref 11.5–15.5)
WBC: 12.2 10*3/uL — ABNORMAL HIGH (ref 4.0–10.5)

## 2017-03-24 LAB — ECHOCARDIOGRAM LIMITED BUBBLE STUDY
HEIGHTINCHES: 72 in
WEIGHTICAEL: 2130.53 [oz_av]

## 2017-03-24 LAB — HEMOGLOBIN A1C
HEMOGLOBIN A1C: 5.4 % (ref 4.8–5.6)
Mean Plasma Glucose: 108 mg/dL

## 2017-03-24 MED ORDER — ASPIRIN EC 81 MG PO TBEC
81.0000 mg | DELAYED_RELEASE_TABLET | Freq: Every day | ORAL | Status: DC
  Administered 2017-03-25 – 2017-03-26 (×2): 81 mg via ORAL
  Filled 2017-03-24 (×2): qty 1

## 2017-03-24 MED ORDER — PANTOPRAZOLE SODIUM 40 MG IV SOLR
40.0000 mg | INTRAVENOUS | Status: DC
  Administered 2017-03-24 – 2017-03-25 (×2): 40 mg via INTRAVENOUS
  Filled 2017-03-24 (×2): qty 40

## 2017-03-24 MED ORDER — KETOROLAC TROMETHAMINE 15 MG/ML IJ SOLN
15.0000 mg | Freq: Three times a day (TID) | INTRAMUSCULAR | Status: AC | PRN
  Administered 2017-03-24 – 2017-03-25 (×3): 15 mg via INTRAVENOUS
  Filled 2017-03-24 (×3): qty 1

## 2017-03-24 MED ORDER — REGADENOSON 0.4 MG/5ML IV SOLN
0.4000 mg | Freq: Once | INTRAVENOUS | Status: AC
  Administered 2017-03-24: 0.4 mg via INTRAVENOUS
  Filled 2017-03-24: qty 5

## 2017-03-24 MED ORDER — TECHNETIUM TC 99M TETROFOSMIN IV KIT
10.0000 | PACK | Freq: Once | INTRAVENOUS | Status: AC | PRN
  Administered 2017-03-24: 10 via INTRAVENOUS

## 2017-03-24 MED ORDER — ALUM & MAG HYDROXIDE-SIMETH 200-200-20 MG/5ML PO SUSP: 30.0000 mL | ORAL | Status: DC | PRN

## 2017-03-24 MED ORDER — LORAZEPAM 2 MG/ML IJ SOLN
INTRAMUSCULAR | Status: AC
  Filled 2017-03-24: qty 1

## 2017-03-24 MED ORDER — MORPHINE SULFATE (PF) 2 MG/ML IV SOLN
1.0000 mg | Freq: Four times a day (QID) | INTRAVENOUS | Status: DC | PRN
  Administered 2017-03-25 – 2017-03-26 (×2): 1 mg via INTRAVENOUS
  Filled 2017-03-24 (×2): qty 1

## 2017-03-24 MED ORDER — REGADENOSON 0.4 MG/5ML IV SOLN
INTRAVENOUS | Status: AC
  Filled 2017-03-24: qty 5

## 2017-03-24 MED ORDER — TECHNETIUM TC 99M TETROFOSMIN IV KIT
30.0000 | PACK | Freq: Once | INTRAVENOUS | Status: AC | PRN
  Administered 2017-03-24: 30 via INTRAVENOUS

## 2017-03-24 NOTE — Progress Notes (Signed)
Patient's bed alarm went off and he was in the bathroom with the door shut before we could get there. He stated that he was fine. Part of a cigarette and a lighter was placed above the toilet.  When confronted patient denied smoking.

## 2017-03-24 NOTE — Progress Notes (Signed)
Nuclear stress test results reviewed: IMPRESSION: 1. There are fixed defects at the anterolateral apex and basilar septum. No large fixed perfusion defects. No stress-induced ischemia.  2. Septal hypokinesis.  3. Left ventricular ejection fraction 53%  4. Non invasive risk stratification*: Low risk.  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  The finding on prior echo with ? 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  There is a small PFO with right to left shunting.   Patient's chest pain is atypical and sharp and has been constant for days without any enzyme elevation and only improves with narcotics.  ? Drug seeking behavior.    Consider addition of ASA. Otherwise no further cardiac workup.  No TEE indicated.  Will sign off.

## 2017-03-24 NOTE — Progress Notes (Signed)
  Echocardiogram 2D Echocardiogram has been performed.  Nicholas [name] [name] 03/24/2017, 12:00 PM

## 2017-03-24 NOTE — Progress Notes (Signed)
PROGRESS NOTE   ABID BOLLA  ONG:[id number]    DOB: 1970/07/28    DOA: 03/22/2017  PCP: Patient, No Pcp Per   I have briefly reviewed patients previous medical records in [facility]  Brief Narrative:  47 year old male, has not seen a physician and denies any PMH, polysubstance abuse (alcohol, tobacco, IV heroin, cocaine) presented to ED with chest pain.  Despite almost 24 hours of ongoing chest pain, troponin x3: Negative, EKG shows incomplete RBBB and peaked T waves in lateral leads, new compared to EKG from 2018.  Cardiology consulted.  Nuclear stress test low risk and cardiology signed off.  Ongoing alcohol withdrawal.  Assessment & Plan:   Active Problems:   Chest pain   History of substance abuse   Tobacco abuse   1. Chest pain, atypical: EKG: Incomplete RBBB, peaked T waves in leads V4-6 (new compared to EKG from 2017).  Troponin x3: Negative.  D-dimer negative.  TTE 2/23: LVEF 50-55%.  A large atrial septal aneurysm, with 3 respirophasic mobility between right and left atrial cavities and cannot tell if associated thrombus present.  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  No thrombus).  Small PFO with right-to-left shunting.  No further TEE evaluation needed.  Nuclear stress test: Low risk.  As per cardiology sign off, consider adding aspirin 81 mg daily, no TEE indicated, otherwise no further cardiac workup.  Ongoing chest pain likely noncardiac, possibly muscular versus GI.  Minimize opioids.  Trial of NSAIDs, heating pad, PPI and antacids. 2. Polysubstance abuse (alcohol, tobacco, IV heroin, cocaine and opioids)/alcohol withdrawal: Cessation counseled.  Nicotine patch. CIWA protocol.  Judicious use of opioids-minimize.  UDS positive for barbiturates and opiates.  Started having features of alcohol withdrawal 2/23 with high CIWA's.  In addition to Ativan per CIWA protocol, scheduled Ativan 2 mg 3 times daily added.   Improved.  Some concern regarding opioid seeking behavior.  Patient was found smoking in his toilet and an empty syringe was noted at bedside. 3. Normocytic anemia: Unclear etiology..  Hemoglobin today is 14.  11.9 yesterday was likely a lab error.   DVT prophylaxis: Lovenox Code Status: Full Family Communication: None at bedside Disposition: DC home pending clinical improvement.   Consultants:  Cardiology  Procedures:  None  Antimicrobials:  None   Subjective: Patient not seen this morning because he was in nuclear lab for stress test.  Seen this afternoon after stress test.  Ongoing chest pain.  Somnolent but easily arousable.  Had couple of episodes of nonbloody emesis yesterday afternoon and last night.  Noted some at bedside on the floor and no obvious coffee-ground.  No blood.  No dyspnea reported.  ROS: As above  Objective:  Vitals:   03/24/17 0926 03/24/17 0928 03/24/17 1045 03/24/17 1447  BP: 125/66 139/66 127/74 128/78  Pulse: 96 (!) 111  97  Resp:   (!) 21 (!) 22  Temp:    99.5 F (37.5 C)  TempSrc:    Axillary  SpO2:      Weight:      Height:        Examination:  General exam: Pleasant young male, moderately built and nourished, lying comfortably supine in bed.  Does not appear in any distress. Respiratory system: Clear to auscultation. Respiratory effort normal.  No reproducible chest wall tenderness.  Stable without change. Cardiovascular system: S1 & S2 heard, RRR. No JVD, murmurs, rubs, gallops or clicks. No  pedal edema.  No pericardial rub.  Telemetry personally reviewed: Sinus rhythm and occasional accelerated junctional rhythm. Gastrointestinal system: Abdomen is nondistended, soft and nontender. No organomegaly or masses felt. Normal bowel sounds heard.  Stable without change. Central nervous system: Sleepy but easily arousable and oriented. No focal neurological deficits. Extremities: Symmetric 5 x 5 power. Skin: No rashes, lesions or  ulcers Psychiatry: Judgement and insight appear normal. Mood & affect appropriate.     Data Reviewed: I have personally reviewed following labs and imaging studies  CBC: Recent Labs  Lab 03/22/17 1146 03/22/17 2208 03/24/17 0310  WBC 10.3 8.9 12.2*  HGB 13.0 11.9* 14.0  HCT 38.8* 35.6* 40.6  MCV 88.2 88.1 85.1  PLT 234 214 263   Basic Metabolic Panel: Recent Labs  Lab 03/22/17 1146 03/22/17 2208  NA 137  --   K 4.4  --   CL 104  --   CO2 23  --   GLUCOSE 97  --   BUN 20  --   CREATININE 0.88 0.79  CALCIUM 9.1  --    Cardiac Enzymes: Recent Labs  Lab 03/22/17 2208 03/23/17 0103 03/23/17 0400  TROPONINI <0.03 <0.03 <0.03     Recent Results (from the past 240 hour(s))  MRSA PCR Screening     Status: None   Collection Time: 03/22/17  8:26 PM  Result Value Ref Range Status   MRSA by PCR NEGATIVE NEGATIVE Final    Comment:        The GeneXpert MRSA Assay (FDA approved for NASAL specimens only), is one component of a comprehensive MRSA colonization surveillance program. It is not intended to diagnose MRSA infection nor to guide or monitor treatment for MRSA infections. Performed at [facility], [address], [place], [place] [address]          Radiology Studies: Nm Myocar Multi W/spect W/wall Motion / Ef  Result Date: 03/24/2017 CLINICAL DATA:  ACS, possible, negative troponin EXAM: MYOCARDIAL IMAGING WITH SPECT (REST AND PHARMACOLOGIC-STRESS) GATED LEFT VENTRICULAR WALL MOTION STUDY LEFT VENTRICULAR EJECTION FRACTION TECHNIQUE: Standard myocardial SPECT imaging was performed after resting intravenous injection of 10 mCi Tc-49m tetrofosmin. Subsequently, intravenous infusion of Lexiscan was performed under the supervision of the Cardiology staff. At peak effect of the drug, 30 mCi Tc-14m tetrofosmin was injected intravenously and standard myocardial SPECT imaging was performed. Quantitative gated imaging was also performed to evaluate left  ventricular wall motion, and estimate left ventricular ejection fraction. COMPARISON:  None. FINDINGS: Perfusion: Small fixed defect at the anterolateral apex. There is also fixed thinning of the basilar septum which is moderate in size. No large defects. No stress-induced ischemia. Wall Motion: There is septal hypokinesis Left Ventricular Ejection Fraction: 53 % End diastolic volume 123 ml End systolic volume 58 ml IMPRESSION: 1. There are fixed defects at the anterolateral apex and basilar septum. No large fixed perfusion defects. No stress-induced ischemia. 2. Septal hypokinesis. 3. Left ventricular ejection fraction 53% 4. Non invasive risk stratification*: Low risk. *2012 Appropriate Use Criteria for Coronary Revascularization Focused Update: J Am Coll Cardiol. 2012;59(9):857-881. http://content.dementiazones.com.aspx?articleid=1201161 Electronically Signed   By: Jolaine Click M.D.   On: 03/24/2017 12:48        Scheduled Meds: . enoxaparin (LOVENOX) injection  40 mg Subcutaneous Q24H  . folic acid  1 mg Oral Daily  . LORazepam  0-4 mg Oral Q6H   Followed by  . [START ON 03/25/2017] LORazepam  0-4 mg Oral Q12H  . LORazepam  2  mg Oral TID  . multivitamin with minerals  1 tablet Oral Daily  . nicotine  21 mg Transdermal Daily  . regadenoson      . thiamine  100 mg Oral Daily   Or  . thiamine  100 mg Intravenous Daily   Continuous Infusions:   LOS: 0 days     Marcellus ScottAnand Tashona Calk, MD, FACP, [facility]. [facility] Pager [phone]  If 7PM-7AM, please contact night-coverage [email or web address] Password [email or web address] 03/24/2017, 3:12 PM

## 2017-03-24 NOTE — Progress Notes (Signed)
Patient presented for Lexiscan. Tolerated procedure well. Pending final stress imaging result.  

## 2017-03-24 NOTE — CV Procedure (Signed)
Attempted stat 2D echo, patient not in room, went for other test, will try again at a later time.  Nicholas [name]

## 2017-03-25 DIAGNOSIS — F1910 Other psychoactive substance abuse, uncomplicated: Secondary | ICD-10-CM

## 2017-03-25 DIAGNOSIS — F10230 Alcohol dependence with withdrawal, uncomplicated: Secondary | ICD-10-CM

## 2017-03-25 LAB — HEPATITIS B SURFACE ANTIGEN: Hepatitis B Surface Ag: NEGATIVE

## 2017-03-25 MED ORDER — LORAZEPAM 1 MG PO TABS
1.0000 mg | ORAL_TABLET | Freq: Three times a day (TID) | ORAL | Status: DC
  Administered 2017-03-25: 1 mg via ORAL
  Filled 2017-03-25: qty 1

## 2017-03-25 NOTE — Progress Notes (Signed)
PROGRESS NOTE   Nicholas [name]  ZOX:[id number]    DOB: 12/12/1970    DOA: 03/22/2017  PCP: Patient, No Pcp Per   I have briefly reviewed patients previous medical [facility].  Brief Narrative:  47 year old male, has not seen a physician and denies any PMH, polysubstance abuse (alcohol, tobacco, IV heroin, cocaine) presented to ED with chest pain.  Despite almost 24 hours of ongoing chest pain, troponin x3: Negative, EKG shows incomplete RBBB and peaked T waves in lateral leads, new compared to EKG from 2018.  Cardiology consulted.  Nuclear stress test low risk and cardiology signed off.  Ongoing alcohol withdrawal. Slowly improving.  Assessment & Plan:   Active Problems:   Chest pain   History of substance abuse   Tobacco abuse   1. Chest pain, atypical: EKG: Incomplete RBBB, peaked T waves in leads V4-6 (new compared to EKG from 2017).  Troponin x3: Negative.  D-dimer negative.  TTE 2/23: LVEF 50-55%.  A large atrial septal aneurysm, with 3 respirophasic mobility between right and left atrial cavities and cannot tell if associated thrombus present.  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  No thrombus).  Small PFO with right-to-left shunting.  No further TEE evaluation needed.  Nuclear stress test: Low risk.  As per cardiology sign off, consider adding aspirin 81 mg daily, no TEE indicated, otherwise no further cardiac workup.  Ongoing chest pain likely noncardiac, possibly muscular versus GI.  Minimize opioids.  Trial of NSAIDs, heating pad, PPI and antacids. Still reports ongoing pains but does not appear in any distress or discomfort. 2. Polysubstance abuse (alcohol, tobacco, IV heroin, cocaine and opioids)/alcohol withdrawal: Cessation counseled.  Nicotine patch. CIWA protocol.  Judicious use of opioids-minimize.  UDS positive for barbiturates and opiates.  Started having features of alcohol withdrawal 2/23 with  high CIWA's.  In addition to Ativan per CIWA protocol, scheduled Ativan 2 mg 3 times daily added.  Improved.  Some concern regarding opioid seeking behavior.  Patient was found smoking in his toilet and an empty syringe was noted at bedside. Taper scheduled Ativan to 1 mg tid. Monitor closely. 3. Normocytic anemia: Unclear etiology..  Hemoglobin today is 14.  11.9 yesterday was likely a lab error.   DVT prophylaxis: Lovenox Code Status: Full Family Communication: None at bedside Disposition: DC home pending clinical improvement, possibly next 1-2 days   Consultants:  Cardiology  Procedures:  None  Antimicrobials:  None   Subjective: Ongoing pains, chest and everywhere. Nausea but no vomiting since last night per RN report.   ROS: As above  Objective:  Vitals:   03/24/17 1447 03/24/17 2027 03/25/17 0354 03/25/17 1252  BP: 128/78 (!) 153/75 125/86 127/71  Pulse: 97 67 (!) 58 67  Resp: (!) 22 15 (!) 22   Temp: 99.5 F (37.5 C) 99.3 F (37.4 C) 99.4 F (37.4 C) 98.5 F (36.9 C)  TempSrc: Axillary Oral Oral Oral  SpO2:  98% 97% 100%  Weight:   59.1 kg (130 lb 6.4 oz)   Height:        Examination:  General exam: Pleasant young male, moderately built and nourished, lying comfortably supine in bed.  Does not appear in any distress or discomfort. Respiratory system: Clear to auscultation. Respiratory effort normal.  No reproducible chest wall tenderness.  Stable without change. Cardiovascular system: S1 & S2 heard, RRR. No JVD, murmurs, rubs, gallops or clicks. No pedal  edema.  No pericardial rub.  Telemetry personally reviewed: SR-SB in high 50's Gastrointestinal system: Abdomen is nondistended, soft and nontender. No organomegaly or masses felt. Normal bowel sounds heard.  Stable without change. Central nervous system: Alert and oriented. No focal neurological deficits. Extremities: Symmetric 5 x 5 power. Skin: No rashes, lesions or ulcers Psychiatry: Judgement and  insight appear normal. Mood & affect appropriate.     Data Reviewed: I have personally reviewed following labs and imaging studies  CBC: Recent Labs  Lab 03/22/17 1146 03/22/17 2208 03/24/17 0310  WBC 10.3 8.9 12.2*  HGB 13.0 11.9* 14.0  HCT 38.8* 35.6* 40.6  MCV 88.2 88.1 85.1  PLT 234 214 263   Basic Metabolic Panel: Recent Labs  Lab 03/22/17 1146 03/22/17 2208  NA 137  --   K 4.4  --   CL 104  --   CO2 23  --   GLUCOSE 97  --   BUN 20  --   CREATININE 0.88 0.79  CALCIUM 9.1  --    Cardiac Enzymes: Recent Labs  Lab 03/22/17 2208 03/23/17 0103 03/23/17 0400  TROPONINI <0.03 <0.03 <0.03     Recent Results (from the past 240 hour(s))  MRSA PCR Screening     Status: None   Collection Time: 03/22/17  8:26 PM  Result Value Ref Range Status   MRSA by PCR NEGATIVE NEGATIVE Final    Comment:        The GeneXpert MRSA Assay (FDA approved for NASAL specimens only), is one component of a comprehensive MRSA colonization surveillance program. It is not intended to diagnose MRSA infection nor to guide or monitor treatment for MRSA infections. Performed at [facility], [address] [place], [place] [address]          Radiology Studies: Nm Myocar Multi W/spect W/wall Motion / Ef  Result Date: 03/24/2017 CLINICAL DATA:  ACS, possible, negative troponin EXAM: MYOCARDIAL IMAGING WITH SPECT (REST AND PHARMACOLOGIC-STRESS) GATED LEFT VENTRICULAR WALL MOTION STUDY LEFT VENTRICULAR EJECTION FRACTION TECHNIQUE: Standard myocardial SPECT imaging was performed after resting intravenous injection of 10 mCi Tc-87m tetrofosmin. Subsequently, intravenous infusion of Lexiscan was performed under the supervision of the Cardiology staff. At peak effect of the drug, 30 mCi Tc-69m tetrofosmin was injected intravenously and standard myocardial SPECT imaging was performed. Quantitative gated imaging was also performed to evaluate left ventricular wall motion, and estimate  left ventricular ejection fraction. COMPARISON:  None. FINDINGS: Perfusion: Small fixed defect at the anterolateral apex. There is also fixed thinning of the basilar septum which is moderate in size. No large defects. No stress-induced ischemia. Wall Motion: There is septal hypokinesis Left Ventricular Ejection Fraction: 53 % End diastolic volume 123 ml End systolic volume 58 ml IMPRESSION: 1. There are fixed defects at the anterolateral apex and basilar septum. No large fixed perfusion defects. No stress-induced ischemia. 2. Septal hypokinesis. 3. Left ventricular ejection fraction 53% 4. Non invasive risk stratification*: Low risk. *2012 Appropriate Use Criteria for Coronary Revascularization Focused Update: J Am Coll Cardiol. 2012;59(9):857-881. http://content.dementiazones.com.aspx?articleid=1201161 Electronically Signed   By: Jolaine Click M.D.   On: 03/24/2017 12:48        Scheduled Meds: . aspirin EC  81 mg Oral Daily  . enoxaparin (LOVENOX) injection  40 mg Subcutaneous Q24H  . folic acid  1 mg Oral Daily  . LORazepam  0-4 mg Oral Q12H  . LORazepam  1 mg Oral TID  . multivitamin with minerals  1 tablet Oral Daily  .  nicotine  21 mg Transdermal Daily  . pantoprazole (PROTONIX) IV  40 mg Intravenous Q24H  . thiamine  100 mg Oral Daily   Or  . thiamine  100 mg Intravenous Daily   Continuous Infusions:   LOS: 1 day     Marcellus Scott, MD, FACP, [facility]. [facility] Pager [phone]  If 7PM-7AM, please contact night-coverage [email or web address] Password [email or web address] 03/25/2017, 3:00 PM

## 2017-03-25 NOTE — Care Management Note (Addendum)
Case Management Note  Patient Details  Name: Nicholas [name] MRN: [id number] Date of Birth: 03/22/1970  Subjective/Objective:  Pt presented for Chest Pain-hx of polysubstance abuse. Now on CIWA Protocol. No PCP listed- [place] [facility] [place]- however [place] in [place]                   Action/Plan: CM did speak with the [place] Liaison for  [facility] in regards to PCP needs. CM called the Case Manager with [facility] and list was emailed for PCP needs. CM will offer patient the list and try to work with pt in regards to which office he wants to utilize for PCP in order for CM to schedule. CSW to provide resources for polysubstance abuse. No further needs from CM at this time.   Expected Discharge Date:                  Expected Discharge Plan:  Home/Self Care  In-House Referral:  Clinical Social Work, PCP / Research officer, [facility]  Discharge planning Services  CM Consult  Post Acute Care Choice:  NA Choice offered to:  NA  DME Arranged:  N/A DME Agency:  NA  HH Arranged:  NA HH Agency:  NA  Status of Service:  Completed, signed off  If discussed at [employer] Length of Stay Meetings, dates discussed:    Additional Comments: 1144 03-25-17 Nicholas BambergerBrenda Graves-Bigelow, RN, BSN [phone] CM scheduled hospital f/[facility] with [facility]-appointment placed on AVS. CM will fax d/c information to the Office @ [phone]. No further needs from CM at this time.    1128 03-25-17 Nicholas [name], [place] [phone] Per pt he would [place] have [place] because he can uses his mom's car. CM did provide the pt [facility]  and pt has no preference. Pt just stated to be schedule with any provider accepting Medicaid.  Gala LewandowskyGraves-Bigelow, Cleda Imel Kaye, RN 03/25/2017, 10:42 AM

## 2017-03-25 NOTE — Plan of Care (Signed)
  Pain Managment: General experience of comfort will improve 03/25/2017 2312 - Not Progressing by Viviano SimasNewell, Lois Slagel N, RN Note Pt constantly requesting pain medication. Pain is still uncontrolled despite IV morphine. Pt states pain is in chest that does not radiate. Trops have been negative and nuclear stress test low risk. Will continue pain analgesic as ordered and continue to monitor.

## 2017-03-25 NOTE — Plan of Care (Signed)
Pt remains free from injury

## 2017-03-26 MED ORDER — ADULT MULTIVITAMIN W/MINERALS CH: 1.0000 | ORAL_TABLET | Freq: Every day | ORAL | Status: DC

## 2017-03-26 MED ORDER — FOLIC ACID 1 MG PO TABS: 1.0000 mg | ORAL_TABLET | Freq: Every day | ORAL | 0 refills | Status: DC

## 2017-03-26 MED ORDER — ASPIRIN 81 MG PO TBEC: 81.0000 mg | DELAYED_RELEASE_TABLET | Freq: Every day | ORAL | 0 refills | Status: DC

## 2017-03-26 MED ORDER — THIAMINE HCL 100 MG PO TABS: 100.0000 mg | ORAL_TABLET | Freq: Every day | ORAL | 0 refills | Status: DC

## 2017-03-26 MED ORDER — KETOROLAC TROMETHAMINE 15 MG/ML IJ SOLN: 15.0000 mg | Freq: Four times a day (QID) | INTRAMUSCULAR | Status: DC | PRN

## 2017-03-26 MED ORDER — KETOROLAC TROMETHAMINE 30 MG/ML IJ SOLN
30.0000 mg | Freq: Once | INTRAMUSCULAR | Status: AC
  Administered 2017-03-26: 30 mg via INTRAVENOUS
  Filled 2017-03-26: qty 1

## 2017-03-26 NOTE — Discharge Summary (Addendum)
Physician Discharge Summary  Nicholas [name] ZOX:[id number] DOB: 01/31/1970  PCP: Patient, No Pcp Per  Admit date: 03/22/2017 Discharge date: 03/26/2017  Recommendations for Outpatient Follow-up:  1. Dr. Leilani Able on 04/03/17 at 10 AM. 2. [facility]: Patient advised to call for appointment to evaluate scrotal pain/discomfort. 3. Clinical social work has provided patient with list of residential and outpatient substance use treatment sources.  Home Health: None Equipment/Devices: None  Discharge Condition: Improved and stable CODE STATUS: Full Diet recommendation: Heart healthy diet.  Discharge Diagnoses:  Active Problems:   Chest pain   History of substance abuse   Tobacco abuse   Polysubstance abuse (HCC)   Alcohol dependence with uncomplicated withdrawal [facility])   Brief Summary: 47 year old male, has not seen a physician and denies any PMH, polysubstance abuse (alcohol, tobacco, IV heroin, cocaine) presented to ED with chest pain.  Despite almost 24 hours of ongoing chest pain, troponin x3: Negative, EKG shows incomplete RBBB and peaked T waves in lateral leads, new compared to EKG from 2018.  Cardiology consulted.  Nuclear stress test low risk and cardiology signed off.    Assessment & Plan:   1. Chest pain, atypical: EKG: Incomplete RBBB, peaked T waves in leads V4-6 (new compared to EKG from 2017).  Troponin x3: Negative.  D-dimer negative.  TTE 2/23: LVEF 50-55%.  A large atrial septal aneurysm, with free respirophasic mobility between right and left atrial cavities and cannot tell if associated thrombus present.  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  No thrombus).  Small PFO with right-to-left shunting.  No further TEE evaluation needed.  Nuclear stress test: Low risk.  As per Cardiology recommendations, added aspirin 81 mg daily, no TEE indicated, otherwise no further cardiac workup.  Ongoing chest  pain most likely noncardiac, possibly muscular versus GI.  Minimize opioids.  Trial of NSAIDs, heating pad, PPI and antacids.  Reports ongoing pains at multiple places, chest, right flank, scrotum and legs.  Some concern regarding medication seeking behavior.  Despite all these pains, he has not appeared in any discomfort. 2. Polysubstance abuse (alcohol, tobacco, IV heroin, cocaine and opioids)/alcohol withdrawal: Cessation counseled.  Nicotine patch while hospitalized. CIWA protocol.  Judicious use of opioids-minimized.  UDS positive for barbiturates and opiates.  Some concern regarding opioid seeking behavior.  Patient was found smoking in his toilet and an empty syringe was noted at bedside.  Noted alcohol withdrawal signs and symptoms and treated per CIWA protocol including scheduled and as needed Ativan.  Despite CIWA scores being high, 13-15, he does not appear in overt withdrawal.  Offered to continue CIWA with tapering down of Ativan and monitoring for another day and possible discharge tomorrow.  However patient insists on going home today and declines further hospital stay.  He has been counseled extensively and repeatedly regarding abstinence from all substance abuse and he verbalizes understanding.  Clinical social worker has provided him with outpatient resources for substance abuse.  Patient has also been counseled that someone has to pick him up from the hospital and he should not drive for the rest of the day today due to receiving benzodiazepines and opioids and unsafe to drive.  He again verbalized understanding. 3. Normocytic anemia: Unclear etiology..  Hemoglobin today is 14.  11.9 yesterday was likely a lab error. 4. Scrotal pain: Patient reported trauma several weeks ago when a ladder that he was carrying hit him on the groin.  Since  then patient reports some scrotal swelling and pain.  No hematuria reported.  Apart from a ? mass in the scrotum, no acute findings.  Provided details for  outpatient Urology consultation and patient advised to make sure that he has it evaluated.   Consultants:  Cardiology  Procedures:  None   Discharge Instructions  Discharge Instructions    Call MD for:  difficulty breathing, headache or visual disturbances   Complete by:  As directed    Call MD for:  extreme fatigue   Complete by:  As directed    Call MD for:  persistant dizziness or light-headedness   Complete by:  As directed    Call MD for:  persistant nausea and vomiting   Complete by:  As directed    Call MD for:  severe uncontrolled pain   Complete by:  As directed    Diet - low sodium heart healthy   Complete by:  As directed    Increase activity slowly   Complete by:  As directed        Medication List    TAKE these medications   aspirin 81 MG EC tablet Take 1 tablet (81 mg total) by mouth daily. Start taking on:  03/27/2017   folic acid 1 MG tablet Commonly known as:  FOLVITE Take 1 tablet (1 mg total) by mouth daily. Start taking on:  03/27/2017   multivitamin with minerals Tabs tablet Take 1 tablet by mouth daily. Start taking on:  03/27/2017   thiamine 100 MG tablet Take 1 tablet (100 mg total) by mouth daily. Start taking on:  03/27/2017      Follow-up Information    Leilani Able, MD Follow up on 04/03/2017.   Specialty:  Family Medicine Why:  @ 10:00 am for Hospital Follow Up Appointment- Please bring Medicaid Card, Valid License & $4.09 co pay. If you can not make the scheduled appointment please call the office to make them aware in a timely manner. Contact information: [address] [place] [address] [phone]        [facility]. Schedule an appointment as soon as possible for a visit.   Contact information: [address] [place] [place] [address] [phone]         No Known Allergies    Procedures/Studies: Dg Chest 2 View  Result Date: 03/22/2017 CLINICAL DATA:  Chest pain  beginning today.  Smoking history. EXAM: CHEST  2 VIEW COMPARISON:  04/22/2016 FINDINGS: Heart size is normal. Mediastinal shadows are normal. The lungs are clear. No bronchial thickening. No infiltrate, mass, effusion or collapse. Pulmonary vascularity is normal. No bony abnormality. IMPRESSION: Normal chest Electronically Signed   By: Paulina Fusi M.D.   On: 03/22/2017 12:03   Nm Myocar Multi W/spect W/wall Motion / Ef  Result Date: 03/24/2017 CLINICAL DATA:  ACS, possible, negative troponin EXAM: MYOCARDIAL IMAGING WITH SPECT (REST AND PHARMACOLOGIC-STRESS) GATED LEFT VENTRICULAR WALL MOTION STUDY LEFT VENTRICULAR EJECTION FRACTION TECHNIQUE: Standard myocardial SPECT imaging was performed after resting intravenous injection of 10 mCi Tc-11m tetrofosmin. Subsequently, intravenous infusion of Lexiscan was performed under the supervision of the Cardiology staff. At peak effect of the drug, 30 mCi Tc-100m tetrofosmin was injected intravenously and standard myocardial SPECT imaging was performed. Quantitative gated imaging was also performed to evaluate left ventricular wall motion, and estimate left ventricular ejection fraction. COMPARISON:  None. FINDINGS: Perfusion: Small fixed defect at the anterolateral apex. There is also fixed thinning of the basilar  septum which is moderate in size. No large defects. No stress-induced ischemia. Wall Motion: There is septal hypokinesis Left Ventricular Ejection Fraction: 53 % End diastolic volume 123 ml End systolic volume 58 ml IMPRESSION: 1. There are fixed defects at the anterolateral apex and basilar septum. No large fixed perfusion defects. No stress-induced ischemia. 2. Septal hypokinesis. 3. Left ventricular ejection fraction 53% 4. Non invasive risk stratification*: Low risk. *2012 Appropriate Use Criteria for Coronary Revascularization Focused Update: J Am Coll Cardiol. 2012;59(9):857-881. http://content.dementiazones.comonlinejacc.org/article.aspx?articleid=1201161 Electronically  Signed   By: Jolaine ClickArthur  Hoss M.D.   On: 03/24/2017 12:48      Subjective: I interviewed and examined patient in the presence of his RN.  Ongoing pain at multiple locations including anterior chest, right flank, scrotum and legs.  No nausea or vomiting since yesterday.  Patient asking if he can get some "withdrawal medications" to take with him home.  Discharge Exam:  Vitals:   03/25/17 2032 03/26/17 0100 03/26/17 0628 03/26/17 0738  BP: 123/70 125/72 112/69 137/71  Pulse: (!) 58 67 60 82  Resp: 19 15 (!) 23   Temp: 99.7 F (37.6 C) 98 F (36.7 C) 98.6 F (37 C) 98.6 F (37 C)  TempSrc: Oral Oral Oral Oral  SpO2: 98% 97% 98% 99%  Weight:   60.8 kg (134 lb)   Height:        General exam: Pleasant young male, moderately built and nourished, lying comfortably supine in bed.  Does not appear in any distress or discomfort.  No overt features of withdrawal noted. Respiratory system: Clear to auscultation. Respiratory effort normal.  No reproducible chest wall tenderness.  Cardiovascular system: S1 & S2 heard, RRR. No JVD, murmurs, rubs, gallops or clicks. No pedal edema.  No pericardial rub. Gastrointestinal system: Abdomen is nondistended, soft and nontender. No organomegaly or masses felt. Normal bowel sounds heard.  Genitourinary: Patient was undressed and external genitalia examined.  Apart from 2 normal appearing testicles, patient had an additional lump in his scrotum which was slightly smaller than the size of a testicle with the same consistency.  No tenderness or fluctuance.  Scrotal sac without acute findings.  No erythema, increased warmth, open wounds. Central nervous system: Alert and oriented. No focal neurological deficits. Extremities: Symmetric 5 x 5 power. Skin: No rashes, lesions or ulcers Psychiatry: Judgement and insight appear normal. Mood & affect appropriate.      The results of significant diagnostics from this hospitalization (including imaging, microbiology,  ancillary and laboratory) are listed below for reference.     Microbiology: Recent Results (from the past 240 hour(s))  MRSA PCR Screening     Status: None   Collection Time: 03/22/17  8:26 PM  Result Value Ref Range Status   MRSA by PCR NEGATIVE NEGATIVE Final    Comment:        The GeneXpert MRSA Assay (FDA approved for NASAL specimens only), is one component of a comprehensive MRSA colonization surveillance program. It is not intended to diagnose MRSA infection nor to guide or monitor treatment for MRSA infections. Performed at [facility], [address] [place], [place] [id number]      Labs: CBC: Recent Labs  Lab 03/22/17 1146 03/22/17 2208 03/24/17 0310  WBC 10.3 8.9 12.2*  HGB 13.0 11.9* 14.0  HCT 38.8* 35.6* 40.6  MCV 88.2 88.1 85.1  PLT 234 214 263   Basic Metabolic Panel: Recent Labs  Lab 03/22/17 1146 03/22/17 2208  NA 137  --  K 4.4  --   CL 104  --   CO2 23  --   GLUCOSE 97  --   BUN 20  --   CREATININE 0.88 0.79  CALCIUM 9.1  --    Cardiac Enzymes: Recent Labs  Lab 03/22/17 2208 03/23/17 0103 03/23/17 0400  TROPONINI <0.03 <0.03 <0.03     Time coordinating discharge: Less than 30 minutes  SIGNED:  Marcellus Scott, MD, FACP, [facility]. [facility] Pager [phone]  If 7PM-7AM, please contact night-coverage [email or web address] Password [facility] 03/26/2017, 11:24 AM

## 2017-03-26 NOTE — Clinical Social Work Note (Signed)
Clinical Social Work Assessment  Patient Details  Name: Nicholas [name] MRN: [id number] Date of Birth: [birth year]  Date of referral:  03/26/17               Reason for consult:  Substance Use/ETOH Abuse                Permission sought to share information with:    Permission granted to share information::     Name::        Agency::     Relationship::     Contact Information:     Housing/Transportation Living arrangements for the past 2 months:  Single Family Home Source of Information:  Patient Patient Interpreter Needed:  None Criminal Activity/Legal Involvement Pertinent to Current Situation/Hospitalization:  No - Comment as needed Significant Relationships:  Parents Lives with:  Parents(mother) Do you feel safe going back to the place where you live?  Yes Need for family participation in patient care:  No (Coment)  Care giving concerns: Patient from home with mother. Patient on CIWA and in active ETOH withdrawal. CSW consulted for substance use resources.  Social Worker assessment / plan: CSW met with patient at bedside. Patient alert and oriented, dressed to go home. CSW assessed for substance use. Patient reported use of alcohol and heroin. CSW completed SBIRT with patient. Patient indicated he has been using alcohol for 30 years and heroin for 6 months. Patient has been trying to get into [facility] program for recovery services. Patient pre-contemplative about change process, and discussed that challenges of the withdrawal in recovery process. Patient receptive to community resources. CSW provided list of residential and outpatient substance use treatment resources. CSW also provided information on [facility] program for harm reduction and case management for heroin use. CSW signing off, as no other needs identified.   Employment status:  Therapist, music:  Medicaid In [place] PT Recommendations:  Not assessed at this time Information / Referral to community  resources:  SBIRT, Outpatient Substance Abuse Treatment Options, Residential Substance Abuse Treatment Options  Patient/Family's Response to care: Patient appreciative of resources.  Patient/Family's Understanding of and Emotional Response to Diagnosis, Current Treatment, and Prognosis: Patient with understanding of his condition and has knowledge about recovery and withdrawal. Patient considering follow up with community resources.  Emotional Assessment Appearance:  Appears stated age Attitude/Demeanor/Rapport:  Engaged Affect (typically observed):  Calm Orientation:  Oriented to Self, Oriented to Place, Oriented to  Time, Oriented to Situation Alcohol / Substance use:  Alcohol Use, Tobacco Use, Illicit Drugs Psych involvement (Current and /or in the community):  No (Comment)  Discharge Needs  Concerns to be addressed:  Substance Abuse Concerns, Care Coordination Readmission within the last 30 days:  No Current discharge risk:  Substance Abuse Barriers to Discharge:  Continued Medical Work up   Estanislado Emms, LCSW 03/26/2017, 10:31 AM

## 2017-03-26 NOTE — Discharge Instructions (Signed)
Please get your medications reviewed and adjusted by your Primary MD. ° °Please request your Primary MD to go over all Hospital Tests and Procedure/Radiological results at the follow up, please get all Hospital records sent to your Prim MD by signing hospital release before you go home. ° °If you had Pneumonia of Lung problems at the Hospital: °Please get a 2 view Chest X ray done in 6-8 weeks after hospital discharge or sooner if instructed by your Primary MD. ° °If you have Congestive Heart Failure: °Please call your Cardiologist or Primary MD anytime you have any of the following symptoms:  °1) 3 pound weight gain in 24 hours or 5 pounds in 1 week  °2) shortness of breath, with or without a dry hacking cough  °3) swelling in the hands, feet or stomach  °4) if you have to sleep on extra pillows at night in order to breathe ° °Follow cardiac low salt diet and 1.5 lit/day fluid restriction. ° °If you have diabetes °Accuchecks 4 times/day, Once in AM empty stomach and then before each meal. °Log in all results and show them to your primary doctor at your next visit. °If any glucose reading is under 80 or above 300 call your primary MD immediately. ° °If you have Seizure/Convulsions/Epilepsy: °Please do not drive, operate heavy machinery, participate in activities at heights or participate in high speed sports until you have seen by Primary MD or a Neurologist and advised to do so again. ° °If you had Gastrointestinal Bleeding: °Please ask your Primary MD to check a complete blood count within one week of discharge or at your next visit. Your endoscopic/colonoscopic biopsies that are pending at the time of discharge, will also need to followed by your Primary MD. ° °Get Medicines reviewed and adjusted. °Please take all your medications with you for your next visit with your Primary MD ° °Please request your Primary MD to go over all hospital tests and procedure/radiological results at the follow up, please ask your  Primary MD to get all Hospital records sent to his/her office. ° °If you experience worsening of your admission symptoms, develop shortness of breath, life threatening emergency, suicidal or homicidal thoughts you must seek medical attention immediately by calling 911 or calling your MD immediately  if symptoms less severe. ° °You must read complete instructions/literature along with all the possible adverse reactions/side effects for all the Medicines you take and that have been prescribed to you. Take any new Medicines after you have completely understood and accpet all the possible adverse reactions/side effects.  ° °Do not drive or operate heavy machinery when taking Pain medications.  ° °Do not take more than prescribed Pain, Sleep and Anxiety Medications ° °Special Instructions: If you have smoked or chewed Tobacco  in the last 2 yrs please stop smoking, stop any regular Alcohol  and or any Recreational drug use. ° °Wear Seat belts while driving. ° °Please note °You were cared for by a hospitalist during your hospital stay. If you have any questions about your discharge medications or the care you received while you were in the hospital after you are discharged, you can call the unit and asked to speak with the hospitalist on call if the hospitalist that took care of you is not available. Once you are discharged, your primary care physician will handle any further medical issues. Please note that NO REFILLS for any discharge medications will be authorized once you are discharged, as it is imperative that you   return to your primary care physician (or establish a relationship with a primary care physician if you do not have one) for your aftercare needs so that they can reassess your need for medications and monitor your lab values. ° °You can reach the hospitalist office at phone [phone] or fax [phone] °  °If you do not have a primary care physician, you can call [phone] for a physician  referral. ° ° °Nonspecific Chest Pain °Chest pain can be caused by many different conditions. There is always a chance that your pain could be related to something serious, such as a heart attack or a blood clot in your lungs. Chest pain can also be caused by conditions that are not life-threatening. If you have chest pain, it is very important to follow up with your health care provider. °What are the causes? °Causes of this condition include: °· Heartburn. °· Pneumonia or bronchitis. °· Anxiety or stress. °· Inflammation around your heart (pericarditis) or lung (pleuritis or pleurisy). °· A blood clot in your lung. °· A collapsed lung (pneumothorax). This can develop suddenly on its own (spontaneous pneumothorax) or from trauma to the chest. °· Shingles infection (varicella-zoster virus). °· Heart attack. °· Damage to the bones, muscles, and cartilage that make up your chest wall. This can include: °? Bruised bones due to injury. °? Strained muscles or cartilage due to frequent or repeated coughing or overwork. °? Fracture to one or more ribs. °? Sore cartilage due to inflammation (costochondritis). ° °What increases the risk? °Risk factors for this condition may include: °· Activities that increase your risk for trauma or injury to your chest. °· Respiratory infections or conditions that cause frequent coughing. °· Medical conditions or overeating that can cause heartburn. °· Heart disease or family history of heart disease. °· Conditions or health behaviors that increase your risk of developing a blood clot. °· Having had chicken pox (varicella zoster). ° °What are the signs or symptoms? °Chest pain can feel like: °· Burning or tingling on the surface of your chest or deep in your chest. °· Crushing, pressure, aching, or squeezing pain. °· Dull or sharp pain that is worse when you move, cough, or take a deep breath. °· Pain that is also felt in your back, neck, shoulder, or arm, or pain that spreads to any of  these areas. ° °Your chest pain may come and go, or it may stay constant. °How is this diagnosed? °Lab tests or other studies may be needed to find the cause of your pain. Your health care provider may have you take a test called an ECG (electrocardiogram). An ECG records your heartbeat patterns at the time the test is performed. You may also have other tests, such as: °· Transthoracic echocardiogram (TTE). In this test, sound waves are used to create a picture of the heart structures and to look at how blood flows through your heart. °· Transesophageal echocardiogram (TEE). This is a more advanced imaging test that takes images from inside your body. It allows your health care provider to see your heart in finer detail. °· Cardiac monitoring. This allows your health care provider to monitor your heart rate and rhythm in real time. °· Holter monitor. This is a portable device that records your heartbeat and can help to diagnose abnormal heartbeats. It allows your health care provider to track your heart activity for several days, if needed. °· Stress tests. These can be done through exercise or by taking medicine that makes your heart   beat more quickly. °· Blood tests. °· Other imaging tests. ° °How is this treated? °Treatment depends on what is causing your chest pain. Treatment may include: °· Medicines. These may include: °? Acid blockers for heartburn. °? Anti-inflammatory medicine. °? Pain medicine for inflammatory conditions. °? Antibiotic medicine, if an infection is present. °? Medicines to dissolve blood clots. °? Medicines to treat coronary artery disease (CAD). °· Supportive care for conditions that do not require medicines. This may include: °? Resting. °? Applying heat or cold packs to injured areas. °? Limiting activities until pain decreases. ° °Follow these instructions at home: °Medicines °· If you were prescribed an antibiotic, take it as told by your health care provider. Do not stop taking the  antibiotic even if you start to feel better. °· Take over-the-counter and prescription medicines only as told by your health care provider. °Lifestyle °· Do not use any products that contain nicotine or tobacco, such as cigarettes and e-cigarettes. If you need help quitting, ask your health care provider. °· Do not drink alcohol. °· Make lifestyle changes as directed by your health care provider. These may include: °? Getting regular exercise. Ask your health care provider to suggest some activities that are safe for you. °? Eating a heart-healthy diet. A registered dietitian can help you to learn healthy eating options. °? Maintaining a healthy weight. °? Managing diabetes, if necessary. °? Reducing stress, such as with yoga or relaxation techniques. °General instructions °· Avoid any activities that bring on chest pain. °· If heartburn is the cause for your chest pain, raise (elevate) the head of your bed about 6 inches (15 cm) by putting blocks under the legs. Sleeping with more pillows does not effectively relieve heartburn because it only changes the position of your head. °· Keep all follow-up visits as told by your health care provider. This is important. This includes any further testing if your chest pain does not go away. °Contact a health care provider if: °· Your chest pain does not go away. °· You have a rash with blisters on your chest. °· You have a fever. °· You have chills. °Get help right away if: °· Your chest pain is worse. °· You have a cough that gets worse, or you cough up blood. °· You have severe pain in your abdomen. °· You have severe weakness. °· You faint. °· You have sudden, unexplained chest discomfort. °· You have sudden, unexplained discomfort in your arms, back, neck, or jaw. °· You have shortness of breath at any time. °· You suddenly start to sweat, or your skin gets clammy. °· You feel nauseous or you vomit. °· You suddenly feel light-headed or dizzy. °· Your heart begins to beat  quickly, or it feels like it is skipping beats. °These symptoms may represent a serious problem that is an emergency. Do not wait to see if the symptoms will go away. Get medical help right away. Call your local emergency services (911 in the [place] Do not drive yourself to the hospital. °This information is not intended to replace advice given to you by your health care provider. Make sure you discuss any questions you have with your health care provider. °Document Released: 10/25/2004 Document Revised: 10/10/2015 Document Reviewed: 10/10/2015 °Elsevier Interactive Patient Education © 2017 [employer] ° °

## 2017-03-26 NOTE — Progress Notes (Signed)
Pt left unit without discharge paperwork and prescriptions. RN called friend, Corrie DandyMary, and asked if someone could swing by and get them. RN called the number for the pt on file and did not get an answer. IV was removed prior to discharge. Discharge paperwork at front desk.

## 2017-03-27 LAB — HCV AB W REFLEX TO QUANT PCR: HCV Ab: >11 s/co ratio — ABNORMAL HIGH (ref 0.0–0.9)

## 2017-03-27 LAB — HCV RT-PCR, QUANT (NON-GRAPH): HEPATITIS C QUANTITATION: NOT DETECTED [IU]/mL

## 2017-10-30 ENCOUNTER — Other Ambulatory Visit: Payer: Self-pay

## 2017-10-30 ENCOUNTER — Emergency Department (HOSPITAL_COMMUNITY)
Admission: EM | Admit: 2017-10-30 | Discharge: 2017-10-30 | Disposition: A | Discharge status: Home / Self Care | Payer: Medicaid Other | Attending: Emergency Medicine | Admitting: Emergency Medicine

## 2017-10-30 ENCOUNTER — Encounter (HOSPITAL_COMMUNITY): Payer: Self-pay | Admitting: Emergency Medicine

## 2017-10-30 DIAGNOSIS — R748 Abnormal levels of other serum enzymes: Secondary | ICD-10-CM

## 2017-10-30 DIAGNOSIS — E860 Dehydration: Secondary | ICD-10-CM | POA: Insufficient documentation

## 2017-10-30 DIAGNOSIS — R197 Diarrhea, unspecified: Secondary | ICD-10-CM | POA: Insufficient documentation

## 2017-10-30 DIAGNOSIS — R112 Nausea with vomiting, unspecified: Secondary | ICD-10-CM | POA: Insufficient documentation

## 2017-10-30 DIAGNOSIS — F17210 Nicotine dependence, cigarettes, uncomplicated: Secondary | ICD-10-CM | POA: Diagnosis not present

## 2017-10-30 LAB — CBC
HEMATOCRIT: 40 % (ref 39.0–52.0)
Hemoglobin: 13.2 g/dL (ref 13.0–17.0)
MCH: 29.8 pg (ref 26.0–34.0)
MCHC: 33 g/dL (ref 30.0–36.0)
MCV: 90.3 fL (ref 78.0–100.0)
Platelets: 227 10*3/uL (ref 150–400)
RBC: 4.43 MIL/uL (ref 4.22–5.81)
RDW: 13.4 % (ref 11.5–15.5)
WBC: 10.4 10*3/uL (ref 4.0–10.5)

## 2017-10-30 LAB — I-STAT ARTERIAL BLOOD GAS, ED
Acid-base deficit: 2 mmol/L (ref 0.0–2.0)
Bicarbonate: 22.4 mmol/L (ref 20.0–28.0)
O2 Saturation: 97 %
PCO2 ART: 34.4 mmHg (ref 32.0–48.0)
PH ART: 7.419 (ref 7.350–7.450)
TCO2: 23 mmol/L (ref 22–32)
pO2, Arterial: 83 mmHg (ref 83.0–108.0)

## 2017-10-30 LAB — COMPREHENSIVE METABOLIC PANEL
ALK PHOS: 71 U/L (ref 38–126)
ALT: 21 U/L (ref 0–44)
ANION GAP: 10 (ref 5–15)
AST: 29 U/L (ref 15–41)
Albumin: 4.1 g/dL (ref 3.5–5.0)
BILIRUBIN TOTAL: 0.8 mg/dL (ref 0.3–1.2)
BUN: 27 mg/dL — ABNORMAL HIGH (ref 6–20)
CALCIUM: 9.4 mg/dL (ref 8.9–10.3)
CO2: 24 mmol/L (ref 22–32)
Chloride: 102 mmol/L (ref 98–111)
Creatinine, Ser: 1.06 mg/dL (ref 0.61–1.24)
GFR calc non Af Amer: >60 mL/min (ref >60)
Glucose, Bld: 82 mg/dL (ref 70–99)
POTASSIUM: 4.2 mmol/L (ref 3.5–5.1)
SODIUM: 136 mmol/L (ref 135–145)
TOTAL PROTEIN: 7.3 g/dL (ref 6.5–8.1)

## 2017-10-30 LAB — GROUP A STREP BY PCR: GROUP A STREP BY PCR: NOT DETECTED

## 2017-10-30 LAB — CK: CK TOTAL: 611 U/L — AB (ref 49–397)

## 2017-10-30 LAB — LIPASE, BLOOD: Lipase: 33 U/L (ref 11–51)

## 2017-10-30 MED ORDER — SODIUM CHLORIDE 0.9 % IV BOLUS
1000.0000 mL | Freq: Once | INTRAVENOUS | Status: AC
  Administered 2017-10-30: 1000 mL via INTRAVENOUS

## 2017-10-30 MED ORDER — ACETAMINOPHEN 325 MG PO TABS
650.0000 mg | ORAL_TABLET | Freq: Once | ORAL | Status: DC
  Filled 2017-10-30: qty 2

## 2017-10-30 MED ORDER — ONDANSETRON 4 MG PO TBDP
4.0000 mg | ORAL_TABLET | Freq: Once | ORAL | Status: AC | PRN
  Administered 2017-10-30: 4 mg via ORAL
  Filled 2017-10-30: qty 1

## 2017-10-30 NOTE — ED Notes (Signed)
Lab to add on CK 

## 2017-10-30 NOTE — ED Notes (Addendum)
Pt left with family member without any notification to nursing staff.

## 2017-10-30 NOTE — ED Notes (Signed)
Pt and family member refusing tylenol stating "who the hell goes to the hospital to get tylenol? That's why everyone says this hospital is the worst". Pt encouraged to try the tylenol and this RN reassured pt that if tylenol does not work that I will again talk with provider about something else for pain. Pt not agreeable to this plan. States "whoever is writing the orders needs to change the damn order".

## 2017-10-30 NOTE — ED Provider Notes (Signed)
[facility] EMERGENCY DEPARTMENT Provider Note   CSN: [id number] Arrival date & time: 10/30/17  4098     History   Chief Complaint Chief Complaint  Patient presents with  . Abdominal Pain  . Emesis  . Chemical Exposure    HPI Nicholas [name] is a 47 y.o. male.  47yo male presents with complaint of nausea, vomiting, diarrhea (loose/watery) as well as body aches, abdominal cramping, sore throat, headache.  Patient states his symptoms started on Sunday after he rode in a dump truck to [place] [place] and states there were a lot of exhaust fumes in the truck.  Patient states he is unable to keep anything down since Sunday when the vomiting started.  He denies fevers, chills, blood in his stools or emesis.  No known sick contacts. No other complaints or concerns.     Past Medical History:  Diagnosis Date  . Chronic back pain   . ETOH abuse   . Opiate addiction [facility])     Patient Active Problem List   Diagnosis Date Noted  . Polysubstance abuse (HCC)   . Alcohol dependence with uncomplicated withdrawal (HCC)   . Chest pain 03/22/2017  . History of substance abuse (HCC) 03/22/2017  . Tobacco abuse 03/22/2017  . Nondisplaced fracture of lateral malleolus of left fibula, initial encounter for closed fracture 02/23/2016  . Abdominal pain 11/08/2015  . Dilated bile duct 11/08/2015  . Nausea & vomiting 11/08/2015    History reviewed. No pertinent surgical history.      Home Medications    Prior to Admission medications   Medication Sig Start Date End Date Taking? Authorizing Provider  aspirin EC 81 MG EC tablet Take 1 tablet (81 mg total) by mouth daily. Patient not taking: Reported on 10/30/2017 03/27/17   Elease Etienne, MD  folic acid (FOLVITE) 1 MG tablet Take 1 tablet (1 mg total) by mouth daily. Patient not taking: Reported on 10/30/2017 03/27/17   Elease Etienne, MD  Multiple Vitamin (MULTIVITAMIN WITH MINERALS) TABS tablet Take 1 tablet by mouth  daily. Patient not taking: Reported on 10/30/2017 03/27/17   Elease Etienne, MD  thiamine 100 MG tablet Take 1 tablet (100 mg total) by mouth daily. Patient not taking: Reported on 10/30/2017 03/27/17   Elease Etienne, MD    Family History Family History  Problem Relation Age of Onset  . Alcoholism Father   . Testicular cancer Father   . Heart Problems Father   . Hernia Father   . Intracerebral hemorrhage Maternal Grandmother     Social History Social History   Tobacco Use  . Smoking status: Current Every Day Smoker    Packs/day: 2.00    Types: Cigarettes  . Smokeless tobacco: Never Used  Substance Use Topics  . Alcohol use: Yes  . Drug use: Yes     Allergies   Patient has no known allergies.   Review of Systems Review of Systems  Constitutional: Negative for chills, diaphoresis and fever.  HENT: Positive for sore throat. Negative for congestion, postnasal drip, rhinorrhea, sinus pressure and sinus pain.   Respiratory: Negative for cough and shortness of breath.   Cardiovascular: Negative for chest pain.  Gastrointestinal: Positive for abdominal pain, diarrhea, nausea and vomiting. Negative for abdominal distention, blood in stool and constipation.  Genitourinary: Positive for decreased urine volume. Negative for difficulty urinating, dysuria, frequency and urgency.  Musculoskeletal: Positive for myalgias. Negative for arthralgias, back pain and joint swelling.  Skin: Negative  for rash and wound.  Allergic/Immunologic: Negative for immunocompromised state.  Neurological: Positive for headaches. Negative for dizziness and weakness.  Hematological: Does not bruise/bleed easily.  Psychiatric/Behavioral: Negative for confusion.  All other systems reviewed and are negative.    Physical Exam Updated Vital Signs BP 101/68   Pulse 68   Temp 97.9 F (36.6 C) (Oral)   Resp 16   Ht 6' (1.829 m)   Wt 79.4 kg   SpO2 100%   BMI 23.73 kg/m   Physical Exam    Constitutional: He is oriented to person, place, and time. He appears well-developed and well-nourished. No distress.  Appears uncomfortable, frequent movement at the side of the bed.  HENT:  Head: Normocephalic and atraumatic.  Eyes: Pupils are equal, round, and reactive to light.  Cardiovascular: Normal rate, regular rhythm and intact distal pulses.  Pulmonary/Chest: Effort normal and breath sounds normal.  Abdominal: Normal appearance and bowel sounds are normal. He exhibits no distension. There is no tenderness.  Neurological: He is alert and oriented to person, place, and time.  Skin: Skin is warm and dry. He is not diaphoretic.  Psychiatric: He has a normal mood and affect. His behavior is normal.  Nursing note and vitals reviewed.    ED Treatments / Results  Labs (all labs ordered are listed, but only abnormal results are displayed) Labs Reviewed  COMPREHENSIVE METABOLIC PANEL - Abnormal; Notable for the following components:      Result Value   BUN 27 (*)    All other components within normal limits  CK - Abnormal; Notable for the following components:   Total CK 611 (*)    All other components within normal limits  GROUP A STREP BY PCR  LIPASE, BLOOD  CBC  I-STAT ARTERIAL BLOOD GAS, ED    EKG None  Radiology No results found.  Procedures Procedures (including critical care time)  Medications Ordered in ED Medications  ondansetron (ZOFRAN-ODT) disintegrating tablet 4 mg (4 mg Oral Given 10/30/17 0958)  sodium chloride 0.9 % bolus 1,000 mL (0 mLs Intravenous Stopped 10/30/17 1401)  sodium chloride 0.9 % bolus 1,000 mL (0 mLs Intravenous Stopped 10/30/17 1459)     Initial Impression / Assessment and Plan / ED Course  I have reviewed the triage vital signs and the nursing notes.  Pertinent labs & imaging results that were available during my care of the patient were reviewed by me and considered in my medical decision making (see chart for details).  Clinical  Course as of Oct 30 2008  Wed Oct 30, 2017  2259 47 year old male presents with complaints of abdominal pain with nausea, vomiting, diarrhea after exposure to exhaust fumes and a truck 3 days ago.  ABGs within normal limits, strep test is negative (complaint of sore throat), CMP with elevated BUN at 27, CK elevated at 611, lipase normal, CBC normal.  Awaiting urinalysis, patient was given fluids. Advised by patient's nurse he had removed his IV and walked out.   [LM]    Clinical Course User Index [LM] Jeannie Fend, PA-C   Final Clinical Impressions(s) / ED Diagnoses   Final diagnoses:  Nausea vomiting and diarrhea  Dehydration  Elevated CK    ED Discharge Orders    None       Alden Hipp 10/30/17 2010    Mancel Bale, MD 11/01/17 [phone]

## 2017-10-30 NOTE — ED Notes (Signed)
Pts family member came out to nurses station stating that the pt is ready to go home. Pt is yelling and cussing in his room stating that the nurse and PA can "shove it(the tylenol) up her ass." RN, Crystal, notified EDP, Vernona Rieger.

## 2017-10-30 NOTE — ED Triage Notes (Signed)
Pt reports 2 days ago he was riding in a buddy's dump trump when he was exposed to fumes. Since then he reports projectile vomiting, abdominal cramping and diarrhea. Pt is alert and is having sporadic movements in the chair at triage.

## 2017-10-30 NOTE — ED Notes (Signed)
Reports he takes methadone

## 2019-02-11 ENCOUNTER — Other Ambulatory Visit: Payer: Self-pay

## 2019-02-11 ENCOUNTER — Encounter (HOSPITAL_COMMUNITY): Payer: Self-pay

## 2019-02-11 ENCOUNTER — Ambulatory Visit (HOSPITAL_COMMUNITY)
Admission: EM | Admit: 2019-02-11 | Discharge: 2019-02-11 | Disposition: A | Discharge status: Home / Self Care | Payer: Medicaid Other | Attending: Family Medicine | Admitting: Family Medicine

## 2019-02-11 DIAGNOSIS — F1110 Opioid abuse, uncomplicated: Secondary | ICD-10-CM

## 2019-02-11 DIAGNOSIS — L0291 Cutaneous abscess, unspecified: Secondary | ICD-10-CM

## 2019-02-11 MED ORDER — LIDOCAINE HCL (PF) 2 % IJ SOLN
INTRAMUSCULAR | Status: AC
  Filled 2019-02-11: qty 5

## 2019-02-11 MED ORDER — CEFTRIAXONE SODIUM 1 G IJ SOLR
1.0000 g | Freq: Once | INTRAMUSCULAR | Status: AC
  Administered 2019-02-11: 1 g via INTRAMUSCULAR

## 2019-02-11 MED ORDER — DOXYCYCLINE HYCLATE 100 MG PO CAPS: 100.0000 mg | ORAL_CAPSULE | Freq: Two times a day (BID) | ORAL | 0 refills | Status: DC

## 2019-02-11 MED ORDER — CEFTRIAXONE SODIUM 1 G IJ SOLR
INTRAMUSCULAR | Status: AC
  Filled 2019-02-11: qty 10

## 2019-02-11 MED ORDER — HYDROCODONE-ACETAMINOPHEN 5-325 MG PO TABS
1.0000 | ORAL_TABLET | Freq: Once | ORAL | Status: AC
  Administered 2019-02-11: 1 via ORAL

## 2019-02-11 MED ORDER — LIDOCAINE HCL (PF) 1 % IJ SOLN
INTRAMUSCULAR | Status: AC
  Filled 2019-02-11: qty 4

## 2019-02-11 MED ORDER — HYDROCODONE-ACETAMINOPHEN 5-325 MG PO TABS
ORAL_TABLET | ORAL | Status: AC
  Filled 2019-02-11: qty 1

## 2019-02-11 NOTE — ED Triage Notes (Signed)
Pt states he has abscess on his upper forearm.  Pt states he has had it for 4 days.

## 2019-02-11 NOTE — ED Provider Notes (Signed)
[place]   [id number] 02/11/19 Arrival Time: 1601  ASSESSMENT & PLAN:  1. Abscess   2. Heroin abuse ([place])     Incision and Drainage Procedure Note  Anesthesia: 2% plain lidocaine  Procedure Details  The procedure, risks and complications have been discussed in detail (including, but not limited to pain and bleeding) with the patient.  The skin induration was prepped and draped in the usual fashion. After adequate local anesthesia, I&D with a #11 blade was performed on the right antecubital fossa with copious, purulent drainage.  EBL: minimal Drains: none Packing: none (could not tolerate) Complications: pain. Unable to explore cavity as I would have liked to secondary to reported pain.  Meds ordered this encounter  Medications  . cefTRIAXone (ROCEPHIN) injection 1 g  . HYDROcodone-acetaminophen (NORCO/VICODIN) 5-325 MG per tablet 1 tablet  . doxycycline (VIBRAMYCIN) 100 MG capsule    Sig: Take 1 capsule (100 mg total) by mouth 2 (two) times daily.    Dispense:  20 capsule    Refill:  0    Wound care instructions discussed and given in written format. To return in 48 hours for wound check, sooner if needed. See AVS for I&D care instructions.  Finish all antibiotics. OTC analgesics as needed.  Reviewed expectations re: course of current medical issues. Questions answered. Outlined signs and symptoms indicating need for more acute intervention. Patient verbalized understanding. After Visit Summary given.   SUBJECTIVE:  Nicholas [name] is a 49 y.o. male who presents with a possible infection of his R antecubital fossa. Onset gradual, approximately 4 days ago without active drainage and without active bleeding. Symptoms have gradually worsened since beginning. Fever: absent. OTC/home treatment: none. Significant pain. Admits to IV heroin use; last use just before current symptoms. No extremity sensation changes or weakness.  ROS: As per  HPI.  OBJECTIVE:  Vitals:   02/11/19 1422 02/11/19 1425  BP:  133/81  Pulse:  64  Resp:  18  Temp:  98.6 F (37 C)  TempSrc:  Oral  SpO2:  100%  Weight: 90.7 kg      General appearance: alert; no distress RUE: approx 3-4 cm induration of his R antecubital fossa; exquisitely tender to touch; no active drainage or bleeding; FROM at elbow with reported pain; normal distal sensation Psychological: alert and cooperative; normal mood and affect  No Known Allergies  Past Medical History:  Diagnosis Date  . Chronic back pain   . ETOH abuse   . Opiate addiction [facility])    Social History   Socioeconomic History  . Marital status: Single    Spouse name: Not on file  . Number of children: Not on file  . Years of education: Not on file  . Highest education level: Not on file  Occupational History  . Not on file  Tobacco Use  . Smoking status: Current Every Day Smoker    Packs/day: 2.00    Types: Cigarettes  . Smokeless tobacco: Never Used  Substance and Sexual Activity  . Alcohol use: Yes  . Drug use: Yes  . Sexual activity: Not on file  Other Topics Concern  . Not on file  Social History Narrative  . Not on file   Social Determinants of Health   Financial Resource Strain:   . Difficulty of Paying Living Expenses: Not on file  Food Insecurity:   . Worried About Charity fundraiser in the Last Year: Not on file  . Ran Out of Food in  the Last Year: Not on file  Transportation Needs:   . Lack of Transportation (Medical): Not on file  . Lack of Transportation (Non-Medical): Not on file  Physical Activity:   . Days of Exercise per Week: Not on file  . Minutes of Exercise per Session: Not on file  Stress:   . Feeling of Stress : Not on file  Social Connections:   . Frequency of Communication with Friends and Family: Not on file  . Frequency of Social Gatherings with Friends and Family: Not on file  . Attends Religious Services: Not on file  . Active Member of Clubs  or Organizations: Not on file  . Attends Banker Meetings: Not on file  . Marital Status: Not on file   Family History  Problem Relation Age of Onset  . Alcoholism Father   . Testicular cancer Father   . Heart Problems Father   . Hernia Father   . Intracerebral hemorrhage Maternal Grandmother    History reviewed. No pertinent surgical history.         Mardella Layman, MD 02/11/19 1511

## 2019-02-11 NOTE — Discharge Instructions (Signed)
If not allergic, you may use over the counter ibuprofen or acetaminophen as needed for pain. 

## 2020-05-01 ENCOUNTER — Other Ambulatory Visit: Payer: Self-pay

## 2020-05-01 ENCOUNTER — Emergency Department (HOSPITAL_COMMUNITY): Payer: Medicaid Other

## 2020-05-01 ENCOUNTER — Encounter (HOSPITAL_COMMUNITY): Payer: Self-pay

## 2020-05-01 ENCOUNTER — Emergency Department (HOSPITAL_COMMUNITY)
Admission: EM | Admit: 2020-05-01 | Discharge: 2020-05-01 | Disposition: A | Discharge status: Home / Self Care | Payer: Medicaid Other | Attending: Emergency Medicine | Admitting: Emergency Medicine

## 2020-05-01 DIAGNOSIS — K2970 Gastritis, unspecified, without bleeding: Secondary | ICD-10-CM

## 2020-05-01 DIAGNOSIS — R197 Diarrhea, unspecified: Secondary | ICD-10-CM | POA: Diagnosis not present

## 2020-05-01 DIAGNOSIS — F17210 Nicotine dependence, cigarettes, uncomplicated: Secondary | ICD-10-CM | POA: Diagnosis not present

## 2020-05-01 DIAGNOSIS — R1013 Epigastric pain: Secondary | ICD-10-CM

## 2020-05-01 DIAGNOSIS — R1010 Upper abdominal pain, unspecified: Secondary | ICD-10-CM | POA: Diagnosis present

## 2020-05-01 LAB — CBC WITH DIFFERENTIAL/PLATELET
Abs Immature Granulocytes: 0.04 10*3/uL (ref 0.00–0.07)
Basophils Absolute: 0.1 10*3/uL (ref 0.0–0.1)
Basophils Relative: 1 %
Eosinophils Absolute: 0.1 10*3/uL (ref 0.0–0.5)
Eosinophils Relative: 1 %
HCT: 43.4 % (ref 39.0–52.0)
Hemoglobin: 14 g/dL (ref 13.0–17.0)
Immature Granulocytes: 0 %
Lymphocytes Relative: 18 %
Lymphs Abs: 1.9 10*3/uL (ref 0.7–4.0)
MCH: 30.2 pg (ref 26.0–34.0)
MCHC: 32.3 g/dL (ref 30.0–36.0)
MCV: 93.5 fL (ref 80.0–100.0)
Monocytes Absolute: 1 10*3/uL (ref 0.1–1.0)
Monocytes Relative: 9 %
Neutro Abs: 7.6 10*3/uL (ref 1.7–7.7)
Neutrophils Relative %: 71 %
Platelets: 151 10*3/uL (ref 150–400)
RBC: 4.64 MIL/uL (ref 4.22–5.81)
RDW: 13.2 % (ref 11.5–15.5)
WBC: 10.7 10*3/uL — ABNORMAL HIGH (ref 4.0–10.5)
nRBC: 0 % (ref 0.0–0.2)

## 2020-05-01 LAB — COMPREHENSIVE METABOLIC PANEL
ALT: 20 U/L (ref 0–44)
AST: 23 U/L (ref 15–41)
Albumin: 3.6 g/dL (ref 3.5–5.0)
Alkaline Phosphatase: 61 U/L (ref 38–126)
Anion gap: 12 (ref 5–15)
BUN: 19 mg/dL (ref 6–20)
CO2: 16 mmol/L — ABNORMAL LOW (ref 22–32)
Calcium: 8.2 mg/dL — ABNORMAL LOW (ref 8.9–10.3)
Chloride: 112 mmol/L — ABNORMAL HIGH (ref 98–111)
Creatinine, Ser: 0.78 mg/dL (ref 0.61–1.24)
GFR, Estimated: >60 mL/min (ref >60)
Glucose, Bld: 89 mg/dL (ref 70–99)
Potassium: 3.8 mmol/L (ref 3.5–5.1)
Sodium: 140 mmol/L (ref 135–145)
Total Bilirubin: 0.6 mg/dL (ref 0.3–1.2)
Total Protein: 6.3 g/dL — ABNORMAL LOW (ref 6.5–8.1)

## 2020-05-01 LAB — BLOOD GAS, VENOUS
Acid-base deficit: 2.3 mmol/L — ABNORMAL HIGH (ref 0.0–2.0)
Bicarbonate: 20.8 mmol/L (ref 20.0–28.0)
FIO2: 21
O2 Saturation: 85.8 %
Patient temperature: 98.6
pCO2, Ven: 32.6 mmHg — ABNORMAL LOW (ref 44.0–60.0)
pH, Ven: 7.421 (ref 7.250–7.430)
pO2, Ven: 50.4 mmHg — ABNORMAL HIGH (ref 32.0–45.0)

## 2020-05-01 LAB — URINALYSIS, ROUTINE W REFLEX MICROSCOPIC
Bilirubin Urine: NEGATIVE
Glucose, UA: NEGATIVE mg/dL
Hgb urine dipstick: NEGATIVE
Ketones, ur: 5 mg/dL — AB
Leukocytes,Ua: NEGATIVE
Nitrite: NEGATIVE
Protein, ur: NEGATIVE mg/dL
Specific Gravity, Urine: 1.019 (ref 1.005–1.030)
pH: 8 (ref 5.0–8.0)

## 2020-05-01 LAB — LIPASE, BLOOD: Lipase: 88 U/L — ABNORMAL HIGH (ref 11–51)

## 2020-05-01 LAB — ETHANOL: Alcohol, Ethyl (B): <10 mg/dL (ref <10)

## 2020-05-01 LAB — BETA-HYDROXYBUTYRIC ACID: Beta-Hydroxybutyric Acid: 0.31 mmol/L — ABNORMAL HIGH (ref 0.05–0.27)

## 2020-05-01 LAB — LACTIC ACID, PLASMA: Lactic Acid, Venous: 1.5 mmol/L (ref 0.5–1.9)

## 2020-05-01 MED ORDER — SODIUM CHLORIDE 0.9 % IV BOLUS
1000.0000 mL | Freq: Once | INTRAVENOUS | Status: AC
  Administered 2020-05-01: 1000 mL via INTRAVENOUS

## 2020-05-01 MED ORDER — ALUM & MAG HYDROXIDE-SIMETH 200-200-20 MG/5ML PO SUSP
30.0000 mL | Freq: Once | ORAL | Status: AC
  Administered 2020-05-01: 30 mL via ORAL
  Filled 2020-05-01: qty 30

## 2020-05-01 MED ORDER — IOHEXOL 300 MG/ML  SOLN
100.0000 mL | Freq: Once | INTRAMUSCULAR | Status: AC | PRN
  Administered 2020-05-01: 100 mL via INTRAVENOUS

## 2020-05-01 MED ORDER — LIDOCAINE VISCOUS HCL 2 % MT SOLN
15.0000 mL | Freq: Once | OROMUCOSAL | Status: AC
  Administered 2020-05-01: 15 mL via ORAL
  Filled 2020-05-01: qty 15

## 2020-05-01 MED ORDER — METOCLOPRAMIDE HCL 5 MG/ML IJ SOLN
10.0000 mg | Freq: Once | INTRAMUSCULAR | Status: AC
  Administered 2020-05-01: 10 mg via INTRAVENOUS
  Filled 2020-05-01: qty 2

## 2020-05-01 MED ORDER — ACETAMINOPHEN 325 MG PO TABS
650.0000 mg | ORAL_TABLET | Freq: Once | ORAL | Status: AC
  Administered 2020-05-01: 650 mg via ORAL
  Filled 2020-05-01: qty 2

## 2020-05-01 MED ORDER — DICYCLOMINE HCL 10 MG PO CAPS
10.0000 mg | ORAL_CAPSULE | Freq: Once | ORAL | Status: AC
  Administered 2020-05-01: 10 mg via ORAL
  Filled 2020-05-01: qty 1

## 2020-05-01 MED ORDER — KETOROLAC TROMETHAMINE 15 MG/ML IJ SOLN
15.0000 mg | Freq: Once | INTRAMUSCULAR | Status: AC
  Administered 2020-05-01: 15 mg via INTRAVENOUS
  Filled 2020-05-01: qty 1

## 2020-05-01 MED ORDER — DROPERIDOL 2.5 MG/ML IJ SOLN
1.2500 mg | Freq: Once | INTRAMUSCULAR | Status: AC
  Administered 2020-05-01: 1.25 mg via INTRAVENOUS
  Filled 2020-05-01: qty 2

## 2020-05-01 MED ORDER — LORAZEPAM 2 MG/ML IJ SOLN
1.0000 mg | Freq: Once | INTRAMUSCULAR | Status: AC
  Administered 2020-05-01: 1 mg via INTRAVENOUS
  Filled 2020-05-01: qty 1

## 2020-05-01 NOTE — Discharge Instructions (Signed)
Recommend Tylenol or Motrin for your pain.  Please follow-up with your primary care doctor.  If you have uncontrolled pain, vomiting, fever, or other new concerning symptoms return to ER for reassessment.

## 2020-05-01 NOTE — ED Notes (Signed)
Pt arrived from EMS soiled. Pt had large bm. Pt was uncooperative when attempting to clean him. Pt did not follow verbal orders well when instructed to keep hands away from soiledness, pt putting staff at risk of contamination.

## 2020-05-01 NOTE — ED Provider Notes (Signed)
[facility]-EMERGENCY DEPT Provider Note   CSN: [id number] Arrival date & time: 05/01/20  1748     History Chief Complaint  Patient presents with  . Abdominal Pain    Nicholas [name] is a 50 y.o. male.  Presents to ER with concern for abdominal pain.  Patient reports that he was having some severe abdominal pain.  Worse in his upper abdomen, middle of abdomen.  Attempted to self medicate with Subutex.  No improvement in symptoms.  States that he had multiple episodes of brown diarrheal stools.  No blood in stool.  Also had a couple episodes of nonbloody nonbilious emesis.  No fevers.  Per review of chart has history of alcohol abuse and opiate abuse.  History of abdominal pain.  HPI     Past Medical History:  Diagnosis Date  . Chronic back pain   . ETOH abuse   . Opiate addiction [facility])     Patient Active Problem List   Diagnosis Date Noted  . Polysubstance abuse (HCC)   . Alcohol dependence with uncomplicated withdrawal (HCC)   . Chest pain 03/22/2017  . History of substance abuse (HCC) 03/22/2017  . Tobacco abuse 03/22/2017  . Nondisplaced fracture of lateral malleolus of left fibula, initial encounter for closed fracture 02/23/2016  . Abdominal pain 11/08/2015  . Dilated bile duct 11/08/2015  . Nausea & vomiting 11/08/2015    History reviewed. No pertinent surgical history.     Family History  Problem Relation Age of Onset  . Alcoholism Father   . Testicular cancer Father   . Heart Problems Father   . Hernia Father   . Intracerebral hemorrhage Maternal Grandmother     Social History   Tobacco Use  . Smoking status: Current Every Day Smoker    Packs/day: 2.00    Types: Cigarettes  . Smokeless tobacco: Never Used  Substance Use Topics  . Alcohol use: Yes  . Drug use: Yes    Home Medications Prior to Admission medications   Medication Sig Start Date End Date Taking? Authorizing Provider  aspirin EC 81 MG EC tablet Take 1 tablet (81  mg total) by mouth daily. Patient not taking: Reported on 10/30/2017 03/27/17   Elease Etienne, MD  doxycycline (VIBRAMYCIN) 100 MG capsule Take 1 capsule (100 mg total) by mouth 2 (two) times daily. 02/11/19   Mardella Layman, MD  folic acid (FOLVITE) 1 MG tablet Take 1 tablet (1 mg total) by mouth daily. Patient not taking: Reported on 10/30/2017 03/27/17   Elease Etienne, MD  Multiple Vitamin (MULTIVITAMIN WITH MINERALS) TABS tablet Take 1 tablet by mouth daily. Patient not taking: Reported on 10/30/2017 03/27/17   Elease Etienne, MD  thiamine 100 MG tablet Take 1 tablet (100 mg total) by mouth daily. Patient not taking: Reported on 10/30/2017 03/27/17   Elease Etienne, MD    Allergies    Patient has no known allergies.  Review of Systems   Review of Systems  Constitutional: Negative for chills and fever.  HENT: Negative for ear pain and sore throat.   Eyes: Negative for pain and visual disturbance.  Respiratory: Negative for cough and shortness of breath.   Cardiovascular: Negative for chest pain and palpitations.  Gastrointestinal: Positive for abdominal pain and nausea. Negative for vomiting.  Genitourinary: Negative for dysuria and hematuria.  Musculoskeletal: Negative for arthralgias and back pain.  Skin: Negative for color change and rash.  Neurological: Negative for seizures and syncope.  All  other systems reviewed and are negative.   Physical Exam Updated Vital Signs BP (!) 136/97   Pulse 70   Temp 98.9 F (37.2 C) (Oral)   Resp 14   SpO2 98%   Physical Exam Vitals and nursing note reviewed.  Constitutional:      Appearance: He is well-developed.  HENT:     Head: Normocephalic and atraumatic.  Eyes:     Conjunctiva/sclera: Conjunctivae normal.  Cardiovascular:     Rate and Rhythm: Normal rate and regular rhythm.     Heart sounds: No murmur heard.   Pulmonary:     Effort: Pulmonary effort is normal. No respiratory distress.     Breath sounds: Normal  breath sounds.  Abdominal:     General: Abdomen is flat. Bowel sounds are normal. There is no distension.     Palpations: Abdomen is soft.     Comments: Some tenderness in center of abdomen  Musculoskeletal:     Cervical back: Neck supple.  Skin:    General: Skin is warm and dry.  Neurological:     General: No focal deficit present.     Mental Status: He is alert.  Psychiatric:        Mood and Affect: Mood normal.     ED Results / Procedures / Treatments   Labs (all labs ordered are listed, but only abnormal results are displayed) Labs Reviewed  CBC WITH DIFFERENTIAL/PLATELET - Abnormal; Notable for the following components:      Result Value   WBC 10.7 (*)    All other components within normal limits  COMPREHENSIVE METABOLIC PANEL - Abnormal; Notable for the following components:   Chloride 112 (*)    CO2 16 (*)    Calcium 8.2 (*)    Total Protein 6.3 (*)    All other components within normal limits  LIPASE, BLOOD - Abnormal; Notable for the following components:   Lipase 88 (*)    All other components within normal limits  BLOOD GAS, VENOUS - Abnormal; Notable for the following components:   pCO2, Ven 32.6 (*)    pO2, Ven 50.4 (*)    Acid-base deficit 2.3 (*)    All other components within normal limits  BETA-HYDROXYBUTYRIC ACID - Abnormal; Notable for the following components:   Beta-Hydroxybutyric Acid 0.31 (*)    All other components within normal limits  URINALYSIS, ROUTINE W REFLEX MICROSCOPIC - Abnormal; Notable for the following components:   Ketones, ur 5 (*)    All other components within normal limits  ETHANOL  LACTIC ACID, PLASMA    EKG EKG Interpretation  Date/Time:  Sunday May 01 2020 18:10:35 EDT Ventricular Rate:  74 PR Interval:  148 QRS Duration: 119 QT Interval:  425 QTC Calculation: 472 R Axis:   70 Text Interpretation: Sinus rhythm Incomplete right bundle branch block ST elev, probable normal early repol pattern Confirmed by Marianna Fuss ([id number]) on 05/01/2020 8:33:36 PM   Radiology CT ABDOMEN PELVIS W CONTRAST  Result Date: 05/01/2020 CLINICAL DATA:  Acute abdominal pain with vomiting and diarrhea. EXAM: CT ABDOMEN AND PELVIS WITH CONTRAST TECHNIQUE: Multidetector CT imaging of the abdomen and pelvis was performed using the standard protocol following bolus administration of intravenous contrast. CONTRAST:  OMNIPAQUE IOHEXOL 300 MG/ML  SOLN COMPARISON:  11/08/2015 FINDINGS: Lower chest: The lung bases are clear. Hepatobiliary: No focal liver abnormality is seen. No gallstones, gallbladder wall thickening, or biliary dilatation. Pancreas: Unremarkable. No pancreatic ductal dilatation or surrounding inflammatory changes. Spleen:  Normal in size without focal abnormality. Adrenals/Urinary Tract: Adrenal glands are unremarkable. Kidneys are normal, without renal calculi, focal lesion, or hydronephrosis. Bladder is unremarkable. Stomach/Bowel: Stomach, small bowel, and colon are not abnormally distended. No wall thickening or inflammatory changes are appreciated. Appendix is not identified. Vascular/Lymphatic: Scattered retroperitoneal and celiac axis lymph nodes are not pathologically enlarged. Calcification of the aorta. No aneurysm. Reproductive: Prostate is unremarkable. Other: No abdominal wall hernia or abnormality. No abdominopelvic ascites. Musculoskeletal: Degenerative changes in the spine. No destructive bone lesions. IMPRESSION: 1. No acute process demonstrated in the abdomen or pelvis. No evidence of bowel obstruction or inflammation. 2. Aortic atherosclerosis. Aortic Atherosclerosis (ICD10-I70.0). Electronically Signed   By: Burman Nieves M.D.   On: 05/01/2020 20:09    Procedures Procedures   Medications Ordered in ED Medications  LORazepam (ATIVAN) injection 1 mg (1 mg Intravenous Given 05/01/20 1840)  sodium chloride 0.9 % bolus 1,000 mL (0 mLs Intravenous Stopped 05/01/20 2022)  iohexol (OMNIPAQUE) 300 MG/ML  solution 100 mL (100 mLs Intravenous Contrast Given 05/01/20 1949)  metoCLOPramide (REGLAN) injection 10 mg (10 mg Intravenous Given 05/01/20 2021)  ketorolac (TORADOL) 15 MG/ML injection 15 mg (15 mg Intravenous Given 05/01/20 2156)  acetaminophen (TYLENOL) tablet 650 mg (650 mg Oral Given 05/01/20 2156)  droperidol (INAPSINE) 2.5 MG/ML injection 1.25 mg (1.25 mg Intravenous Given 05/01/20 2156)  dicyclomine (BENTYL) capsule 10 mg (10 mg Oral Given 05/01/20 2300)  alum & mag hydroxide-simeth (MAALOX/MYLANTA) 200-200-20 MG/5ML suspension 30 mL (30 mLs Oral Given 05/01/20 2300)    And  lidocaine (XYLOCAINE) 2 % viscous mouth solution 15 mL (15 mLs Oral Given 05/01/20 2300)    ED Course  I have reviewed the triage vital signs and the nursing notes.  Pertinent labs & imaging results that were available during my care of the patient were reviewed by me and considered in my medical decision making (see chart for details).  Clinical Course as of 05/01/20 2315  Sun May 01, 2020  2245 Rechecked, symptoms have improved. He is tolerating PO without difficulty, will dc home.  [RD]    Clinical Course User Index [RD] Milagros Loll, MD   MDM Rules/Calculators/A&P                          50 year old male presents to ER with concern for abdominal pain.  On exam patient initially appeared uncomfortable but was not in distress.  His basic labs were stable except for noted mildly low bicarbonate level.  No anion gap.  Check VBG, lactic acid, beta hydroxybutyrate.  These were all grossly within normal limits.  Beta hydroxybutyric acid was on upper limit of normal.  Suspect related to dehydration but not consistent with alcoholic ketoacidosis.  On reassessment after fluids and pain and antiemetics, patient symptoms were greatly improved.  He appeared well, was tolerating p.o. without any difficulty.  Believe he is appropriate for outpatient management.  Recommend recheck closely with his primary doctor.   Discharge.   After the discussed management above, the patient was determined to be safe for discharge.  The patient was in agreement with this plan and all questions regarding their care were answered.  ED return precautions were discussed and the patient will return to the ED with any significant worsening of condition.  Final Clinical Impression(s) / ED Diagnoses Final diagnoses:  Epigastric pain  Gastritis without bleeding, unspecified chronicity, unspecified gastritis type    Rx / DC Orders ED Discharge  Orders    None       Milagros Lollykstra, Filippo Puls S, MD 05/01/20 2320

## 2020-05-01 NOTE — ED Triage Notes (Signed)
Abdominal pain nausea vomiting, self medicated with subutex; multiple episodes of diarrhea afterwards.

## 2021-08-14 ENCOUNTER — Encounter (HOSPITAL_COMMUNITY): Payer: Self-pay

## 2021-08-14 ENCOUNTER — Other Ambulatory Visit (HOSPITAL_COMMUNITY): Payer: Self-pay

## 2021-08-14 ENCOUNTER — Emergency Department (HOSPITAL_COMMUNITY)
Admission: EM | Admit: 2021-08-14 | Discharge: 2021-08-14 | Disposition: A | Discharge status: Home / Self Care | Payer: Medicaid Other | Attending: Emergency Medicine | Admitting: Emergency Medicine

## 2021-08-14 ENCOUNTER — Other Ambulatory Visit: Payer: Self-pay

## 2021-08-14 ENCOUNTER — Emergency Department (HOSPITAL_COMMUNITY): Payer: Medicaid Other

## 2021-08-14 DIAGNOSIS — R824 Acetonuria: Secondary | ICD-10-CM | POA: Insufficient documentation

## 2021-08-14 DIAGNOSIS — R197 Diarrhea, unspecified: Secondary | ICD-10-CM | POA: Insufficient documentation

## 2021-08-14 DIAGNOSIS — R1084 Generalized abdominal pain: Secondary | ICD-10-CM | POA: Insufficient documentation

## 2021-08-14 DIAGNOSIS — Z7982 Long term (current) use of aspirin: Secondary | ICD-10-CM | POA: Insufficient documentation

## 2021-08-14 DIAGNOSIS — F1910 Other psychoactive substance abuse, uncomplicated: Secondary | ICD-10-CM

## 2021-08-14 DIAGNOSIS — D72829 Elevated white blood cell count, unspecified: Secondary | ICD-10-CM | POA: Insufficient documentation

## 2021-08-14 DIAGNOSIS — R112 Nausea with vomiting, unspecified: Secondary | ICD-10-CM | POA: Diagnosis present

## 2021-08-14 DIAGNOSIS — F1110 Opioid abuse, uncomplicated: Secondary | ICD-10-CM | POA: Diagnosis not present

## 2021-08-14 LAB — COMPREHENSIVE METABOLIC PANEL
ALT: 18 U/L (ref 0–44)
AST: 20 U/L (ref 15–41)
Albumin: 4.3 g/dL (ref 3.5–5.0)
Alkaline Phosphatase: 70 U/L (ref 38–126)
Anion gap: 11 (ref 5–15)
BUN: 21 mg/dL — ABNORMAL HIGH (ref 6–20)
CO2: 27 mmol/L (ref 22–32)
Calcium: 9.6 mg/dL (ref 8.9–10.3)
Chloride: 103 mmol/L (ref 98–111)
Creatinine, Ser: 0.95 mg/dL (ref 0.61–1.24)
GFR, Estimated: >60 mL/min (ref >60)
Glucose, Bld: 106 mg/dL — ABNORMAL HIGH (ref 70–99)
Potassium: 3.9 mmol/L (ref 3.5–5.1)
Sodium: 141 mmol/L (ref 135–145)
Total Bilirubin: 0.7 mg/dL (ref 0.3–1.2)
Total Protein: 7.8 g/dL (ref 6.5–8.1)

## 2021-08-14 LAB — URINALYSIS, ROUTINE W REFLEX MICROSCOPIC
Bilirubin Urine: NEGATIVE
Glucose, UA: NEGATIVE mg/dL
Hgb urine dipstick: NEGATIVE
Ketones, ur: 20 mg/dL — AB
Leukocytes,Ua: NEGATIVE
Nitrite: NEGATIVE
Protein, ur: NEGATIVE mg/dL
Specific Gravity, Urine: 1.021 (ref 1.005–1.030)
pH: 7 (ref 5.0–8.0)

## 2021-08-14 LAB — CBC WITH DIFFERENTIAL/PLATELET
Abs Immature Granulocytes: 0.06 10*3/uL (ref 0.00–0.07)
Basophils Absolute: 0 10*3/uL (ref 0.0–0.1)
Basophils Relative: 0 %
Eosinophils Absolute: 0 10*3/uL (ref 0.0–0.5)
Eosinophils Relative: 0 %
HCT: 41.9 % (ref 39.0–52.0)
Hemoglobin: 14.3 g/dL (ref 13.0–17.0)
Immature Granulocytes: 0 %
Lymphocytes Relative: 20 %
Lymphs Abs: 3.1 10*3/uL (ref 0.7–4.0)
MCH: 29.2 pg (ref 26.0–34.0)
MCHC: 34.1 g/dL (ref 30.0–36.0)
MCV: 85.7 fL (ref 80.0–100.0)
Monocytes Absolute: 1.6 10*3/uL — ABNORMAL HIGH (ref 0.1–1.0)
Monocytes Relative: 11 %
Neutro Abs: 10.4 10*3/uL — ABNORMAL HIGH (ref 1.7–7.7)
Neutrophils Relative %: 69 %
Platelets: 332 10*3/uL (ref 150–400)
RBC: 4.89 MIL/uL (ref 4.22–5.81)
RDW: 14.1 % (ref 11.5–15.5)
WBC: 15.2 10*3/uL — ABNORMAL HIGH (ref 4.0–10.5)
nRBC: 0 % (ref 0.0–0.2)

## 2021-08-14 LAB — LIPASE, BLOOD: Lipase: 28 U/L (ref 11–51)

## 2021-08-14 MED ORDER — SODIUM CHLORIDE 0.9 % IV SOLN: 25.0000 mg | Freq: Four times a day (QID) | INTRAVENOUS | Status: DC | PRN

## 2021-08-14 MED ORDER — IOHEXOL 300 MG/ML  SOLN
100.0000 mL | Freq: Once | INTRAMUSCULAR | Status: AC | PRN
  Administered 2021-08-14: 100 mL via INTRAVENOUS

## 2021-08-14 MED ORDER — LORAZEPAM 2 MG/ML IJ SOLN
1.0000 mg | Freq: Once | INTRAMUSCULAR | Status: AC
  Administered 2021-08-14: 1 mg via INTRAVENOUS
  Filled 2021-08-14: qty 1

## 2021-08-14 MED ORDER — DROPERIDOL 2.5 MG/ML IJ SOLN
1.2500 mg | Freq: Once | INTRAMUSCULAR | Status: AC
  Administered 2021-08-14: 1.25 mg via INTRAVENOUS
  Filled 2021-08-14: qty 2

## 2021-08-14 MED ORDER — SODIUM CHLORIDE 0.9 % IV BOLUS
1000.0000 mL | Freq: Once | INTRAVENOUS | Status: AC
  Administered 2021-08-14: 1000 mL via INTRAVENOUS

## 2021-08-14 MED ORDER — ONDANSETRON 4 MG PO TBDP
4.0000 mg | ORAL_TABLET | Freq: Three times a day (TID) | ORAL | 0 refills | Status: DC | PRN
  Filled 2021-08-14: qty 20, 7d supply, fill #0

## 2021-08-14 NOTE — ED Notes (Signed)
Pt has a urinal at bedside 

## 2021-08-14 NOTE — Discharge Instructions (Addendum)
As we discussed, your work-up in the ER today was reassuring for acute abnormalities.  CT imaging and laboratory evaluation did not reveal any emergent concerns today.  I suspect her symptoms are from heroin withdrawal.  I have given you a prescription for medication to help with your symptoms.  Return if development of any new or worsening symptoms.

## 2021-08-14 NOTE — ED Triage Notes (Signed)
Patient c/o abdominal pain and vomiting.  Patient states he is withdrawing from heroin. Patient last used yesterday morning.

## 2021-08-14 NOTE — ED Provider Notes (Signed)
[facility]-EMERGENCY DEPT Provider Note   CSN: [id number] Arrival date & time: 08/14/21  3825     History  Chief Complaint  Patient presents with   Abdominal Pain   Emesis   Withdrawal    Nicholas [name] is a 51 y.o. male.  Patient with history of polysubstance abuse presents today with complaints of abdominal pain, nausea, vomiting, and diarrhea. He states that yesterday morning was his last heroin use and he didn't have enough to get high off of at that time. States that after that he decided to stop completely and has not used since then. States that soon after his last use he developed nausea, vomiting, diarrhea, and abdominal pain that has persisted since then.   The history is provided by the patient. No language interpreter was used.  Abdominal Pain Associated symptoms: diarrhea, nausea and vomiting   Emesis Associated symptoms: abdominal pain and diarrhea        Home Medications Prior to Admission medications   Medication Sig Start Date End Date Taking? Authorizing Provider  aspirin EC 81 MG EC tablet Take 1 tablet (81 mg total) by mouth daily. Patient not taking: Reported on 10/30/2017 03/27/17   Elease Etienne, MD  doxycycline (VIBRAMYCIN) 100 MG capsule Take 1 capsule (100 mg total) by mouth 2 (two) times daily. 02/11/19   Mardella Layman, MD  folic acid (FOLVITE) 1 MG tablet Take 1 tablet (1 mg total) by mouth daily. Patient not taking: Reported on 10/30/2017 03/27/17   Elease Etienne, MD  Multiple Vitamin (MULTIVITAMIN WITH MINERALS) TABS tablet Take 1 tablet by mouth daily. Patient not taking: Reported on 10/30/2017 03/27/17   Elease Etienne, MD  thiamine 100 MG tablet Take 1 tablet (100 mg total) by mouth daily. Patient not taking: Reported on 10/30/2017 03/27/17   Elease Etienne, MD      Allergies    Patient has no known allergies.    Review of Systems   Review of Systems  Gastrointestinal:  Positive for abdominal pain, diarrhea,  nausea and vomiting.  All other systems reviewed and are negative.   Physical Exam Updated Vital Signs BP (!) 120/105 (BP Location: Left Arm)   Pulse 74   Temp 98.2 F (36.8 C) (Oral)   Resp 18   Ht 6' (1.829 m)   Wt 72.6 kg   SpO2 100%   BMI 21.70 kg/m  Physical Exam Vitals and nursing note reviewed.  Constitutional:      General: He is not in acute distress.    Appearance: Normal appearance. He is normal weight. He is not ill-appearing, toxic-appearing or diaphoretic.  HENT:     Head: Normocephalic and atraumatic.  Cardiovascular:     Rate and Rhythm: Normal rate.  Pulmonary:     Effort: Pulmonary effort is normal. No respiratory distress.  Abdominal:     Tenderness: There is generalized abdominal tenderness.  Musculoskeletal:        General: Normal range of motion.     Cervical back: Normal range of motion.  Skin:    General: Skin is warm and dry.  Neurological:     General: No focal deficit present.     Mental Status: He is alert.  Psychiatric:        Mood and Affect: Mood normal.        Behavior: Behavior normal.     ED Results / Procedures / Treatments   Labs (all labs ordered are listed, but only abnormal  results are displayed) Labs Reviewed  COMPREHENSIVE METABOLIC PANEL - Abnormal; Notable for the following components:      Result Value   Glucose, Bld 106 (*)    BUN 21 (*)    All other components within normal limits  CBC WITH DIFFERENTIAL/PLATELET - Abnormal; Notable for the following components:   WBC 15.2 (*)    Neutro Abs 10.4 (*)    Monocytes Absolute 1.6 (*)    All other components within normal limits  URINALYSIS, ROUTINE W REFLEX MICROSCOPIC - Abnormal; Notable for the following components:   Ketones, ur 20 (*)    Bacteria, UA RARE (*)    All other components within normal limits  LIPASE, BLOOD    EKG None  Radiology No results found.  Procedures Procedures    Medications Ordered in ED Medications  promethazine (PHENERGAN)  25 mg in sodium chloride 0.9 % 50 mL IVPB (has no administration in time range)  sodium chloride 0.9 % bolus 1,000 mL (0 mLs Intravenous Stopped 08/14/21 0915)  LORazepam (ATIVAN) injection 1 mg (1 mg Intravenous Given 08/14/21 0810)    ED Course/ Medical Decision Making/ A&P                           Medical Decision Making Amount and/or Complexity of Data Reviewed Labs: ordered. Radiology: ordered.  Risk Prescription drug management.   This patient presents to the ED for concern of abdominal pain, nausea, vomiting, and diarrhea, this involves an extensive number of treatment options, and is a complaint that carries with it a high risk of complications and morbidity.   Co morbidities that complicate the patient evaluation  Hx polysubstance abuse  Lab Tests:  I Ordered, and personally interpreted labs.  The pertinent results include:  ketonuria, WBC 15.2. No other acute laboratory findings   Imaging Studies ordered:  I ordered imaging studies including Ct abdomen pelvis with contrast  I independently visualized and interpreted imaging which showed  No acute findings I agree with the radiologist interpretation   Cardiac Monitoring: / EKG:  The patient was maintained on a cardiac monitor.  I personally viewed and interpreted the cardiac monitored which showed an underlying rhythm of: no STEMI   Problem List / ED Course / Critical interventions / Medication management  I ordered medication including phenergan and droperidol for nausea and vomiting, fluids for dehydration, and ativan for withdrawal Reevaluation of the patient after these medicines showed that the patient improved I have reviewed the patients home medicines and have made adjustments as needed   Social Determinants of Health:  Hx polysubstance abuse   Test / Admission - Considered:  Patient is nontoxic, nonseptic appearing, in no apparent distress.  Patient's pain and other symptoms adequately managed  in emergency department.  Fluid bolus given.  Labs, imaging and vitals reviewed.  Patient does not meet the SIRS or Sepsis criteria.  On repeat exam patient does not have a surgical abdomin and there are no peritoneal signs.  No indication of appendicitis, bowel obstruction, bowel perforation, cholecystitis, diverticulitis. After medication patient resting comfortably in bed in no acute distress. States his symptoms have significantly improved. Suspect symptoms are related to heroin withdrawal.  Patient discharged home with symptomatic treatment and given strict instructions for follow-up with their primary care physician.  I have also discussed reasons to return immediately to the ER.  Patient expresses understanding and agrees with plan. Discharged in stable condition.   Final Clinical Impression(s) /  ED Diagnoses Final diagnoses:  Nausea vomiting and diarrhea  Polysubstance abuse (HCC)  Generalized abdominal pain    Rx / DC Orders ED Discharge Orders          Ordered    ondansetron (ZOFRAN-ODT) 4 MG disintegrating tablet  Every 8 hours PRN        08/14/21 1456          An After Visit Summary was printed and given to the patient.     Vear Clock 08/14/21 1457    Linwood Dibbles, MD 08/14/21 [phone]

## 2021-08-22 ENCOUNTER — Other Ambulatory Visit (HOSPITAL_COMMUNITY): Payer: Self-pay

## 2021-08-23 ENCOUNTER — Encounter (HOSPITAL_COMMUNITY): Payer: Self-pay | Admitting: Registered Nurse

## 2021-08-23 ENCOUNTER — Ambulatory Visit (HOSPITAL_COMMUNITY)
Admission: EM | Admit: 2021-08-23 | Discharge: 2021-08-23 | Disposition: A | Discharge status: Home / Self Care | Payer: Medicaid Other | Attending: Registered Nurse | Admitting: Registered Nurse

## 2021-08-23 DIAGNOSIS — Z5900 Homelessness unspecified: Secondary | ICD-10-CM | POA: Insufficient documentation

## 2021-08-23 DIAGNOSIS — Z20822 Contact with and (suspected) exposure to covid-19: Secondary | ICD-10-CM | POA: Insufficient documentation

## 2021-08-23 DIAGNOSIS — F1910 Other psychoactive substance abuse, uncomplicated: Secondary | ICD-10-CM | POA: Insufficient documentation

## 2021-08-23 DIAGNOSIS — F10930 Alcohol use, unspecified with withdrawal, uncomplicated: Secondary | ICD-10-CM | POA: Insufficient documentation

## 2021-08-23 DIAGNOSIS — F1193 Opioid use, unspecified with withdrawal: Secondary | ICD-10-CM | POA: Diagnosis present

## 2021-08-23 DIAGNOSIS — F19939 Other psychoactive substance use, unspecified with withdrawal, unspecified: Secondary | ICD-10-CM | POA: Diagnosis present

## 2021-08-23 DIAGNOSIS — F1123 Opioid dependence with withdrawal: Secondary | ICD-10-CM | POA: Insufficient documentation

## 2021-08-23 LAB — POCT URINE DRUG SCREEN - MANUAL ENTRY (I-SCREEN)
POC Amphetamine UR: NOT DETECTED
POC Buprenorphine (BUP): NOT DETECTED
POC Cocaine UR: NOT DETECTED
POC Marijuana UR: POSITIVE — AB
POC Methadone UR: NOT DETECTED
POC Methamphetamine UR: NOT DETECTED
POC Morphine: NOT DETECTED
POC Oxazepam (BZO): POSITIVE — AB
POC Oxycodone UR: NOT DETECTED
POC Secobarbital (BAR): NOT DETECTED

## 2021-08-23 LAB — URINALYSIS, ROUTINE W REFLEX MICROSCOPIC
Bilirubin Urine: NEGATIVE
Glucose, UA: NEGATIVE mg/dL
Hgb urine dipstick: NEGATIVE
Ketones, ur: NEGATIVE mg/dL
Leukocytes,Ua: NEGATIVE
Nitrite: NEGATIVE
Protein, ur: NEGATIVE mg/dL
Specific Gravity, Urine: 1.012 (ref 1.005–1.030)
pH: 7 (ref 5.0–8.0)

## 2021-08-23 LAB — RESP PANEL BY RT-PCR (FLU A&B, COVID) ARPGX2
Influenza A by PCR: NEGATIVE
Influenza B by PCR: NEGATIVE
SARS Coronavirus 2 by RT PCR: NEGATIVE

## 2021-08-23 LAB — POC SARS CORONAVIRUS 2 AG: SARSCOV2ONAVIRUS 2 AG: NEGATIVE

## 2021-08-23 MED ORDER — CLONIDINE HCL 0.1 MG PO TABS: 0.1000 mg | ORAL_TABLET | ORAL | Status: DC

## 2021-08-23 MED ORDER — CHLORDIAZEPOXIDE HCL 25 MG PO CAPS: 25.0000 mg | ORAL_CAPSULE | Freq: Four times a day (QID) | ORAL | Status: DC | PRN

## 2021-08-23 MED ORDER — CLONIDINE HCL 0.1 MG PO TABS: 0.1000 mg | ORAL_TABLET | Freq: Four times a day (QID) | ORAL | Status: DC

## 2021-08-23 MED ORDER — ACETAMINOPHEN 325 MG PO TABS: 650.0000 mg | ORAL_TABLET | Freq: Four times a day (QID) | ORAL | Status: DC | PRN

## 2021-08-23 MED ORDER — TRAZODONE HCL 50 MG PO TABS: 50.0000 mg | ORAL_TABLET | Freq: Every evening | ORAL | Status: DC | PRN

## 2021-08-23 MED ORDER — ONDANSETRON 4 MG PO TBDP
4.0000 mg | ORAL_TABLET | Freq: Four times a day (QID) | ORAL | Status: DC | PRN
  Administered 2021-08-23: 4 mg via ORAL
  Filled 2021-08-23: qty 1

## 2021-08-23 MED ORDER — THIAMINE HCL 100 MG PO TABS: 100.0000 mg | ORAL_TABLET | Freq: Every day | ORAL | Status: DC

## 2021-08-23 MED ORDER — LOPERAMIDE HCL 2 MG PO CAPS
2.0000 mg | ORAL_CAPSULE | ORAL | Status: DC | PRN
  Administered 2021-08-23: 2 mg via ORAL
  Filled 2021-08-23: qty 1

## 2021-08-23 MED ORDER — ALUM & MAG HYDROXIDE-SIMETH 200-200-20 MG/5ML PO SUSP: 30.0000 mL | ORAL | Status: DC | PRN

## 2021-08-23 MED ORDER — MAGNESIUM HYDROXIDE 400 MG/5ML PO SUSP: 30.0000 mL | Freq: Every day | ORAL | Status: DC | PRN

## 2021-08-23 MED ORDER — DICYCLOMINE HCL 20 MG PO TABS
20.0000 mg | ORAL_TABLET | Freq: Four times a day (QID) | ORAL | Status: DC | PRN
  Administered 2021-08-23: 20 mg via ORAL
  Filled 2021-08-23: qty 1

## 2021-08-23 MED ORDER — NAPROXEN 500 MG PO TABS: 500.0000 mg | ORAL_TABLET | Freq: Two times a day (BID) | ORAL | Status: DC | PRN

## 2021-08-23 MED ORDER — HYDROXYZINE HCL 25 MG PO TABS
25.0000 mg | ORAL_TABLET | Freq: Four times a day (QID) | ORAL | Status: DC | PRN
  Administered 2021-08-23: 25 mg via ORAL
  Filled 2021-08-23: qty 1

## 2021-08-23 MED ORDER — CLONIDINE HCL 0.1 MG PO TABS: 0.1000 mg | ORAL_TABLET | Freq: Every day | ORAL | Status: DC

## 2021-08-23 MED ORDER — ADULT MULTIVITAMIN W/MINERALS CH
1.0000 | ORAL_TABLET | Freq: Every day | ORAL | Status: DC
  Administered 2021-08-23: 1 via ORAL
  Filled 2021-08-23: qty 1

## 2021-08-23 MED ORDER — CLONIDINE HCL 0.1 MG PO TABS
0.1000 mg | ORAL_TABLET | Freq: Four times a day (QID) | ORAL | Status: DC
  Administered 2021-08-23: 0.1 mg via ORAL
  Filled 2021-08-23: qty 1

## 2021-08-23 MED ORDER — SULFAMETHOXAZOLE-TRIMETHOPRIM 800-160 MG PO TABS
1.0000 | ORAL_TABLET | Freq: Two times a day (BID) | ORAL | Status: DC
  Administered 2021-08-23: 1 via ORAL
  Filled 2021-08-23: qty 1

## 2021-08-23 NOTE — ED Notes (Signed)
Pt arrived to the unit.

## 2021-08-23 NOTE — ED Provider Notes (Signed)
FBC/OBS ASAP Discharge Summary  Date and Time: 08/23/2021 3:40 PM  Name: Nicholas [name]  MRN:  [id number]   Discharge Diagnoses:  Final diagnoses:  Polysubstance abuse (HCC)  Opioid withdrawal (HCC)  Alcohol withdrawal syndrome without complication (HCC)    Subjective: Nicholas [name] 51 y.o., male patient presented to [facility] as a walk in accompanied by his son Arelia Sneddon(Justin Chiton [phone]) with complaints that he was sent to urgent care from [facility] related to having multiple symptoms of withdrawal and told him he needed to be medically cleared "but they said that once I finished I could come back."     Nicholas [name], 51 y.o., male patient seen face to face by this provider, consulted with Dr. Nelly RoutArchana Kumar; and chart reviewed on 08/23/21.  On evaluation Nicholas [name] reports that he has just finished 7 days at [facility] [place] and [facility].  Patient denies psychiatric history other than polysubstance abuse.  He reports he has been an addict for 20 years and never sought treatment.  States he has been drug free for 7 days.  States he used Fentanyl gm daily, Xanax 10 mg daily, and a pint of alcohol daily.  No prior psychiatric hospitalization, outpatient services, psychotropic meds, suicide attempt or self-harming behaviors.  Her reports he is currently homeless and has been unemployed for about 3 weeks.  Patient denies suicidal/self-harm/homicidal ideation, psychosis, and paranoia.  Plan is to return to [facility] once medically cleared (lessening of withdrawal symptoms).   During evaluation Nicholas [name] is sitting upright in chair with no noted distress.  He is alert, oriented x 4, calm, cooperative and attentive.  His mood is euthymic with congruent affect.  He has normal speech, and behavior.  Objectively there is no evidence of psychosis/mania or delusional thinking.  Patient is able to converse coherently, goal directed thoughts, no distractibility, or pre-occupation.  He also denies  suicidal/self-harm/homicidal ideation, psychosis, and paranoia.  Patient answered question appropriately.   Stay Summary: Patient admitted to continuous assessment unit at 11:24 AM 08/23/2021 and is now stating that he doesn't need to have any further treatment "I've been detox for 9 days now I don't think I need any further treatment and I want to go home.  Patient continues to deny suicidal/self-harm/homicidal ideation, psychosis, and paranoia.  He reports that he is feeling better and that he no longer needs any assistance with the withdrawal (Xanax, Fentanyl, and alcohol).  Patient has completed a 7 day detox program at [facility].   He is instructed to follow up with resource given and to follow up with cardiologists as recommended by [facility] after ED visit 08/22/21 with complaints of chest pain.    Total Time spent with patient: 20 minutes  Past Psychiatric History: Polysubstance abuse and dependence (alcohol, benzodiazepine, fentanyl) Past Medical History:  Past Medical History:  Diagnosis Date   Chronic back pain    ETOH abuse    Opiate addiction (HCC)    History reviewed. No pertinent surgical history. Family History:  Family History  Problem Relation Age of Onset   Alcoholism Father    Testicular cancer Father    Heart Problems Father    Hernia Father    Intracerebral hemorrhage Maternal Grandmother    Family Psychiatric History: None reported Social History:  Social History   Substance and Sexual Activity  Alcohol Use Yes     Social History   Substance and Sexual Activity  Drug Use Yes   Comment:  heroin    Social History   Socioeconomic History   Marital status: Single    Spouse name: Not on file   Number of children: Not on file   Years of education: Not on file   Highest education level: Not on file  Occupational History   Not on file  Tobacco Use   Smoking status: Every Day    Packs/day: 2.00    Types: Cigarettes   Smokeless tobacco: Never  Vaping Use    Vaping Use: Never used  Substance and Sexual Activity   Alcohol use: Yes   Drug use: Yes    Comment: heroin   Sexual activity: Not on file  Other Topics Concern   Not on file  Social History Narrative   Not on file   Social Determinants of Health   Financial Resource Strain: Not on file  Food Insecurity: Not on file  Transportation Needs: Not on file  Physical Activity: Not on file  Stress: Not on file  Social Connections: Not on file   SDOH:  SDOH Screenings   Alcohol Screen: Not on file  Depression (PHQ2-9): Low Risk  (08/23/2021)   Depression (PHQ2-9)    PHQ-2 Score: 0  Financial Resource Strain: Not on file  Food Insecurity: Not on file  Housing: Not on file  Physical Activity: Not on file  Social Connections: Not on file  Stress: Not on file  Tobacco Use: High Risk (08/23/2021)   Patient History    Smoking Tobacco Use: Every Day    Smokeless Tobacco Use: Never    Passive Exposure: Not on file  Transportation Needs: Not on file    Tobacco Cessation:  A prescription for an FDA-approved tobacco cessation medication was offered at discharge and the patient refused  Current Medications:  Current Facility-Administered Medications  Medication Dose Route Frequency Provider Last Rate Last Admin   acetaminophen (TYLENOL) tablet 650 mg  650 mg Oral Q6H PRN Jimmey Hengel B, NP       alum & mag hydroxide-simeth (MAALOX/MYLANTA) 200-200-20 MG/5ML suspension 30 mL  30 mL Oral Q4H PRN Leisel Pinette B, NP       cloNIDine (CATAPRES) tablet 0.1 mg  0.1 mg Oral QID Mathew Storck B, NP   0.1 mg at 08/23/21 1451   Followed by   Melene Muller ON 08/25/2021] cloNIDine (CATAPRES) tablet 0.1 mg  0.1 mg Oral BH-qamhs Itai Barbian B, NP       Followed by   Melene Muller ON 08/28/2021] cloNIDine (CATAPRES) tablet 0.1 mg  0.1 mg Oral QAC breakfast Milledge Gerding B, NP       dicyclomine (BENTYL) tablet 20 mg  20 mg Oral Q6H PRN Loraina Stauffer B, NP   20 mg at 08/23/21 1247   hydrOXYzine (ATARAX)  tablet 25 mg  25 mg Oral Q6H PRN Khala Tarte B, NP   25 mg at 08/23/21 1248   loperamide (IMODIUM) capsule 2-4 mg  2-4 mg Oral PRN Zuha Dejonge B, NP   2 mg at 08/23/21 1426   magnesium hydroxide (MILK OF MAGNESIA) suspension 30 mL  30 mL Oral Daily PRN Gared Gillie B, NP       multivitamin with minerals tablet 1 tablet  1 tablet Oral Daily Ihor Meinzer B, NP   1 tablet at 08/23/21 1327   naproxen (NAPROSYN) tablet 500 mg  500 mg Oral BID PRN Jonah Gingras B, NP       ondansetron (ZOFRAN-ODT) disintegrating tablet 4 mg  4 mg Oral Q6H PRN  Doyal Saric B, NP   4 mg at 08/23/21 1248   sulfamethoxazole-trimethoprim (BACTRIM DS) 800-160 MG per tablet 1 tablet  1 tablet Oral BID Emiliya Chretien B, NP   1 tablet at 08/23/21 1329   [START ON 08/24/2021] thiamine tablet 100 mg  100 mg Oral Daily Roby Donaway B, NP       traZODone (DESYREL) tablet 50 mg  50 mg Oral QHS PRN Henrik Orihuela B, NP       Current Outpatient Medications  Medication Sig Dispense Refill   naproxen (NAPROSYN) 500 MG tablet Take 500 mg by mouth in the morning and at bedtime. Take for 7 days starting on 08/23/21.     ondansetron (ZOFRAN-ODT) 4 MG disintegrating tablet Dissolve 1 tablet (4 mg total) by mouth every 8 (eight) hours as needed for nausea or vomiting. 20 tablet 0   OVER THE COUNTER MEDICATION Take 1 tablet by mouth daily. Vitamin B     sulfamethoxazole-trimethoprim (BACTRIM DS) 800-160 MG tablet Take 1 tablet by mouth 2 (two) times daily. Take for 7 days starting on 08/21/21.      PTA Medications: (Not in a hospital admission)      08/23/2021   11:24 AM  Depression screen PHQ 2/9  Decreased Interest 0  Down, Depressed, Hopeless 0  PHQ - 2 Score 0    Flowsheet Row ED from [facility]/2021 [facility]  C-SSRS RISK CATEGORY No Risk       Musculoskeletal  Strength & Muscle Tone: within normal limits Gait & Station: normal Patient leans: N/A  Psychiatric Specialty Exam   Presentation  General Appearance: Appropriate for Environment; Casual  Eye Contact:Good  Speech:Clear and Coherent; Normal Rate  Speech Volume:Normal  Handedness:Right   Mood and Affect  Mood:Euthymic  Affect:Appropriate; Congruent   Thought Process  Thought Processes:Coherent; Goal Directed  Descriptions of Associations:Intact  Orientation:Full (Time, Place and Person)  Thought Content:Logical     Hallucinations:Hallucinations: None  Ideas of Reference:None  Suicidal Thoughts:Suicidal Thoughts: No  Homicidal Thoughts:Homicidal Thoughts: No   Sensorium  Memory:Immediate Good; Recent Good; Remote Good  Judgment:Intact  Insight:Present   Executive Functions  Concentration:Good  Attention Span:Good  Recall:Good  Fund of Knowledge:Good  Language:No data recorded  Psychomotor Activity  Psychomotor Activity:Psychomotor Activity: Tremor   Assets  Assets:Communication Skills; Desire for Improvement; Social Support   Sleep  Sleep:Sleep: Fair   Nutritional Assessment (For OBS and FBC admissions only) Has the patient had a weight loss or gain of 10 pounds or more in the last 3 months?: No Has the patient had a decrease in food intake/or appetite?: No Does the patient have dental problems?: No Does the patient have eating habits or behaviors that may be indicators of an eating disorder including binging or inducing vomiting?: No Has the patient recently lost weight without trying?: 0 Has the patient been eating poorly because of a decreased appetite?: 0 Malnutrition Screening Tool Score: 0    Physical Exam  Physical Exam Vitals and nursing note reviewed. Exam conducted with a chaperone present.  Constitutional:      General: He is not in acute distress.    Appearance: Normal appearance. He is not ill-appearing.  Cardiovascular:     Rate and Rhythm: Normal rate.  Pulmonary:     Effort: Pulmonary effort is normal.  Skin:    General: Skin is  warm and dry.  Neurological:     Mental Status: He is alert and oriented to person, place, and time.  Psychiatric:        Attention and Perception: Attention and perception normal. He does not perceive auditory or visual hallucinations.        Mood and Affect: Mood and affect normal.        Speech: Speech normal.        Behavior: Behavior normal. Behavior is cooperative.        Thought Content: Thought content normal. Thought content is not paranoid or delusional. Thought content does not include homicidal or suicidal ideation.        Cognition and Memory: Cognition normal.        Judgment: Judgment normal.    Review of Systems  Constitutional:  Positive for diaphoresis.  HENT: Negative.    Eyes: Negative.   Respiratory: Negative.    Cardiovascular: Negative.   Gastrointestinal:  Positive for diarrhea and nausea.  Genitourinary: Negative.   Musculoskeletal:  Positive for myalgias.  Skin: Negative.   Neurological:  Positive for tremors.  Endo/Heme/Allergies: Negative.   Psychiatric/Behavioral:  Positive for substance abuse. Negative for depression, hallucinations and suicidal ideas.    Blood pressure 137/86, pulse (!) 127, temperature 98.6 F (37 C), temperature source Oral, resp. rate 20, SpO2 98 %. There is no height or weight on file to calculate BMI.  Demographic Factors:  Male, Caucasian, Low socioeconomic status, and Unemployed  Loss Factors: Homeless  Historical Factors: Chronic polysubstance use  Risk Reduction Factors:   Sense of responsibility to family, Religious beliefs about death, and Positive social support  Continued Clinical Symptoms:  Alcohol/Substance Abuse/Dependencies  Cognitive Features That Contribute To Risk:  None    Suicide Risk:  Minimal: No identifiable suicidal ideation.  Patients presenting with no risk factors but with morbid ruminations; may be classified as minimal risk based on the severity of the depressive symptoms  Plan Of  Care/Follow-up recommendations:    Follow-up Information     Services, Alcohol And Drug.   Specialty: Behavioral Health Why: [facility] Triage Times (no appointment necessary).  (offers outpatient therapy and intensive outpatient substance abuse therapy). Contact information: [address] [place] [address] [phone]         [facility].   Why: Admissions are currently completed Monday through Friday at 8am; both appointments and walk-ins are accepted. Any individual that is a [facility] resident may present for a substance abuse screening and assessment for admission Contact information: [address] [place] [address] [phone]                  Discharge Instructions      Substance Abuse Treatment Programs  Intensive Outpatient Programs [facility]     [address]      [place], [place]                   [phone]       [facility] [address] [place] [address] [place], [place] [phone]  [name]     (Inpatient and outpatient)     [address]           [phone]    [facility] [phone] (Suboxone and Methadone)  [address]      [place], [place] [address]      [phone]       [address] [place], [place] [phone]  [facility] (Outpatient/Inpatient, [facility])    (insurance only) [phone]  [facility] (Groups & Residential) [place], [place] [phone]     [facility]     [address]     [place], [place]      [phone]       [facility] (for caregivers and family) [phone] [address] [place], [place] [phone]      Residential Treatment Programs [facility]      [address], [place], [place] [address]  [phone]       [facility] [address] [place], [place] [address] [phone]  [facility] [place]         [phone]       [facility] [phone]  [facility] ([facility]             [address]                                         [place], [place]                                                [phone] or [phone]                               [facility] [place] [address] [place], [address] [phone]  [facility]    [address]      [place], [place]     [phone]       [facility] [address] [place], [place] [phone]  [facility]   [address] [place], [place] [address]     [phone]      Admissions: 8am-3pm M-F  [facility] ([facility]) [address] [place], [place] [phone]  [facility]: Residential Program [phone] Days)   [place], [place]      [phone] or [phone]     [facility]: [facility] [place], [place] (Walk in Hours over the weekend or by referral)  [facility] [address] [place], [place], [place] [address] [phone]  Crisis Mobile: [facility]:  [phone] (for crisis response 24 hours a day) [facility] Hotline:      [phone] Outpatient Psychiatry and Counseling  [facility]: Mobile Crisis Management 24 hours:  [phone]  [facility] of the [employer] sliding scale fee and walk in schedule: M-F 8am-12pm/1pm-3pm [address]  [place], [place] [address] [phone]  [facility] [address] [place], [place] [address] [phone]  [facility] (Formerly known as [facility] [employer])- new patient walk-in appointments available Monday - Friday 8am -3pm.          [address] [employer] [place], [place] [address] [phone] or crisis line- [phone]  [facility] Out[facility] Services/ Intensive Outpatient Therapy Program [address] [place], [place]  [address] [phone]  [facility]                  Crisis Services      [phone] [address]     [place], [place] [address]                 [facility]   [facility] [phone]. [address] [place], [place] [address]   [employer] of Care          [address] [place]  [place], [place] [address]       [phone]  [facility] [address] [place], [place] [address] [phone]  [facility]    [address]    [place], [place] [address]     [phone]       Andee Poles, MD     [address]     [place] [address]     [phone]       [facility] [address] [place] [address]  [facility]     [address] [place], [place]      [phone]       [facility] Eulogio Ditch, MD [address] [place], [place] [address] [phone]  [facility]     [address]     [place], [place] [address]     [phone]       [facility] [address] [place], [place] [address] [phone] Resources for Temporary Residential Assistance/Crisis Centers  [facility]) M-F 8am-3pm   [address] [place], [place] [address]   [phone] Services include: laundry, barbering, support groups, case management, phone  & computer access, showers, AA/NA mtgs, mental health/substance abuse nurse, job skills class, disability information, VA assistance, spiritual classes, etc.   HOMELESS SHELTERS  [facility]     [facility]   [address] [place]     [phone]              [employer] (women and children)       [address] [place], [place] [address] [phone] [email or web address] for application and process Application Required  [facility]  [employer] [facility]   [address]    [place] [address]     [phone]                    [facility] of [place] [address] [place], [place] [address] [phone] application appt.) Application Required  [facility] (women only)    [address]     [place], [place] [address]     [phone]      Intake starts 6pm daily Need valid ID, SSC, & Police report [employer] [address] [place], [place] [phone] Application Required  [employer] (men only)     [address]      [place], [place]     [phone]       [facility] [place] [facility] (Pregnant women only) [address] [place], [place] [phone]  [facility]      [address]      [place], [place] [address]     [phone]             [facility] [address] [place], [place] [phone] 90 day commitment/SA/Application process  [facility](men only)     [address]     [place], [place]     [phone]       Check-in at 7pm  Crisis Ministry of [facility] [address] [place], [place] [address] [phone] Men/Women/Women and Children must be there by 7 pm  [facility] [place], [place] [phone]                     Disposition: No evidence of imminent risk to self or others at present.   Patient does not meet criteria for psychiatric inpatient admission. Supportive therapy provided about ongoing stressors. Refer to IOP. Discussed crisis plan, support from social network, calling 911, coming to the Emergency Department, and calling Suicide Hotline.   Danyele Smejkal, NP 08/23/2021, 3:40 PM

## 2021-08-23 NOTE — Progress Notes (Addendum)
Patient requesting discharge states he is in "day 8 of being clean, I think I can do this on my own now." Patient called son to come and pick him up from urgent care. Nurse informed provider of patient's request. RN provided education about the facility based crisis unit, different groups and activities provided, patient still declines wanting to stay at the urgent care.

## 2021-08-23 NOTE — Progress Notes (Addendum)
Patient is alert and oriented admitted to the observation unit until COVID PCR results then patient will transfer over to Facility Based Crisis Unit. Patient is pleasant and cooperative. Offered food, patient states, " I have not been able to keep anything down all day. I have not been to sleep in several days this is a 20 year habit I am trying to kick." Patient oriented to the unit, right forearm swollen and red patient currently taking antibiotic, patient taking antibiotic, several tattoos on arm and legs, no open wounds. Nursing staff will continue to monitor.

## 2021-08-23 NOTE — Discharge Instructions (Addendum)
Substance Abuse Treatment Programs ° °Intensive Outpatient Programs °[facility]     °[address]      °[place], [place]                   °[phone]      ° °[facility] °[address] °[place], [place] °[phone] ° °[facility]     °(Inpatient and outpatient)     °[address]           °[phone]   ° °[facility] °[phone] (Suboxone and Methadone) ° °[address]      °[place], [place] [address]      °[phone]      ° °[address] °[place], [place] °[phone] ° °[facility] (Outpatient/Inpatient, Chemical)    °(insurance only) [phone]      °       °[facility] (Groups & Residential) °[place], [place] °[phone] ° °   °[facility]     °[address]     °[place], [place]      °[phone]      ° °[facility] (for caregivers and family) °[address] °[place], [place] °336-299-4655 ° ° ° ° ° °Residential Treatment Programs °[facility]      °[address], [place], [place] [address]  °([phone]      ° °[facility] °[address] [place], [place] [address] °[phone] ° °[facility]        °[phone]      ° °[facility] °1-800-659-3381 ° °[facility]             °[address]                                         °[place], [place]                                                °877-615-2722 or 336-784-9470                              ° °[facility] [place] °[address] °[place], [address] °[phone] ° °[facility]    °[address]      °[place], [place]     °[phone]      ° °[facility] °[address] °[place], [place] °[phone] ° °[facility]   °[address]     °[place], [place] [address]     °[phone]      °Admissions: 8am-3pm M-F ° °[facility] °[address] °[place],  [place] °[phone] ° °[facility]: Residential Program (90 Days)   °[place], [place]      °336-725-8389 or [phone]    ° °[facility] °[place],  °(Walk in Hours over the weekend or by referral) ° °[facility] °[address], [place],  [address] °([phone] ° °Crisis Mobile: [facility]:  1-877-626-1772 (for crisis response 24 hours a day) °[facility] Hotline:      1-800-256-2452 °Outpatient Psychiatry and Counseling ° °[facility]: Mobile Crisis   Management 24 hours:  1-877-626-1772 ° °[facility] sliding scale fee and walk in schedule: M-F 8am-12pm/1pm-3pm °[address]  °[place], [place] [address] °[phone] ° °[facility] °[address] °[place], [place] [address] °[phone] ° °[facility] (Former[facility] known [facility])- new patient walk-in appointments available Monday - Friday 8am -3pm.          °[address] °[place], [place] [address] °336-676-6840 or crisis line- 336-676-6905 ° ° [facility] Intensive Outpatient Therapy Program °[address] °[place], [place] [address] °336-832-9804 ° °[facility]                  °Crisis Services      °[phone]      °[address]     °[place], [place] [address]                ° °[facility]   °[facility] °[phone] °[address] °[place], [place] [address] ° ° °[facility]          °[address],  °[place], [place] [address]       °([phone] ° °[facility] °[address] °[place], [place] [address] °336-292-1510 ° °[facility]    °[address]    °[place], [place] [address]     °[phone]      ° °Parish McKinney, MD     °[address]     °[place] [address]     °[phone]     °  °[facility] °[address] °[place] [address] ° °[facility]     °[address]     °[place]      °336-542-2076      ° °[facility] °Shamsher Ahluwalia, MD °[address] °[place], [place] [address] °[phone] ° °[facility]     °[address]     °[place], [place] [address]     °336-274-1237      ° °[facility] °[address] °[place], [place] [address] °336-854-4450 °Resources for Temporary Residential Assistance/Crisis Centers ° °DAY CENTERS °[facility] °M-F 8am-3pm   °[address] [place], [place] [address]   336-332-0824 °Services include: laundry, barbering, support groups, case management, phone  & computer access, showers, AA/NA mtgs, mental health/substance abuse nurse, job skills class, disability information, VA assistance, spiritual classes, etc.  ° °HOMELESS SHELTERS ° °[facility]     °[facility]   °[address], [place]     °[phone]       °       °[facility] (women and children)       °[address] °[place], [place] [address] °336-275-0820 °[email or web address] for application and process °Application Required ° °[facility]   °[address]    °[place] [address]     °[phone]       °             °[facility] °[address] °[place], [place] [address] °[phone] °336-235-0363(schedule application appt.) °Application Required ° °[facility] (women only)    °[address]     °[place], [place] [address]     °336-884-1039      °  Intake starts 6pm daily °Need valid ID, SSC, & Police report °[facility] [place] °[address] °[place], [place] °[phone] °Application Required ° °[facility] (men only)     °[address]      °[place], [place]     °[phone]      ° °[facility] °(Pregnant women only) °[address] °[place], [place] °336-275-0206 ° °[facility]      °[address]      °[place], [place] [address]     °[phone]      °       °[facility] °[address] °[place], [place] °336-723-1848 °90 day commitment/SA/Application process ° °[facility](men only)     °[address]     °[place], [place]     °[phone]       °Check-in at 7pm     °       °[facility] °[address] °[place], [place] [address] °336-248-6684 °Men/Women/Women and Children must be there by 7 pm ° °[facility] °[place], [place] °[phone]                ° °

## 2021-08-23 NOTE — ED Provider Notes (Signed)
[facility] Continuous Assessment Admission H&P  Date: 08/23/21 Patient Name: Nicholas [name] MRN: [id number] Chief Complaint: No chief complaint on file.     Diagnoses:  Final diagnoses:  Polysubstance abuse (HCC)  Opioid withdrawal (HCC)  Alcohol withdrawal syndrome without complication (HCC)    HPI: HPI: [address] Broom 51 y.o., male patient presented to [facility] as a walk in accompanied by his son Nicholas [name] [phone]) with complaints that he was sent to urgent care from [facility] related to having multiple symptoms of withdrawal and told him he needed to be medically cleared "but they said that once I finished I could come back."    Nicholas [name], 51 y.o., male patient seen face to face by this provider, consulted with Dr. Nelly Rout; and chart reviewed on 08/23/21.  On evaluation Nicholas [name] reports that he has just finished [facility] days at [facility] [place] and sent to [facility].  Patient denies psychiatric history other than polysubstance abuse.  He reports he has been an addict for 20 years and never sought treatment.  States he has been drug free for 7 days.  States he used Fentanyl gm daily, Xanax 10 mg daily, and a pint of alcohol daily.  No prior psychiatric hospitalization, outpatient services, psychotropic meds, suicide attempt or self-harming behaviors.  Her reports he is currently homeless and has been unemployed for about 3 weeks.  Patient denies suicidal/self-harm/homicidal ideation, psychosis, and paranoia.  Plan is to return to [facility] once medically cleared (lessening of withdrawal symptoms).   During evaluation Nicholas [name] is sitting upright in chair with no noted distress.  He is alert, oriented x 4, calm, cooperative and attentive.  His mood is euthymic with congruent affect.  He has normal speech, and behavior.  Objectively there is no evidence of psychosis/mania or delusional thinking.  Patient is able to converse coherently, goal directed thoughts, no  distractibility, or pre-occupation.  He also denies suicidal/self-harm/homicidal ideation, psychosis, and paranoia.  Patient answered question appropriately.     PHQ 2-9:   Flowsheet Row [facility]  C-SSRS RISK CATEGORY No Risk        Total Time spent with patient: 30 minutes  Musculoskeletal  Strength & Muscle Tone: within normal limits Gait & Station: normal Patient leans: N/A  Psychiatric Specialty Exam  Presentation General Appearance: Appropriate for Environment; Casual  Eye Contact:Good  Speech:Clear and Coherent; Normal Rate  Speech Volume:Normal  Handedness:Right   Mood and Affect  Mood:Euthymic  Affect:Appropriate; Congruent   Thought Process  Thought Processes:Coherent; Goal Directed  Descriptions of Associations:Intact  Orientation:Full (Time, Place and Person)  Thought Content:Logical    Hallucinations:Hallucinations: None  Ideas of Reference:None  Suicidal Thoughts:Suicidal Thoughts: No  Homicidal Thoughts:Homicidal Thoughts: No   Sensorium  Memory:Immediate Good; Recent Good; Remote Good  Judgment:Intact  Insight:Present   Executive Functions  Concentration:Good  Attention Span:Good  Recall:Good  Fund of Knowledge:Good  Language:No data recorded  Psychomotor Activity  Psychomotor Activity:Psychomotor Activity: Tremor   Assets  Assets:Communication Skills; Desire for Improvement; Social Support   Sleep  Sleep:Sleep: Fair   Nutritional Assessment (For OBS and FBC admissions only) Has the patient had a weight loss or gain of 10 pounds or more in the last 3 months?: No Has the patient had a decrease in food intake/or appetite?: No Does the patient have dental problems?: No Does the patient have eating habits or behaviors that may be indicators of an eating disorder  including binging or inducing vomiting?: No Has the patient recently lost weight without trying?:  0 Has the patient been eating poorly because of a decreased appetite?: 0 Malnutrition Screening Tool Score: 0    Physical Exam Vitals and nursing note reviewed. Exam conducted with a chaperone present.  Constitutional:      General: He is not in acute distress.    Appearance: Normal appearance. He is not ill-appearing.  Cardiovascular:     Rate and Rhythm: Normal rate.  Pulmonary:     Effort: Pulmonary effort is normal.  Skin:    General: Skin is warm and dry.  Neurological:     Mental Status: He is alert and oriented to person, place, and time.  Psychiatric:        Attention and Perception: Attention and perception normal. He does not perceive auditory or visual hallucinations.        Mood and Affect: Mood and affect normal.        Speech: Speech normal.        Behavior: Behavior normal. Behavior is cooperative.        Thought Content: Thought content normal. Thought content is not paranoid or delusional. Thought content does not include homicidal or suicidal ideation.        Cognition and Memory: Cognition normal.        Judgment: Judgment normal.    Review of Systems  Constitutional:  Positive for diaphoresis.  HENT: Negative.    Eyes: Negative.   Respiratory: Negative.    Cardiovascular: Negative.   Gastrointestinal:  Positive for diarrhea and nausea.  Genitourinary: Negative.   Musculoskeletal:  Positive for myalgias.  Skin: Negative.   Neurological:  Positive for tremors.  Endo/Heme/Allergies: Negative.   Psychiatric/Behavioral:  Positive for substance abuse. Negative for depression, hallucinations and suicidal ideas.     Blood pressure (!) 121/93, pulse (!) 149, temperature 98.3 F (36.8 C), temperature source Oral, resp. rate 20, SpO2 98 %. There is no height or weight on file to calculate BMI.  Past Psychiatric History: Polysubstance abuse and dependence (alcohol, benzodiazepine, fentanyl)  Is the patient at risk to self? No  Has the patient been a risk to  self in the past 6 months? No .    Has the patient been a risk to self within the distant past? No   Is the patient a risk to others? No   Has the patient been a risk to others in the past 6 months? No   Has the patient been a risk to others within the distant past? No   Past Medical History:  Past Medical History:  Diagnosis Date   Chronic back pain    ETOH abuse    Opiate addiction (HCC)    History reviewed. No pertinent surgical history.  Family History:  Family History  Problem Relation Age of Onset   Alcoholism Father    Testicular cancer Father    Heart Problems Father    Hernia Father    Intracerebral hemorrhage Maternal Grandmother     Social History:  Social History   Socioeconomic History   Marital status: Single    Spouse name: Not on file   Number of children: Not on file   Years of education: Not on file   Highest education level: Not on file  Occupational History   Not on file  Tobacco Use   Smoking status: Every Day    Packs/day: 2.00    Types: Cigarettes   Smokeless tobacco: Never  Vaping Use   Vaping Use: Never used  Substance and Sexual Activity   Alcohol use: Yes   Drug use: Yes    Comment: heroin   Sexual activity: Not on file  Other Topics Concern   Not on file  Social History Narrative   Not on file   Social Determinants of Health   Financial Resource Strain: Not on file  Food Insecurity: Not on file  Transportation Needs: Not on file  Physical Activity: Not on file  Stress: Not on file  Social Connections: Not on file  Intimate Partner Violence: Not on file    SDOH:  SDOH Screenings   Alcohol Screen: Not on file  Depression (PHQ2-9): Low Risk  (08/23/2021)   Depression (PHQ2-9)    PHQ-2 Score: 0  Financial Resource Strain: Not on file  Food Insecurity: Not on file  Housing: Not on file  Physical Activity: Not on file  Social Connections: Not on file  Stress: Not on file  Tobacco Use: High Risk (08/23/2021)   Patient  History    Smoking Tobacco Use: Every Day    Smokeless Tobacco Use: Never    Passive Exposure: Not on file  Transportation Needs: Not on file    Last Labs:  Admission on 08/14/2021, Discharged on 08/14/2021  Component Date Value Ref Range Status   Sodium 08/14/2021 141  135 - 145 mmol/L Final   Potassium 08/14/2021 3.9  3.5 - 5.1 mmol/L Final   Chloride 08/14/2021 103  98 - 111 mmol/L Final   CO2 08/14/2021 27  22 - 32 mmol/L Final   Glucose, Bld 08/14/2021 106 (H)  70 - 99 mg/dL Final   Glucose reference range applies only to samples taken after fasting for at least 8 hours.   BUN 08/14/2021 21 (H)  6 - 20 mg/dL Final   Creatinine, Ser 08/14/2021 0.95  0.61 - 1.24 mg/dL Final   Calcium 53/74/8270 9.6  8.9 - 10.3 mg/dL Final   Total Protein 78/67/5449 7.8  6.5 - 8.1 g/dL Final   Albumin 20/10/710 4.3  3.5 - 5.0 g/dL Final   AST 19/75/8832 20  15 - 41 U/L Final   ALT 08/14/2021 18  0 - 44 U/L Final   Alkaline Phosphatase 08/14/2021 70  38 - 126 U/L Final   Total Bilirubin 08/14/2021 0.7  0.3 - 1.2 mg/dL Final   GFR, Estimated 08/14/2021 >60  >60 mL/min Final   Comment: (NOTE) Calculated using the CKD-EPI Creatinine Equation (2021)    Anion gap 08/14/2021 11  5 - 15 Final   Performed at [facility], [address], [place], [place] [address]   Lipase 08/14/2021 28  11 - 51 U/L Final   Performed at [facility], [address] [place], [place] [address]   WBC 08/14/2021 15.2 (H)  4.0 - 10.5 K/uL Final   RBC 08/14/2021 4.89  4.22 - 5.81 MIL/uL Final   Hemoglobin 08/14/2021 14.3  13.0 - 17.0 g/dL Final   HCT 30/94/0768 41.9  39.0 - 52.0 % Final   MCV 08/14/2021 85.7  80.0 - 100.0 fL Final   MCH 08/14/2021 29.2  26.0 - 34.0 pg Final   MCHC 08/14/2021 34.1  30.0 - 36.0 g/dL Final   RDW 08/81/1031 14.1  11.5 - 15.5 % Final   Platelets 08/14/2021 332  150 - 400 K/uL Final   nRBC 08/14/2021 0.0  0.0 - 0.2 % Final   Neutrophils Relative %  08/14/2021 69  %  Final   Neutro Abs 08/14/2021 10.4 (H)  1.7 - 7.7 K/uL Final   Lymphocytes Relative 08/14/2021 20  % Final   Lymphs Abs 08/14/2021 3.1  0.7 - 4.0 K/uL Final   Monocytes Relative 08/14/2021 11  % Final   Monocytes Absolute 08/14/2021 1.6 (H)  0.1 - 1.0 K/uL Final   Eosinophils Relative 08/14/2021 0  % Final   Eosinophils Absolute 08/14/2021 0.0  0.0 - 0.5 K/uL Final   Basophils Relative 08/14/2021 0  % Final   Basophils Absolute 08/14/2021 0.0  0.0 - 0.1 K/uL Final   Immature Granulocytes 08/14/2021 0  % Final   Abs Immature Granulocytes 08/14/2021 0.06  0.00 - 0.07 K/uL Final   Performed at [facility], [address] [place], [place] [address]   Color, Urine 08/14/2021 YELLOW  YELLOW Final   APPearance 08/14/2021 CLEAR  CLEAR Final   Specific Gravity, Urine 08/14/2021 1.021  1.005 - 1.030 Final   pH 08/14/2021 7.0  5.0 - 8.0 Final   Glucose, UA 08/14/2021 NEGATIVE  NEGATIVE mg/dL Final   Hgb urine dipstick 08/14/2021 NEGATIVE  NEGATIVE Final   Bilirubin Urine 08/14/2021 NEGATIVE  NEGATIVE Final   Ketones, ur 08/14/2021 20 (A)  NEGATIVE mg/dL Final   Protein, ur 94/85/4627 NEGATIVE  NEGATIVE mg/dL Final   Nitrite 03/50/0938 NEGATIVE  NEGATIVE Final   Leukocytes,Ua 08/14/2021 NEGATIVE  NEGATIVE Final   RBC / HPF 08/14/2021 0-5  0 - 5 RBC/hpf Final   WBC, UA 08/14/2021 0-5  0 - 5 WBC/hpf Final   Bacteria, UA 08/14/2021 RARE (A)  NONE SEEN Final   Mucus 08/14/2021 PRESENT   Final   Hyaline Casts, UA 08/14/2021 PRESENT   Final   Performed at [facility], [address], [place], [place] [address]    Allergies: Patient has no known allergies.  PTA Medications: (Not in a hospital admission)   Medical Decision Making  MCCORMICK MACON was admitted to [facility] continuous assessment unit for Drug withdrawal [facility]), crisis management, and stabilization. Routine labs ordered, which include  Lab  Orders         Resp Panel by RT-PCR (Flu A&B, Covid) Anterior Nasal Swab         CBC with Differential/Platelet         Comprehensive metabolic panel         Ethanol         Urinalysis, Routine w reflex microscopic Urine, Clean Catch         POCT Urine Drug Screen - (I-Screen)    Medication Management: Medications started Meds ordered this encounter  Medications   acetaminophen (TYLENOL) tablet 650 mg   alum & mag hydroxide-simeth (MAALOX/MYLANTA) 200-200-20 MG/5ML suspension 30 mL   magnesium hydroxide (MILK OF MAGNESIA) suspension 30 mL   thiamine tablet 100 mg   multivitamin with minerals tablet 1 tablet   DISCONTD: chlordiazePOXIDE (LIBRIUM) capsule 25 mg   hydrOXYzine (ATARAX) tablet 25 mg   loperamide (IMODIUM) capsule 2-4 mg   ondansetron (ZOFRAN-ODT) disintegrating tablet 4 mg   dicyclomine (BENTYL) tablet 20 mg   naproxen (NAPROSYN) tablet 500 mg   DISCONTD: cloNIDine (CATAPRES) tablet 0.1 mg   DISCONTD: cloNIDine (CATAPRES) tablet 0.1 mg   DISCONTD: cloNIDine (CATAPRES) tablet 0.1 mg   traZODone (DESYREL) tablet 50 mg    Will maintain continuous observation or safety. Social work will consult with family for collateral information and discuss discharge and follow up plan.  Recommendations  Based on my evaluation the patient does not appear to have an emergency medical condition.  Curties Conigliaro, NP 08/23/21  12:14 PM

## 2021-08-23 NOTE — BH Assessment (Addendum)
Comprehensive Clinical Assessment (CCA) Screening, Triage and Referral Note  08/23/2021 ORLAN AVERSA [id number]  Disposition: Provider completed MSE, prior to triage/screening. Disposition to be determined by the [facility] provider. Disposition pending. Provider to provide TTS with updates.   Chief Complaint: Unknown  Visit Diagnosis: Unknown  Patient Reported Information How did you hear about [place]? -- (unknown)  What Is the Reason for Your Visit/Call Today? Provider completed MSE, prior to triage/screening. Waiting on updates from the [facility] provider.  How Long Has This Been Causing You Problems? -- (unknown)  What Do You Feel Would Help You the Most Today? -- (Unknown)   Have You Recently Had Any Thoughts About Hurting Yourself? -- (Unknown)  Are You Planning to Commit Suicide/Harm Yourself At This time? No (Unknown)   Have you Recently Had Thoughts About Hurting Someone Else? -- (Unknown)  Are You Planning to Harm Someone at This Time? -- (Unknown)  Explanation: No data recorded  Have You Used Any Alcohol or Drugs in the Past 24 Hours? -- (Unknown)  How Long Ago Did You Use Drugs or Alcohol? No data recorded What Did You Use and How Much? No data recorded  Do You Currently Have a Therapist/Psychiatrist? No data recorded Name of Therapist/Psychiatrist: No data recorded  Have You Been Recently Discharged From Any Office Practice or Programs? No data recorded Explanation of Discharge From Practice/Program: No data recorded     [place] of Residence: Unknown   Patient Currently Receiving the Following Services: No data recorded  Determination of Need: -- (Unknown)   Options For Referral: -- (Unknown)   Discharge Disposition:     Melynda Ripple, Counselor

## 2022-01-15 ENCOUNTER — Encounter (HOSPITAL_COMMUNITY): Payer: Self-pay

## 2022-01-15 ENCOUNTER — Other Ambulatory Visit: Payer: Self-pay

## 2022-01-15 ENCOUNTER — Emergency Department (HOSPITAL_COMMUNITY)
Admission: EM | Admit: 2022-01-15 | Discharge: 2022-01-20 | Disposition: A | Discharge status: Home / Self Care | Payer: Medicaid Other | Attending: Emergency Medicine | Admitting: Emergency Medicine

## 2022-01-15 DIAGNOSIS — J101 Influenza due to other identified influenza virus with other respiratory manifestations: Secondary | ICD-10-CM | POA: Insufficient documentation

## 2022-01-15 DIAGNOSIS — F1910 Other psychoactive substance abuse, uncomplicated: Secondary | ICD-10-CM | POA: Diagnosis not present

## 2022-01-15 DIAGNOSIS — R45851 Suicidal ideations: Secondary | ICD-10-CM | POA: Insufficient documentation

## 2022-01-15 DIAGNOSIS — Z1152 Encounter for screening for COVID-19: Secondary | ICD-10-CM | POA: Diagnosis not present

## 2022-01-15 DIAGNOSIS — F339 Major depressive disorder, recurrent, unspecified: Secondary | ICD-10-CM | POA: Diagnosis not present

## 2022-01-15 DIAGNOSIS — F17210 Nicotine dependence, cigarettes, uncomplicated: Secondary | ICD-10-CM | POA: Insufficient documentation

## 2022-01-15 DIAGNOSIS — I4510 Unspecified right bundle-branch block: Secondary | ICD-10-CM | POA: Insufficient documentation

## 2022-01-15 LAB — CBC WITH DIFFERENTIAL/PLATELET
Abs Immature Granulocytes: 0.06 10*3/uL (ref 0.00–0.07)
Basophils Absolute: 0.1 10*3/uL (ref 0.0–0.1)
Basophils Relative: 1 %
Eosinophils Absolute: 0 10*3/uL (ref 0.0–0.5)
Eosinophils Relative: 0 %
HCT: 36.4 % — ABNORMAL LOW (ref 39.0–52.0)
Hemoglobin: 12.1 g/dL — ABNORMAL LOW (ref 13.0–17.0)
Immature Granulocytes: 1 %
Lymphocytes Relative: 11 %
Lymphs Abs: 1 10*3/uL (ref 0.7–4.0)
MCH: 28.7 pg (ref 26.0–34.0)
MCHC: 33.2 g/dL (ref 30.0–36.0)
MCV: 86.5 fL (ref 80.0–100.0)
Monocytes Absolute: 0.7 10*3/uL (ref 0.1–1.0)
Monocytes Relative: 8 %
Neutro Abs: 7.2 10*3/uL (ref 1.7–7.7)
Neutrophils Relative %: 79 %
Platelets: 217 10*3/uL (ref 150–400)
RBC: 4.21 MIL/uL — ABNORMAL LOW (ref 4.22–5.81)
RDW: 12.9 % (ref 11.5–15.5)
WBC: 9.1 10*3/uL (ref 4.0–10.5)
nRBC: 0 % (ref 0.0–0.2)

## 2022-01-15 LAB — COMPREHENSIVE METABOLIC PANEL
ALT: 20 U/L (ref 0–44)
AST: 25 U/L (ref 15–41)
Albumin: 3.5 g/dL (ref 3.5–5.0)
Alkaline Phosphatase: 58 U/L (ref 38–126)
Anion gap: 9 (ref 5–15)
BUN: 17 mg/dL (ref 6–20)
CO2: 21 mmol/L — ABNORMAL LOW (ref 22–32)
Calcium: 8.7 mg/dL — ABNORMAL LOW (ref 8.9–10.3)
Chloride: 104 mmol/L (ref 98–111)
Creatinine, Ser: 0.98 mg/dL (ref 0.61–1.24)
GFR, Estimated: >60 mL/min (ref >60)
Glucose, Bld: 109 mg/dL — ABNORMAL HIGH (ref 70–99)
Potassium: 3.8 mmol/L (ref 3.5–5.1)
Sodium: 134 mmol/L — ABNORMAL LOW (ref 135–145)
Total Bilirubin: 0.4 mg/dL (ref 0.3–1.2)
Total Protein: 6.7 g/dL (ref 6.5–8.1)

## 2022-01-15 LAB — RESP PANEL BY RT-PCR (RSV, FLU A&B, COVID)  RVPGX2
Influenza A by PCR: POSITIVE — AB
Influenza B by PCR: NEGATIVE
Resp Syncytial Virus by PCR: NEGATIVE
SARS Coronavirus 2 by RT PCR: NEGATIVE

## 2022-01-15 LAB — ETHANOL: Alcohol, Ethyl (B): <10 mg/dL (ref <10)

## 2022-01-15 MED ORDER — CLONIDINE HCL 0.2 MG PO TABS
0.1000 mg | ORAL_TABLET | Freq: Two times a day (BID) | ORAL | Status: AC
  Administered 2022-01-17 – 2022-01-19 (×4): 0.1 mg via ORAL
  Filled 2022-01-15 (×4): qty 1

## 2022-01-15 MED ORDER — DICYCLOMINE HCL 20 MG PO TABS
20.0000 mg | ORAL_TABLET | Freq: Four times a day (QID) | ORAL | Status: DC | PRN
  Administered 2022-01-16 – 2022-01-19 (×3): 20 mg via ORAL
  Filled 2022-01-15 (×3): qty 1

## 2022-01-15 MED ORDER — LORAZEPAM 1 MG PO TABS
1.0000 mg | ORAL_TABLET | Freq: Four times a day (QID) | ORAL | Status: DC | PRN
  Administered 2022-01-15 – 2022-01-18 (×5): 1 mg via ORAL
  Filled 2022-01-15 (×5): qty 1

## 2022-01-15 MED ORDER — ONDANSETRON HCL 4 MG/2ML IJ SOLN: 4.0000 mg | Freq: Once | INTRAMUSCULAR | Status: DC

## 2022-01-15 MED ORDER — METHOCARBAMOL 500 MG PO TABS
500.0000 mg | ORAL_TABLET | Freq: Three times a day (TID) | ORAL | Status: DC | PRN
  Administered 2022-01-16 – 2022-01-20 (×4): 500 mg via ORAL
  Filled 2022-01-15 (×5): qty 1

## 2022-01-15 MED ORDER — CLONIDINE HCL 0.2 MG PO TABS
0.1000 mg | ORAL_TABLET | Freq: Every day | ORAL | Status: AC
  Administered 2022-01-15 – 2022-01-20 (×2): 0.1 mg via ORAL
  Filled 2022-01-15: qty 1

## 2022-01-15 MED ORDER — NAPROXEN 250 MG PO TABS
500.0000 mg | ORAL_TABLET | Freq: Two times a day (BID) | ORAL | Status: DC | PRN
  Administered 2022-01-16 – 2022-01-19 (×5): 500 mg via ORAL
  Filled 2022-01-15 (×6): qty 2

## 2022-01-15 MED ORDER — HYDROXYZINE HCL 25 MG PO TABS
25.0000 mg | ORAL_TABLET | Freq: Four times a day (QID) | ORAL | Status: DC | PRN
  Administered 2022-01-17: 25 mg via ORAL
  Filled 2022-01-15: qty 1

## 2022-01-15 MED ORDER — LOPERAMIDE HCL 2 MG PO CAPS: 2.0000 mg | ORAL_CAPSULE | ORAL | Status: DC | PRN

## 2022-01-15 MED ORDER — ONDANSETRON 4 MG PO TBDP
4.0000 mg | ORAL_TABLET | Freq: Four times a day (QID) | ORAL | Status: DC | PRN
  Administered 2022-01-16 – 2022-01-18 (×5): 4 mg via ORAL
  Filled 2022-01-15 (×6): qty 1

## 2022-01-15 MED ORDER — NICOTINE 14 MG/24HR TD PT24
14.0000 mg | MEDICATED_PATCH | Freq: Every day | TRANSDERMAL | Status: DC
  Administered 2022-01-15 – 2022-01-20 (×6): 14 mg via TRANSDERMAL
  Filled 2022-01-15 (×6): qty 1

## 2022-01-15 MED ORDER — CLONIDINE HCL 0.2 MG PO TABS
0.1000 mg | ORAL_TABLET | Freq: Four times a day (QID) | ORAL | Status: AC
  Administered 2022-01-16 – 2022-01-17 (×6): 0.1 mg via ORAL
  Filled 2022-01-15 (×7): qty 1

## 2022-01-15 MED ORDER — ONDANSETRON 4 MG PO TBDP
ORAL_TABLET | ORAL | Status: AC
  Administered 2022-01-15: 4 mg
  Filled 2022-01-15: qty 1

## 2022-01-15 NOTE — ED Notes (Signed)
ED Provider at bedside. 

## 2022-01-15 NOTE — ED Provider Triage Note (Signed)
Emergency Medicine Provider Triage Evaluation Note  SAED HUDLOW , a 51 y.o. male  was evaluated in triage.  Pt complains of suicidal ideation and concern for overdose.  Patient reports associated nausea, vomiting, fatigue.  He denies any loss of consciousness, abdominal pain, headaches, fever.  Patient is not particularly cooperative and answering questions but he is alert but appears to be somewhat disoriented.  Denies homicidal ideation or auditory/visual hallucinations.  Review of Systems  Positive: As above Negative: As above  Physical Exam  BP 129/77 (BP Location: Left Arm)   Pulse 77   Temp 98.6 F (37 C) (Oral)   Resp 16   Ht 6' (1.829 m)   Wt 81.6 kg   SpO2 100%   BMI 24.41 kg/m  Gen:   Awake, no distress, vomiting Resp:  Normal effort MSK:   Moves extremities without difficulty Other:    Medical Decision Making  Medically screening exam initiated at 7:10 PM.  Appropriate orders placed.  FARRELL PANTALEO was informed that the remainder of the evaluation will be completed by another provider, this initial triage assessment does not replace that evaluation, and the importance of remaining in the ED until their evaluation is complete.     Smitty Knudsen, PA-C 01/15/22 1954
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
## 2022-01-15 NOTE — ED Triage Notes (Addendum)
Pt addicted to opiates, took suboxone, did not help his nausea/vomiting, pt took something he thinks may have been laced w/fentanyl, having increased nausea.    Pt ambulatory to triage room. Pt states he took the fentanyl at 1700 and it made him feel like he was overdosing. Pt c/o nausea and feeling shaky/jerky.  Pt disoriented to time, oriented to person, place and situation. Pt alert when spoken too. Pt also c/o sore throat and cough. Pt also c/o SI, denies HI/AVH.  EMS VS 150/90 HR=88

## 2022-01-15 NOTE — ED Provider Notes (Addendum)
[facility] EMERGENCY DEPARTMENT Provider Note   CSN: [id number] Arrival date & time: 01/15/22  1853     History  Chief Complaint  Patient presents with   Medication Reaction   Suicidal    Nicholas [name] is a 51 y.o. male.  Patient here stating that he is suicidal not very verbal about a specific plan.  States that he has had opiate substance abuse and he is done with that he took some fentanyl around 1700 made him feel like he was overdosing.  But complaining of nausea and feeling shaky and jerky.  Denies any visual or auditory hallucinations.  Patient was seen by behavioral health in July for substance abuse problems.  Past medical history significant chronic back pain opiate addiction and alcohol abuse.  Patient does state that he was drinking some alcohol as well.  Patient is an everyday smoker.       Home Medications Prior to Admission medications   Medication Sig Start Date End Date Taking? Authorizing Provider  ondansetron (ZOFRAN-ODT) 4 MG disintegrating tablet Dissolve 1 tablet (4 mg total) by mouth every 8 (eight) hours as needed for nausea or vomiting. 08/14/21   Smoot, Shawn Route, PA-C  OVER THE COUNTER MEDICATION Take 1 tablet by mouth daily. Vitamin B    [provider]  sulfamethoxazole-trimethoprim (BACTRIM DS) 800-160 MG tablet Take 1 tablet by mouth 2 (two) times daily. Take for 7 days starting on 08/21/21.    [provider]      Allergies    Patient has no known allergies.    Review of Systems   Review of Systems  Constitutional:  Negative for chills and fever.  HENT:  Negative for ear pain and sore throat.   Eyes:  Negative for pain and visual disturbance.  Respiratory:  Negative for cough and shortness of breath.   Cardiovascular:  Negative for chest pain and palpitations.  Gastrointestinal:  Negative for abdominal pain and vomiting.  Genitourinary:  Negative for dysuria and hematuria.  Musculoskeletal:  Negative for  arthralgias and back pain.  Skin:  Negative for color change and rash.  Neurological:  Negative for seizures and syncope.  Psychiatric/Behavioral:  Positive for suicidal ideas. The patient is nervous/anxious.   All other systems reviewed and are negative.   Physical Exam Updated Vital Signs BP 129/77 (BP Location: Left Arm)   Pulse 77   Temp 98.6 F (37 C) (Oral)   Resp 16   Ht 1.829 m (6')   Wt 81.6 kg   SpO2 100%   BMI 24.41 kg/m  Physical Exam Vitals and nursing note reviewed.  Constitutional:      General: He is not in acute distress.    Appearance: Normal appearance. He is well-developed.  HENT:     Head: Normocephalic and atraumatic.     Mouth/Throat:     Mouth: Mucous membranes are moist.  Eyes:     Extraocular Movements: Extraocular movements intact.     Conjunctiva/sclera: Conjunctivae normal.     Pupils: Pupils are equal, round, and reactive to light.  Cardiovascular:     Rate and Rhythm: Normal rate and regular rhythm.     Heart sounds: No murmur heard. Pulmonary:     Effort: Pulmonary effort is normal. No respiratory distress.     Breath sounds: Normal breath sounds.  Abdominal:     Palpations: Abdomen is soft.     Tenderness: There is no abdominal tenderness.  Musculoskeletal:  General: No swelling.     Cervical back: Normal range of motion and neck supple.  Skin:    General: Skin is warm and dry.     Capillary Refill: Capillary refill takes less than 2 seconds.  Neurological:     General: No focal deficit present.     Mental Status: He is alert and oriented to person, place, and time.     Cranial Nerves: No cranial nerve deficit.     Sensory: No sensory deficit.     Motor: No weakness.     ED Results / Procedures / Treatments   Labs (all labs ordered are listed, but only abnormal results are displayed) Labs Reviewed  CBC WITH DIFFERENTIAL/PLATELET - Abnormal; Notable for the following components:      Result Value   RBC 4.21 (*)     Hemoglobin 12.1 (*)    HCT 36.4 (*)    All other components within normal limits  COMPREHENSIVE METABOLIC PANEL - Abnormal; Notable for the following components:   Sodium 134 (*)    CO2 21 (*)    Glucose, Bld 109 (*)    Calcium 8.7 (*)    All other components within normal limits  RESP PANEL BY RT-PCR (RSV, FLU A&B, COVID)  [id number]  ETHANOL  RAPID URINE DRUG SCREEN, HOSP PERFORMED    EKG None  Radiology No results found.  Procedures Procedures    Medications Ordered in ED Medications  LORazepam (ATIVAN) tablet 1 mg (has no administration in time range)  cloNIDine (CATAPRES) tablet 0.1 mg (has no administration in time range)    Followed by  cloNIDine (CATAPRES) tablet 0.1 mg (has no administration in time range)    Followed by  cloNIDine (CATAPRES) tablet 0.1 mg (has no administration in time range)  dicyclomine (BENTYL) tablet 20 mg (has no administration in time range)  hydrOXYzine (ATARAX) tablet 25 mg (has no administration in time range)  loperamide (IMODIUM) capsule 2-4 mg (has no administration in time range)  methocarbamol (ROBAXIN) tablet 500 mg (has no administration in time range)  naproxen (NAPROSYN) tablet 500 mg (has no administration in time range)  ondansetron (ZOFRAN-ODT) disintegrating tablet 4 mg (has no administration in time range)  nicotine (NICODERM CQ - dosed in mg/24 hours) patch 14 mg (has no administration in time range)  ondansetron (ZOFRAN-ODT) 4 MG disintegrating tablet (4 mg  Given 01/15/22 1910)    ED Course/ Medical Decision Making/ A&P                           Medical Decision Making Risk OTC drugs. Prescription drug management.   Patient CBC white blood cell count 9.1 normal.  Hemoglobin 12.1.  Complete metabolic panel sodium [id number] CO2 21 glucose good at 109 potassium normal 3.8 normal renal function anion gap normal at 9 alcohol less than 10.  And patient does not appear to be in any withdrawal at this point in time.  COVID  influenza testing is pending.  Urine drug screen is pending.  Will give patient some Ativan and put him on the opiate withdrawal protocol.  At this point in time does not require medical admission.  TTS consult placed.  In addition do not feel patient needs to be IVC at this point in time.  Patient is expressing suicidal ideation.  But no specific plan.  Says he would stay voluntarily.  Because he wants help.  At this point in time patient seems to be  medically cleared.  Patient continues to express suicidal ideation.  EKG without any acute abnormalities.  Patient medically cleared.  For evaluation by behavioral health.   Final Clinical Impression(s) / ED Diagnoses Final diagnoses:  Suicidal ideation  Substance abuse [facility])    Rx / DC Orders ED Discharge Orders     None         Fredia Sorrow, MD 01/15/22 2127    Fredia Sorrow, MD 01/15/22 2129    Fredia Sorrow, MD 01/15/22 2213

## 2022-01-16 DIAGNOSIS — R45851 Suicidal ideations: Secondary | ICD-10-CM

## 2022-01-16 MED ORDER — GUAIFENESIN 100 MG/5ML PO LIQD
5.0000 mL | Freq: Once | ORAL | Status: AC
  Administered 2022-01-16: 5 mL via ORAL
  Filled 2022-01-16: qty 5

## 2022-01-16 MED ORDER — OSELTAMIVIR PHOSPHATE 75 MG PO CAPS
75.0000 mg | ORAL_CAPSULE | Freq: Once | ORAL | Status: AC
  Administered 2022-01-16: 75 mg via ORAL
  Filled 2022-01-16: qty 1

## 2022-01-16 NOTE — ED Notes (Signed)
Pt on droplet precautions.

## 2022-01-16 NOTE — Progress Notes (Signed)
TOC CSW has provided pt substance use resources via AVS, due to substance use.   Nicholas [name], MSW, LCSW-A Pronouns:  She/Her/Hers [facility] Clinical Social Worker Direct Number:  [phone] [name]

## 2022-01-16 NOTE — BH Assessment (Addendum)
Disposition:   @2205  As per , the patient can be admitted to [facility] on January 20, 2022, at any time after 8 a.m. Due to the + Flu results on January 15, 2022, the facility's acceptance date was extended. Dr. January 17, 2022 is the accepting provider. Nurse report [phone] (pager #).   The EDP (Dr. #[phone]) and patient's nurse Dutch Quint, RN) provided disposition updates.

## 2022-01-16 NOTE — ED Notes (Signed)
TTS in process 

## 2022-01-16 NOTE — ED Notes (Signed)
Pr reports he does not have any nausea.

## 2022-01-16 NOTE — Consult Note (Signed)
[facility] ED ASSESSMENT   Reason for Consult:  SI Referring Physician:  Dr. Deretha Emory Patient Identification: Nicholas [name] MRN:  [id number] ED Chief Complaint: Suicidal ideation  Diagnosis:  Principal Problem:   Suicidal ideation Active Problems:   Polysubstance abuse [facility])   ED Assessment Time Calculation: Start Time: 1230 Stop Time: 1300 Total Time in Minutes (Assessment Completion): 30   HPI:   Nicholas [name] is a 51 y.o. male patient who presented to [facility] with passive suicidal ideations and polysubstance abuse. Pt reportedly using fentanyl and heroine regularly. Ultimately reports hopelessness, requesting psychiatric help/evaluation.   Subjective:   Patient seen at [name], ED for face-to-face evaluation.  Patient tells me that he has been homeless and using various substances for over 20 years.  He tells me he has been drinking alcohol daily for around 20 years.  Patient has had withdrawal seizures before, most recently around 5 years ago.  Patient's longest period of sobriety less than 6 months.  Patient reports being in different rehabs and substance use treatment facilities before but always seems to relapse.  Patient stated "I'm just tired of being sick and tired. I'm tired of the drug use, tired of being homeless, and tired of not having a life. I want to be done and I do want to kill myself." He continues to deny any specific plan or intent. Pt stated "I don't know if I'm depressed or if it's from the drugs." Pt denies any previous formal psychiatric history other than substance abuse. He denies homicidal ideations. Denies auditory or visual hallucinations. He denies any family or friend support. Pt reports feeling isolated, low self esteem, fluctuating appetite, decreased energy and motivation. Pt currently homeless, not staying at any shelters and mostly stays in a tent outside. He is hoping to receive psychiatric treatment and hopefully a residential substance abuse tx facility.    We briefly spoke about possibly starting on an antidepressant. However, he declined stating his mother was previously on Prozac and it "messed her up". He is wanting to not start psychotropic medications if possible. Pt is unable to contract for safety at this time. Will recommend inpatient psychiatric admission. However there are some barriers to treatment as patient did test positive for influenza. Waiting to here back from CSW about different inpatient units protocol/if isolation is needed prior to admission.    Past Psychiatric History:  Polysubstance abuse, homelessness  Risk to Self or Others: Is the patient at risk to self? Yes Has the patient been a risk to self in the past 6 months? No Has the patient been a risk to self within the distant past? No Is the patient a risk to others? No Has the patient been a risk to others in the past 6 months? No Has the patient been a risk to others within the distant past? No  [place] Scale:  Flowsheet Row ED from 01/15/2022 in [facility] EMERGENCY DEPARTMENT ED from 08/14/2021 in [facility]GENCY DEPT  C-SSRS RISK CATEGORY Moderate Risk No Risk       AIMS:  , , ,  ,   ASAM: ASAM Multidimensional Assessment Summary Dimension 1:  Description of individual's past and current experiences of substance use and withdrawal: persistant daily drug usage Dimension 2:  Description of patient's biomedical conditions and  complications: poor coping skills Dimension 2:  Biomedical Conditions and Complications Severity Rating: Severe Dimension 3:  Description of emotional, behavioral, or cognitive conditions and complications: worsening depression and  hopelessness Dimension 3:  Emotional, behavioral or cognitive (EBC) conditions and complications severity rating: Severe Dimension 4:  Description of Readiness to Change criteria: patient requesting treatment Dimension 4:  Readiness to Change Severity Rating:  Moderate Dimension 5:  Relapse, continued use, or continued problem potential critiera description: Continued use Dimension 5:  Relapse, continued use, or continued problem potential severity rating: Moderate Dimension 6:  Recovery/Iiving environment criteria description: patient has been homeless for 2 years Dimension 6:  Recovery/living environment severity rating: Moderate ASAM's Severity Rating Score: 12  Substance Abuse:  Alcohol / Drug Use Pain Medications: see MAR Prescriptions: see MAR Over the Counter: see MAR History of alcohol / drug use?: Yes Longest period of sobriety (when/how long): uta Negative Consequences of Use: Financial, Personal relationships Withdrawal Symptoms: Tremors  Past Medical History:  Past Medical History:  Diagnosis Date   Chronic back pain    ETOH abuse    Opiate addiction (HCC)    History reviewed. No pertinent surgical history. Family History:  Family History  Problem Relation Age of Onset   Alcoholism Father    Testicular cancer Father    Heart Problems Father    Hernia Father    Intracerebral hemorrhage Maternal Grandmother    Social History:  Social History   Substance and Sexual Activity  Alcohol Use Yes     Social History   Substance and Sexual Activity  Drug Use Yes   Comment: heroin    Social History   Socioeconomic History   Marital status: Single    Spouse name: Not on file   Number of children: Not on file   Years of education: Not on file   Highest education level: Not on file  Occupational History   Not on file  Tobacco Use   Smoking status: Every Day    Packs/day: 2.00    Types: Cigarettes   Smokeless tobacco: Never  Vaping Use   Vaping Use: Never used  Substance and Sexual Activity   Alcohol use: Yes   Drug use: Yes    Comment: heroin   Sexual activity: Not on file  Other Topics Concern   Not on file  Social History Narrative   Not on file   Social Determinants of Health   Financial Resource  Strain: Not on file  Food Insecurity: Not on file  Transportation Needs: Not on file  Physical Activity: Not on file  Stress: Not on file  Social Connections: Not on file   Allergies:  No Known Allergies  Labs:  Results for orders placed or performed during the hospital encounter of 01/15/22 (from the past 48 hour(s))  Resp panel by RT-PCR (RSV, Flu A&B, Covid) Anterior Nasal Swab     Status: Abnormal   Collection Time: 01/15/22  7:05 PM   Specimen: Anterior Nasal Swab  Result Value Ref Range   SARS Coronavirus 2 by RT PCR NEGATIVE NEGATIVE    Comment: (NOTE) SARS-CoV-2 target nucleic acids are NOT DETECTED.  The SARS-CoV-2 RNA is generally detectable in upper respiratory specimens during the acute phase of infection. The lowest concentration of SARS-CoV-2 viral copies this assay can detect is 138 copies/mL. A negative result does not preclude SARS-Cov-2 infection and should not be used as the sole basis for treatment or other patient management decisions. A negative result may occur with  improper specimen collection/handling, submission of specimen other than nasopharyngeal swab, presence of viral mutation(s) within the areas targeted by this assay, and inadequate number of viral copies(<138 copies/mL).  A negative result must be combined with clinical observations, patient history, and epidemiological information. The expected result is Negative.  Fact Sheet for Patients:  BloggerCourse.com  Fact Sheet for Healthcare Providers:  SeriousBroker.it  This test is no t yet approved or cleared by the [place] FDA and  has been authorized for detection and/or diagnosis of SARS-CoV-2 by FDA under an Emergency Use Authorization (EUA). This EUA will remain  in effect (meaning this test can be used) for the duration of the COVID-19 declaration under Section 564(b)(1) of the Act, 21 U.S.C.section 360bbb-3(b)(1), unless the  authorization is terminated  or revoked sooner.       Influenza A by PCR POSITIVE (A) NEGATIVE   Influenza B by PCR NEGATIVE NEGATIVE    Comment: (NOTE) The Xpert Xpress SARS-CoV-2/FLU/RSV plus assay is intended as an aid in the diagnosis of influenza from Nasopharyngeal swab specimens and should not be used as a sole basis for treatment. Nasal washings and aspirates are unacceptable for Xpert Xpress SARS-CoV-2/FLU/RSV testing.  Fact Sheet for Patients: BloggerCourse.com  Fact Sheet for Healthcare Providers: SeriousBroker.it  This test is not yet approved or cleared by the [place] FDA and has been authorized for detection and/or diagnosis of SARS-CoV-2 by FDA under an Emergency Use Authorization (EUA). This EUA will remain in effect (meaning this test can be used) for the duration of the COVID-19 declaration under Section 564(b)(1) of the Act, 21 U.S.C. section 360bbb-3(b)(1), unless the authorization is terminated or revoked.     Resp Syncytial Virus by PCR NEGATIVE NEGATIVE    Comment: (NOTE) Fact Sheet for Patients: BloggerCourse.com  Fact Sheet for Healthcare Providers: SeriousBroker.it  This test is not yet approved or cleared by the [place] FDA and has been authorized for detection and/or diagnosis of SARS-CoV-2 by FDA under an Emergency Use Authorization (EUA). This EUA will remain in effect (meaning this test can be used) for the duration of the COVID-19 declaration under Section 564(b)(1) of the Act, 21 U.S.C. section 360bbb-3(b)(1), unless the authorization is terminated or revoked.  Performed at [facility], [address], [place], [place] [address]   CBC with Differential     Status: Abnormal   Collection Time: 01/15/22  7:17 PM  Result Value Ref Range   WBC 9.1 4.0 - 10.5 K/uL   RBC 4.21 (L) 4.22 - 5.81 MIL/uL   Hemoglobin 12.1 (L)  13.0 - 17.0 g/dL   HCT 19.1 (L) 47.8 - 29.5 %   MCV 86.5 80.0 - 100.0 fL   MCH 28.7 26.0 - 34.0 pg   MCHC 33.2 30.0 - 36.0 g/dL   RDW 62.1 30.8 - 65.7 %   Platelets 217 150 - 400 K/uL   nRBC 0.0 0.0 - 0.2 %   Neutrophils Relative % 79 %   Neutro Abs 7.2 1.7 - 7.7 K/uL   Lymphocytes Relative 11 %   Lymphs Abs 1.0 0.7 - 4.0 K/uL   Monocytes Relative 8 %   Monocytes Absolute 0.7 0.1 - 1.0 K/uL   Eosinophils Relative 0 %   Eosinophils Absolute 0.0 0.0 - 0.5 K/uL   Basophils Relative 1 %   Basophils Absolute 0.1 0.0 - 0.1 K/uL   Immature Granulocytes 1 %   Abs Immature Granulocytes 0.06 0.00 - 0.07 K/uL    Comment: Performed at [facility], [address], [place], [place] [address]  Comprehensive metabolic panel     Status: Abnormal   Collection Time: 01/15/22  7:17  PM  Result Value Ref Range   Sodium 134 (L) 135 - 145 mmol/L   Potassium 3.8 3.5 - 5.1 mmol/L   Chloride 104 98 - 111 mmol/L   CO2 21 (L) 22 - 32 mmol/L   Glucose, Bld 109 (H) 70 - 99 mg/dL    Comment: Glucose reference range applies only to samples taken after fasting for at least 8 hours.   BUN 17 6 - 20 mg/dL   Creatinine, Ser 0.01 0.61 - 1.24 mg/dL   Calcium 8.7 (L) 8.9 - 10.3 mg/dL   Total Protein 6.7 6.5 - 8.1 g/dL   Albumin 3.5 3.5 - 5.0 g/dL   AST 25 15 - 41 U/L   ALT 20 0 - 44 U/L   Alkaline Phosphatase 58 38 - 126 U/L   Total Bilirubin 0.4 0.3 - 1.2 mg/dL   GFR, Estimated >74 >94 mL/min    Comment: (NOTE) Calculated using the CKD-EPI Creatinine Equation (2021)    Anion gap 9 5 - 15    Comment: Performed at [facility], [address] [place], [place] [address]  Ethanol     Status: None   Collection Time: 01/15/22  7:17 PM  Result Value Ref Range   Alcohol, Ethyl (B) <10 <10 mg/dL    Comment: (NOTE) Lowest detectable limit for serum alcohol is 10 mg/dL.  For medical purposes only. Performed at [facility], [address] [place], [place] [address]     Current  Facility-Administered Medications  Medication Dose Route Frequency Provider Last Rate Last Admin   cloNIDine (CATAPRES) tablet 0.1 mg  0.1 mg Oral QID Vanetta Mulders, MD   0.1 mg at 01/16/22 1140   Followed by   Melene Muller ON 01/17/2022] cloNIDine (CATAPRES) tablet 0.1 mg  0.1 mg Oral BID Vanetta Mulders, MD       Followed by   Melene Muller ON 01/20/2022] cloNIDine (CATAPRES) tablet 0.1 mg  0.1 mg Oral Daily Vanetta Mulders, MD   0.1 mg at 01/15/22 2147   dicyclomine (BENTYL) tablet 20 mg  20 mg Oral Q6H PRN Vanetta Mulders, MD   20 mg at 01/16/22 0448   hydrOXYzine (ATARAX) tablet 25 mg  25 mg Oral Q6H PRN Vanetta Mulders, MD       loperamide (IMODIUM) capsule 2-4 mg  2-4 mg Oral PRN Vanetta Mulders, MD       LORazepam (ATIVAN) tablet 1 mg  1 mg Oral Q6H PRN Vanetta Mulders, MD   1 mg at 01/15/22 2145   methocarbamol (ROBAXIN) tablet 500 mg  500 mg Oral Q8H PRN Vanetta Mulders, MD       naproxen (NAPROSYN) tablet 500 mg  500 mg Oral BID PRN Vanetta Mulders, MD       nicotine (NICODERM CQ - dosed in mg/24 hours) patch 14 mg  14 mg Transdermal Daily Vanetta Mulders, MD   14 mg at 01/16/22 1117   ondansetron (ZOFRAN-ODT) disintegrating tablet 4 mg  4 mg Oral Q6H PRN Vanetta Mulders, MD   4 mg at 01/16/22 1109   Current Outpatient Medications  Medication Sig Dispense Refill   buprenorphine-naloxone (SUBOXONE) 8-2 mg SUBL SL tablet Place 1 tablet under the tongue every 6 (six) hours as needed (withdrawals).     cloNIDine (CATAPRES) 0.1 MG tablet Take 0.1 mg by mouth 2 (two) times daily.     ibuprofen (ADVIL) 800 MG tablet Take 800 mg by mouth every 6 (six) hours as needed for mild pain or moderate pain.  ondansetron (ZOFRAN-ODT) 4 MG disintegrating tablet Dissolve 1 tablet (4 mg total) by mouth every 8 (eight) hours as needed for nausea or vomiting. 20 tablet 0    Psychiatric Specialty Exam: Presentation  General Appearance:  Appropriate for Environment  Eye  Contact: Good  Speech: Clear and Coherent  Speech Volume: Normal  Handedness: Right   Mood and Affect  Mood: Depressed; Hopeless  Affect: Congruent   Thought Process  Thought Processes: Coherent  Descriptions of Associations:Intact  Orientation:Full (Time, Place and Person)  Thought Content:Logical  History of Schizophrenia/Schizoaffective disorder:No  Duration of Psychotic Symptoms:No data recorded Hallucinations:Hallucinations: None  Ideas of Reference:None  Suicidal Thoughts:Suicidal Thoughts: Yes, Passive SI Passive Intent and/or Plan: Without Intent; Without Plan  Homicidal Thoughts:Homicidal Thoughts: No   Sensorium  Memory: Immediate Fair; Recent Good  Judgment: Fair  Insight: Fair   Executive Functions  Concentration: Good  Attention Span: Good  Recall: Good  Fund of Knowledge: Good  Language: Good   Psychomotor Activity  Psychomotor Activity: Psychomotor Activity: Normal   Assets  Assets: Desire for Improvement; Physical Health; Resilience    Sleep  Sleep: Sleep: Fair   Physical Exam: Physical Exam Neurological:     Mental Status: He is alert and oriented to person, place, and time.  Psychiatric:        Attention and Perception: Attention normal.        Mood and Affect: Mood is depressed. Affect is tearful.        Speech: Speech normal.        Behavior: Behavior is cooperative.        Thought Content: Thought content includes suicidal ideation.    Review of Systems  Psychiatric/Behavioral:  Positive for depression, substance abuse and suicidal ideas. The patient is nervous/anxious.   All other systems reviewed and are negative.  Blood pressure 123/79, pulse 80, temperature 99 F (37.2 C), resp. rate 18, height 6' (1.829 m), weight 81.6 kg, SpO2 95 %. Body mass index is 24.41 kg/m.  Medical Decision Making: Pt case reviewed and discussed with Dr. Viviano Simas. Will continue to recommend inpatient  psychiatric treatment. CSW has been notified to fax out. EDP, RN, and LCSW notified.   - patient declined psychotropic medication at this time  Disposition: Recommend psychiatric Inpatient admission when medically cleared.  Eligha Bridegroom, NP 01/16/2022 2:10 PM

## 2022-01-16 NOTE — ED Notes (Signed)
PT reports he has vomited. PRN given

## 2022-01-16 NOTE — ED Provider Notes (Signed)
Emergency Medicine Observation Re-evaluation Note  Nicholas [name] is a 51 y.o. male, seen on rounds today.  Pt initially presented to the ED for complaints of Medication Reaction and Suicidal Currently, the patient is resting.  Physical Exam  BP (!) 148/93 (BP Location: Left Arm)   Pulse 65   Temp 99 F (37.2 C)   Resp 18   Ht 6' (1.829 m)   Wt 81.6 kg   SpO2 93%   BMI 24.41 kg/m  Physical Exam General: No distress Cardiac: No tachycardia Lungs: No increased work of breathing Psych: Resting  ED Course / MDM  EKG:EKG Interpretation  Date/Time:  Monday January 15 2022 21:45:15 EST Ventricular Rate:  68 PR Interval:  154 QRS Duration: 122 QT Interval:  430 QTC Calculation: 457 R Axis:   87 Text Interpretation: Normal sinus rhythm Right bundle branch block Abnormal ECG When compared with ECG of 23-Aug-2021 13:00, PREVIOUS ECG IS PRESENT NO SIGNIFICANT CHANGE SINCE LAST TRACING YESTERDAY Confirmed by Vanetta Mulders [phone]) on 01/15/2022 10:12:47 PM  I have reviewed the labs performed to date as well as medications administered while in observation.  Recent changes in the last 24 hours include influenza positive.  Plan  Current plan is for psych placement.    Gerhard Munch, MD 01/16/22 1121

## 2022-01-16 NOTE — ED Notes (Signed)
Droplet precaution sign on door.

## 2022-01-16 NOTE — ED Notes (Signed)
Patient woke up and ambulated to the bathroom w/o assistance and when her returned to the room he begin to vomit several times; pt was given Bentyl and zofran; pt c/o of abdominal cramping; Pt is a&ox 4 and vitals WNL-Monique,RN

## 2022-01-16 NOTE — ED Notes (Addendum)
Disposition: @2205  As per , the patient can be admitted to [facility] on January 20, 2022, at any time after 8 a.m. Due to the + Flu results on January 15, 2022, the facility's acceptance date was extended. Dr. January 17, 2022 is the accepting provider. Nurse report [phone] (pager #).  PER TOKYA PERRY COUNSELOR

## 2022-01-17 ENCOUNTER — Emergency Department (HOSPITAL_COMMUNITY): Payer: Medicaid Other

## 2022-01-17 LAB — RAPID URINE DRUG SCREEN, HOSP PERFORMED
Amphetamines: NOT DETECTED
Barbiturates: NOT DETECTED
Benzodiazepines: NOT DETECTED
Cocaine: POSITIVE — AB
Opiates: NOT DETECTED
Tetrahydrocannabinol: NOT DETECTED

## 2022-01-17 MED ORDER — IPRATROPIUM-ALBUTEROL 0.5-2.5 (3) MG/3ML IN SOLN
3.0000 mL | Freq: Once | RESPIRATORY_TRACT | Status: AC
  Administered 2022-01-17: 3 mL via RESPIRATORY_TRACT
  Filled 2022-01-17: qty 3

## 2022-01-17 MED ORDER — HYDROXYZINE HCL 50 MG PO TABS
50.0000 mg | ORAL_TABLET | Freq: Four times a day (QID) | ORAL | Status: DC | PRN
  Administered 2022-01-18 – 2022-01-20 (×3): 50 mg via ORAL
  Filled 2022-01-17 (×3): qty 1

## 2022-01-17 MED ORDER — TRAZODONE HCL 100 MG PO TABS
100.0000 mg | ORAL_TABLET | Freq: Every evening | ORAL | Status: DC | PRN
  Administered 2022-01-17 – 2022-01-19 (×3): 100 mg via ORAL
  Filled 2022-01-17 (×3): qty 1

## 2022-01-17 NOTE — ED Provider Notes (Signed)
Emergency Medicine Observation Re-evaluation Note  Nicholas [name] is a 51 y.o. male, seen on rounds today.  Pt initially presented to the ED for complaints of Medication Reaction and Suicidal Currently, the patient is sleeping.  Physical Exam  BP 111/83 (BP Location: Left Arm)   Pulse 64   Temp 98 F (36.7 C) (Oral)   Resp 16   Ht 1.829 m (6')   Wt 81.6 kg   SpO2 95%   BMI 24.41 kg/m  Physical Exam General: NAD Cardiac: normal rate Lungs: normal effort Psych:   ED Course / MDM  EKG:EKG Interpretation  Date/Time:  Monday January 15 2022 21:45:15 EST Ventricular Rate:  68 PR Interval:  154 QRS Duration: 122 QT Interval:  430 QTC Calculation: 457 R Axis:   87 Text Interpretation: Normal sinus rhythm Right bundle branch block Abnormal ECG When compared with ECG of 23-Aug-2021 13:00, PREVIOUS ECG IS PRESENT NO SIGNIFICANT CHANGE SINCE LAST TRACING YESTERDAY Confirmed by Vanetta Mulders [phone]) on 01/15/2022 10:12:47 PM  I have reviewed the labs performed to date as well as medications administered while in observation.  Recent changes in the last 24 hours include no acute events..  Plan  Current plan is for inpatient treatment.  Admit to [facility] on Dec 23rd due to positive influenza    Linwood Dibbles, MD 01/17/22 [phone]

## 2022-01-17 NOTE — ED Notes (Signed)
Spoke with son Zayde Stroupe at this time with approval from patient to give out his information. Updated son on plan of care and what the patients current status is.

## 2022-01-17 NOTE — ED Notes (Signed)
Pt reports to this RN that he is feeling SOB. Assessed: NAD, no dyspnea noted, breath sounds diminished bilaterally with crackles noted in R lung base. No wheeze noted. Pt states that he does not use a daily inhaler but was once told at an urgent care that he has COPD. EDP notified; CXR and duoneb ordered (see MAR).

## 2022-01-17 NOTE — BH Specialist Note (Signed)
Nicholas [name], 51 year old, male, unable to evaluate during rounds. Patient has disposition of acceptance to [facility] 01/20/2022 due to (received message from RN [place]) patient would like to start Fluoxetine. Apparently a different provider offered this as a treatment option yesterday, but patient declined. Patient reported to RN he that his 'mind was racing ' ativan and hydroxyzine was not helpful. Increased Hydroxyzine 50 mg TID and added Trazodone to help with sleep. Patient  will be seen during rounds in AM.

## 2022-01-17 NOTE — ED Notes (Signed)
Patient reports he feels much better.

## 2022-01-17 NOTE — ED Notes (Signed)
Patient reports some anxiety, feeling really hot and some nausea. Patient given some ativan and odt zofran to help and rechecked temp, 98.0 at this time.

## 2022-01-17 NOTE — ED Notes (Signed)
Yellow colored chain removed from around patient's neck, placed inside belongings bag already in locker. Black colored necklace with cross on it remains on patient, pt refuses to have it cut off d/t religious reasons. Sitter aware of necklace.

## 2022-01-18 DIAGNOSIS — F339 Major depressive disorder, recurrent, unspecified: Secondary | ICD-10-CM | POA: Diagnosis not present

## 2022-01-18 DIAGNOSIS — R45851 Suicidal ideations: Secondary | ICD-10-CM

## 2022-01-18 DIAGNOSIS — F1910 Other psychoactive substance abuse, uncomplicated: Secondary | ICD-10-CM | POA: Diagnosis not present

## 2022-01-18 MED ORDER — DIPHENHYDRAMINE HCL 25 MG PO CAPS
25.0000 mg | ORAL_CAPSULE | ORAL | Status: AC
  Administered 2022-01-18: 25 mg via ORAL
  Filled 2022-01-18: qty 1

## 2022-01-18 MED ORDER — LORAZEPAM 2 MG/ML IJ SOLN
2.0000 mg | Freq: Once | INTRAMUSCULAR | Status: AC
  Administered 2022-01-18: 2 mg via INTRAMUSCULAR
  Filled 2022-01-18: qty 1

## 2022-01-18 MED ORDER — ONDANSETRON 4 MG PO TBDP
4.0000 mg | ORAL_TABLET | Freq: Once | ORAL | Status: AC
  Administered 2022-01-18: 4 mg via ORAL
  Filled 2022-01-18: qty 1

## 2022-01-18 MED ORDER — FLUOXETINE HCL 20 MG PO CAPS
20.0000 mg | ORAL_CAPSULE | Freq: Every day | ORAL | Status: DC
  Administered 2022-01-18 – 2022-01-20 (×3): 20 mg via ORAL
  Filled 2022-01-18 (×3): qty 1

## 2022-01-18 MED ORDER — METOCLOPRAMIDE HCL 10 MG PO TABS
10.0000 mg | ORAL_TABLET | ORAL | Status: AC
  Administered 2022-01-18: 10 mg via ORAL
  Filled 2022-01-18 (×2): qty 1

## 2022-01-18 MED ORDER — DROPERIDOL 2.5 MG/ML IJ SOLN
2.5000 mg | Freq: Once | INTRAMUSCULAR | Status: AC
  Administered 2022-01-18: 2.5 mg via INTRAMUSCULAR
  Filled 2022-01-18: qty 2

## 2022-01-18 NOTE — ED Notes (Addendum)
Pt's belongings bag and cell phone sent home with his sister-in-law, Corrie Dandy.  Pt specifically asking about his wallet, pt's wallet was in the belongings bag and sent home with sister in law.

## 2022-01-18 NOTE — ED Notes (Signed)
Pt c/o nausea/vomiting, sore throat and unable to keep anything down. Provider made aware.

## 2022-01-18 NOTE — ED Notes (Signed)
Pt is requesting medication to help him sleep. Reminded pt that he has medication ordered at bedtime but it is too soon to administer at this time.  Pt reports anxiety, PRN ativan administered at this time.  See EMAR.

## 2022-01-18 NOTE — ED Provider Notes (Signed)
Emergency Medicine Observation Re-evaluation Note  Nicholas [name] is a 51 y.o. male, seen on rounds today.  Pt initially presented to the ED for complaints of Medication Reaction and Suicidal Currently, the patient is sleeping. Patient complained to nurse about "feeling withdrawals" but now asleep.  Physical Exam  BP 117/84 (BP Location: Right Arm)   Pulse 100   Temp 98 F (36.7 C)   Resp 19   Ht 6' (1.829 m)   Wt 81.6 kg   SpO2 99%   BMI 24.41 kg/m  Physical Exam General: NAD Cardiac: normal rate Lungs: normal effort Psych: asleep  ED Course / MDM  EKG:EKG Interpretation  Date/Time:  Monday January 15 2022 21:45:15 EST Ventricular Rate:  68 PR Interval:  154 QRS Duration: 122 QT Interval:  430 QTC Calculation: 457 R Axis:   87 Text Interpretation: Normal sinus rhythm Right bundle branch block Abnormal ECG When compared with ECG of 23-Aug-2021 13:00, PREVIOUS ECG IS PRESENT NO SIGNIFICANT CHANGE SINCE LAST TRACING YESTERDAY Confirmed by Vanetta Mulders [phone]) on 01/15/2022 10:12:47 PM  I have reviewed the labs performed to date as well as medications administered while in observation.  Recent changes in the last 24 hours include no acute events.  Plan  Current plan is for inpatient treatment.  Admit to [facility] on Dec 23rd due to positive influenza      Lonell Grandchild, MD 01/18/22 [phone]

## 2022-01-18 NOTE — ED Notes (Signed)
Pt had witnessed fall in the shower, and slipped; falling to his knee. The sitter was present when fall occurred. Pt did not hit head. Pt states "I am just weak from not eating or sleeping". No obvious signs of injury noted. Provider will be made aware.

## 2022-01-18 NOTE — ED Notes (Signed)
Pt asked RN about anything to help him rest and stated that he is becoming agitated and is very restless. Pt started to yell at RN and this RN attempted to de-escalate.  I explained to pt, that he does not have anything due at this time but that I would speak to the provider.  Dr. Suezanne Jacquet messaged and IM ativan ordered.  See EMAR.

## 2022-01-18 NOTE — ED Notes (Addendum)
Pt informed this RN that he is feeling nauseated and anxious.  Reports that he normally does heroin and drinks alcohol and feels he may be withdrawing.  CIWA score done.  PRN medications administered.  Dr. Suezanne Jacquet made aware.

## 2022-01-18 NOTE — ED Notes (Signed)
Pt reports that he vomited up the medication that he had just received.  Pt's sister-in-law visiting at this time.

## 2022-01-18 NOTE — ED Notes (Signed)
Pt showering; clean scrubs provided.

## 2022-01-18 NOTE — Progress Notes (Signed)
[facility] Psych ED Progress Note  01/18/2022 10:21 AM MARKEITH JUE  MRN:  [id number]   Subjective:  "I just want to die"  Principal Problem: Suicidal ideation Diagnosis:  Principal Problem:   Suicidal ideation Active Problems:   Polysubstance abuse (HCC)   MDD (major depressive disorder), recurrent episode [facility])   ED Assessment Time Calculation: Start Time: 0945 Stop Time: 1015 Total Time in Minutes (Assessment Completion): 30  Jaquavius D Rauen, 51-year-old male with a history of substance-induced mood disorder, polysubstance use (heroin and alcohol) presented to [name], ED with suicidal ideations and concern for reaction to fentanyl he had a prior to off of the street. Patient evaluated today face-to-face per TTS consult for psychiatric evaluation.  Patient endorses that his last use of substance was on Monday evening prior to his arrival here at the ED.  He continues to endorse ongoing withdrawal symptoms of nausea with vomiting, poor appetite, feeling jittery, and difficulty sleeping.  Patient subsequently tested positive for influenza A which has delayed his transfer to [facility] which she was excepted.  Patient will transfer to [facility] on 01/20/2022.   Sebastian continues to endorse suicidal ideations.  He tells this Clinical research associate today "I just want to die".  Patient is interested in starting Prozac.  He reports he has never been on Prozac previously however he discussed with the other TTS provider and at the time he did not feel that he needed an antidepressant but today he is wanting to start SSRI treatment for depression.  Patient continues to be on detox protocol with Clonidine, Ativan and Robaxin.  Patient continues to endorse insomnia despite this writer increase in his trazodone from 50-100 and his hydroxyzine from 25 mg 3 times daily to 50 mg 3 times daily.  Of note when the EDP went to evaluate patient he was asleep however patient tells this Clinical research associate he has not slept in several  days.  During evaluation KANAN SOBEK is sitting up right on his bed without acute distress.  He is alert, oriented x 4, irritable, however, cooperative, with inattentiveness during .  His mood is dysphoric, irritable, anxious with a congruent affect.  His speech is normal behavior appropriate for situation. Objectively there is no evidence of psychosis/mania or delusional thinking.  Patient is able to converse coherently, goal directed thoughts, no distractibility, or pre-occupation.  Patient continues to endorse suicidal ideations without a specific plan.  Patient is unable to contract for safety and continues to meet inpatient criteria.  Patient has been accepted to [facility] and will transfer on 01/20/2022.  [place] Scale:  Flowsheet Row ED from 01/15/2022 in [facility] EMERGENCY DEPARTMENT ED from 08/14/2021 in [facility]  C-SSRS RISK CATEGORY Moderate Risk No Risk       Past Medical History:  Past Medical History:  Diagnosis Date   Chronic back pain    ETOH abuse    Opiate addiction (HCC)    History reviewed. No pertinent surgical history. Family History:  Family History  Problem Relation Age of Onset   Alcoholism Father    Testicular cancer Father    Heart Problems Father    Hernia Father    Intracerebral hemorrhage Maternal Grandmother    Social History:  Social History   Substance and Sexual Activity  Alcohol Use Yes     Social History   Substance and Sexual Activity  Drug Use Yes   Comment: heroin    Social History  Socioeconomic History   Marital status: Single    Spouse name: Not on file   Number of children: Not on file   Years of education: Not on file   Highest education level: Not on file  Occupational History   Not on file  Tobacco Use   Smoking status: Every Day    Packs/day: 2.00    Types: Cigarettes   Smokeless tobacco: Never  Vaping Use   Vaping Use: Never used  Substance  and Sexual Activity   Alcohol use: Yes   Drug use: Yes    Comment: heroin   Sexual activity: Not on file  Other Topics Concern   Not on file  Social History Narrative   Not on file   Social Determinants of Health   Financial Resource Strain: Not on file  Food Insecurity: Not on file  Transportation Needs: Not on file  Physical Activity: Not on file  Stress: Not on file  Social Connections: Not on file   Current Medications: Current Facility-Administered Medications  Medication Dose Route Frequency Provider Last Rate Last Admin   cloNIDine (CATAPRES) tablet 0.1 mg  0.1 mg Oral BID Vanetta Mulders, MD   0.1 mg at 01/18/22 5456   Followed by   Melene Muller ON 01/20/2022] cloNIDine (CATAPRES) tablet 0.1 mg  0.1 mg Oral Daily Vanetta Mulders, MD   0.1 mg at 01/15/22 2147   dicyclomine (BENTYL) tablet 20 mg  20 mg Oral Q6H PRN Vanetta Mulders, MD   20 mg at 01/16/22 0448   FLUoxetine (PROZAC) capsule 20 mg  20 mg Oral Daily Bing Neighbors, FNP       hydrOXYzine (ATARAX) tablet 50 mg  50 mg Oral Q6H PRN Bing Neighbors, FNP   50 mg at 01/18/22 0745   loperamide (IMODIUM) capsule 2-4 mg  2-4 mg Oral PRN Vanetta Mulders, MD       LORazepam (ATIVAN) tablet 1 mg  1 mg Oral Q6H PRN Vanetta Mulders, MD   1 mg at 01/18/22 0746   methocarbamol (ROBAXIN) tablet 500 mg  500 mg Oral Q8H PRN Vanetta Mulders, MD   500 mg at 01/18/22 0529   naproxen (NAPROSYN) tablet 500 mg  500 mg Oral BID PRN Vanetta Mulders, MD   500 mg at 01/17/22 2334   nicotine (NICODERM CQ - dosed in mg/24 hours) patch 14 mg  14 mg Transdermal Daily Vanetta Mulders, MD   14 mg at 01/18/22 2563   ondansetron (ZOFRAN-ODT) disintegrating tablet 4 mg  4 mg Oral Q6H PRN Vanetta Mulders, MD   4 mg at 01/18/22 0745   traZODone (DESYREL) tablet 100 mg  100 mg Oral QHS PRN Bing Neighbors, FNP   100 mg at 01/17/22 2023   Current Outpatient Medications  Medication Sig Dispense Refill   buprenorphine-naloxone (SUBOXONE) 8-2  mg SUBL SL tablet Place 1 tablet under the tongue every 6 (six) hours as needed (withdrawals).     cloNIDine (CATAPRES) 0.1 MG tablet Take 0.1 mg by mouth 2 (two) times daily.     ibuprofen (ADVIL) 800 MG tablet Take 800 mg by mouth every 6 (six) hours as needed for mild pain or moderate pain.     ondansetron (ZOFRAN-ODT) 4 MG disintegrating tablet Dissolve 1 tablet (4 mg total) by mouth every 8 (eight) hours as needed for nausea or vomiting. 20 tablet 0    Lab Results:  Results for orders placed or performed during the hospital encounter of 01/15/22 (from the past 48 hour(s))  Urine rapid drug screen (hosp performed)     Status: Abnormal   Collection Time: 01/17/22  8:00 AM  Result Value Ref Range   Opiates NONE DETECTED NONE DETECTED   Cocaine POSITIVE (A) NONE DETECTED   Benzodiazepines NONE DETECTED NONE DETECTED   Amphetamines NONE DETECTED NONE DETECTED   Tetrahydrocannabinol NONE DETECTED NONE DETECTED   Barbiturates NONE DETECTED NONE DETECTED    Comment: (NOTE) DRUG SCREEN FOR MEDICAL PURPOSES ONLY.  IF CONFIRMATION IS NEEDED FOR ANY PURPOSE, NOTIFY LAB WITHIN 5 DAYS.  LOWEST DETECTABLE LIMITS FOR URINE DRUG SCREEN Drug Class                     Cutoff (ng/mL) Amphetamine and metabolites    1000 Barbiturate and metabolites    200 Benzodiazepine                 200 Opiates and metabolites        300 Cocaine and metabolites        300 THC                            50 Performed at [facility], [address] [place], [place] [id number]     Blood Alcohol level:  Lab Results  Component Value Date   [facility] 01/15/2022   ETH <10 05/01/2020    Physical Findings:  CIWA:  CIWA-Ar Total: 4 COWS:       Psychiatric Specialty Exam:  Presentation  General Appearance:  Disheveled  Eye Contact: Fair  Speech: Clear and Coherent  Speech Volume: Normal  Handedness: Right   Mood and Affect  Mood: Dysphoric; Anxious  Affect: Congruent   Thought  Process  Thought Processes: Coherent  Descriptions of Associations:Intact  Orientation:Full (Time, Place and Person)  Thought Content:Logical  History of Schizophrenia/Schizoaffective disorder:No  Duration of Psychotic Symptoms:No data recorded Hallucinations:Hallucinations: None  Ideas of Reference:None  Suicidal Thoughts:Suicidal Thoughts: Yes, Passive SI Passive Intent and/or Plan: Without Plan  Homicidal Thoughts:Homicidal Thoughts: No   Sensorium  Memory: Immediate Good; Recent Good; Remote Good  Judgment: Fair  Insight: Fair   Chartered certified accountantxecutive Functions  Concentration: Fair  Attention Span: Fair  Recall: Good  Fund of Knowledge: Good  Language: Good   Psychomotor Activity  Psychomotor Activity: Psychomotor Activity: Normal   Assets  Assets: Desire for Improvement   Sleep  Sleep: Sleep: Poor    Physical Exam: Physical Exam Vitals reviewed.  Constitutional:      Appearance: Normal appearance.  HENT:     Head: Normocephalic and atraumatic.  Eyes:     Extraocular Movements: Extraocular movements intact.     Pupils: Pupils are equal, round, and reactive to light.  Cardiovascular:     Rate and Rhythm: Tachycardia present.  Pulmonary:     Effort: Pulmonary effort is normal.     Breath sounds: Normal breath sounds.  Musculoskeletal:        General: Normal range of motion.     Cervical back: Normal range of motion.  Neurological:     General: No focal deficit present.     Mental Status: He is alert.    Review of Systems  Gastrointestinal:  Positive for nausea and vomiting.  Psychiatric/Behavioral:  Positive for depression, substance abuse and suicidal ideas. The patient is nervous/anxious and has insomnia.    Blood pressure 117/84, pulse 100, temperature 98 F (36.7 C), resp. rate 19, height 6' (1.829  m), weight 81.6 kg, SpO2 99 %. Body mass index is 24.41 kg/m.   Medical Decision Making: Patient case review and discussed with  Dr. Viviano Simas, and patient continues to meet inpatient psychiatric treatment criteria. Patient is unable to contract for safety at this time.  Patient has been accepted to [facility] and will transfer on 01/20/2022 after 9 AM.  Problem 1: Suicidal ideations , continue safety precautions with 1:1 sitter at bedside Problem 2: Polysubstance Use Disorder , continue withdrawal protocol Problem 3: Major Depression, recurrent, active, Start Prozac 20 mg daily    Joaquin Courts, FNP-C, PMHNP-BC  01/18/2022, 10:21 AM

## 2022-01-18 NOTE — ED Notes (Signed)
Dr. Suezanne Jacquet rounded on pt.  Pt in bed, sleeping. Equal chest rise and fall noted.

## 2022-01-18 NOTE — ED Notes (Signed)
Voluntary at this time. 

## 2022-01-19 ENCOUNTER — Emergency Department (HOSPITAL_COMMUNITY): Payer: Medicaid Other

## 2022-01-19 NOTE — ED Notes (Signed)
Pt requested a shower. Educated pt that showers will not be taken all day. Per previous nurse pt had 2 showers during night shift. Pt was not receptive to information provided and proceeded to walk into the shower room.

## 2022-01-19 NOTE — ED Provider Notes (Signed)
Emergency Medicine Observation Re-evaluation Note  Nicholas [name] is a 51 y.o. male, seen on rounds today.  Pt initially presented to the ED for complaints of Medication Reaction and Suicidal Currently, the patient is sleeping. Patient complained to nurse about "feeling withdrawals" but now asleep.  Physical Exam  BP 132/78 (BP Location: Left Arm)   Pulse (!) 59   Temp 98.5 F (36.9 C) (Oral)   Resp 18   Ht 6' (1.829 m)   Wt 81.6 kg   SpO2 97%   BMI 24.41 kg/m  Physical Exam General: NAD Cardiac: normal rate Lungs: normal effort Psych: asleep  ED Course / MDM  EKG:EKG Interpretation  Date/Time:  Monday January 15 2022 21:45:15 EST Ventricular Rate:  68 PR Interval:  154 QRS Duration: 122 QT Interval:  430 QTC Calculation: 457 R Axis:   87 Text Interpretation: Normal sinus rhythm Right bundle branch block Abnormal ECG When compared with ECG of 23-Aug-2021 13:00, PREVIOUS ECG IS PRESENT NO SIGNIFICANT CHANGE SINCE LAST TRACING YESTERDAY Confirmed by Vanetta Mulders [phone] on 01/15/2022 10:12:47 PM  I have reviewed the labs performed to date as well as medications administered while in observation.  Recent changes in the last 24 hours include no acute events.  Plan  Current plan is for inpatient treatment.  Admit to [facility] on Dec 23rd due to positive influenza       Loetta Rough, MD 01/19/22 [phone]

## 2022-01-19 NOTE — Progress Notes (Deleted)
Patient has been denied by [facility] due to no appropriate beds available. Patient meets BH inpatient criteria per Joaquin Courts, NP. Patient has been faxed out to the following facilities:    [facility]  [address] [place] [address] [phone]  [facility]  [address] [place] [place] [address] [phone]  [facility]  [address] [place] [address] [phone]  [facility]  [address], [place] [address] [phone]  [facility]  [address], [place] [address] [phone]  [facility]  [address] [place] [address] [phone]  [facility]  [address] [place], [place] [address] [phone]  [facility]  [address] [place], [place] [address] [phone]  [facility]  [address] [place] [address] [phone]  [facility]  [address] [place] [address] [phone]  [facility]  [address] [place] [address] [phone]  [facility]  [address] [place] [address] [phone]   Damita Dunnings, MSW, LCSW-A  10:00 PM 01/19/2022

## 2022-01-19 NOTE — ED Notes (Signed)
Pt reports that he fell last night in the shower. C/O R knee pain and unable to bend it. MD made aware.

## 2022-01-30 ENCOUNTER — Ambulatory Visit (INDEPENDENT_AMBULATORY_CARE_PROVIDER_SITE_OTHER): Payer: Medicaid Other | Admitting: Psychiatry

## 2022-01-30 ENCOUNTER — Ambulatory Visit (HOSPITAL_COMMUNITY): Payer: Self-pay | Admitting: Physician Assistant

## 2022-01-30 ENCOUNTER — Encounter (HOSPITAL_COMMUNITY): Payer: Self-pay | Admitting: Psychiatry

## 2022-01-30 DIAGNOSIS — F1110 Opioid abuse, uncomplicated: Secondary | ICD-10-CM | POA: Diagnosis not present

## 2022-01-30 DIAGNOSIS — F411 Generalized anxiety disorder: Secondary | ICD-10-CM | POA: Diagnosis not present

## 2022-01-30 DIAGNOSIS — F330 Major depressive disorder, recurrent, mild: Secondary | ICD-10-CM

## 2022-01-30 MED ORDER — MIRTAZAPINE 15 MG PO TABS: 15.0000 mg | ORAL_TABLET | Freq: Every day | ORAL | 3 refills | Status: DC
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
## 2022-04-26 ENCOUNTER — Telehealth (HOSPITAL_COMMUNITY): Payer: Medicaid Other | Admitting: Psychiatry

## 2022-04-26 ENCOUNTER — Encounter (HOSPITAL_COMMUNITY): Payer: Self-pay

## 2022-07-31 ENCOUNTER — Emergency Department (HOSPITAL_COMMUNITY): Payer: MEDICAID

## 2022-07-31 ENCOUNTER — Observation Stay (HOSPITAL_COMMUNITY)
Admission: EM | Admit: 2022-07-31 | Discharge: 2022-08-01 | Disposition: A | Discharge status: Home / Self Care | Payer: MEDICAID | Attending: Surgery | Admitting: Surgery

## 2022-07-31 ENCOUNTER — Other Ambulatory Visit: Payer: Self-pay

## 2022-07-31 DIAGNOSIS — Z23 Encounter for immunization: Secondary | ICD-10-CM | POA: Insufficient documentation

## 2022-07-31 DIAGNOSIS — M25561 Pain in right knee: Secondary | ICD-10-CM | POA: Insufficient documentation

## 2022-07-31 DIAGNOSIS — F17210 Nicotine dependence, cigarettes, uncomplicated: Secondary | ICD-10-CM | POA: Insufficient documentation

## 2022-07-31 DIAGNOSIS — Y92410 Unspecified street and highway as the place of occurrence of the external cause: Secondary | ICD-10-CM | POA: Insufficient documentation

## 2022-07-31 DIAGNOSIS — R21 Rash and other nonspecific skin eruption: Secondary | ICD-10-CM | POA: Diagnosis not present

## 2022-07-31 DIAGNOSIS — S02609A Fracture of mandible, unspecified, initial encounter for closed fracture: Secondary | ICD-10-CM

## 2022-07-31 DIAGNOSIS — M542 Cervicalgia: Secondary | ICD-10-CM | POA: Diagnosis present

## 2022-07-31 DIAGNOSIS — Z79899 Other long term (current) drug therapy: Secondary | ICD-10-CM | POA: Insufficient documentation

## 2022-07-31 LAB — COMPREHENSIVE METABOLIC PANEL
ALT: 25 U/L (ref 0–44)
AST: 33 U/L (ref 15–41)
Albumin: 3.3 g/dL — ABNORMAL LOW (ref 3.5–5.0)
Alkaline Phosphatase: 56 U/L (ref 38–126)
Anion gap: 10 (ref 5–15)
BUN: 24 mg/dL — ABNORMAL HIGH (ref 6–20)
CO2: 23 mmol/L (ref 22–32)
Calcium: 8.8 mg/dL — ABNORMAL LOW (ref 8.9–10.3)
Chloride: 104 mmol/L (ref 98–111)
Creatinine, Ser: 1.16 mg/dL (ref 0.61–1.24)
GFR, Estimated: >60 mL/min (ref >60)
Glucose, Bld: 122 mg/dL — ABNORMAL HIGH (ref 70–99)
Potassium: 4.2 mmol/L (ref 3.5–5.1)
Sodium: 137 mmol/L (ref 135–145)
Total Bilirubin: 0.3 mg/dL (ref 0.3–1.2)
Total Protein: 6.5 g/dL (ref 6.5–8.1)

## 2022-07-31 LAB — CBC
HCT: 37.5 % — ABNORMAL LOW (ref 39.0–52.0)
Hemoglobin: 11.9 g/dL — ABNORMAL LOW (ref 13.0–17.0)
MCH: 28.4 pg (ref 26.0–34.0)
MCHC: 31.7 g/dL (ref 30.0–36.0)
MCV: 89.5 fL (ref 80.0–100.0)
Platelets: 262 10*3/uL (ref 150–400)
RBC: 4.19 MIL/uL — ABNORMAL LOW (ref 4.22–5.81)
RDW: 13.8 % (ref 11.5–15.5)
WBC: 13.1 10*3/uL — ABNORMAL HIGH (ref 4.0–10.5)
nRBC: 0 % (ref 0.0–0.2)

## 2022-07-31 LAB — PROTIME-INR
INR: 1.1 (ref 0.8–1.2)
Prothrombin Time: 14.3 seconds (ref 11.4–15.2)

## 2022-07-31 LAB — I-STAT CHEM 8, ED
BUN: 26 mg/dL — ABNORMAL HIGH (ref 6–20)
Calcium, Ion: 1.14 mmol/L — ABNORMAL LOW (ref 1.15–1.40)
Chloride: 104 mmol/L (ref 98–111)
Creatinine, Ser: 1.1 mg/dL (ref 0.61–1.24)
Glucose, Bld: 120 mg/dL — ABNORMAL HIGH (ref 70–99)
HCT: 36 % — ABNORMAL LOW (ref 39.0–52.0)
Hemoglobin: 12.2 g/dL — ABNORMAL LOW (ref 13.0–17.0)
Potassium: 4.2 mmol/L (ref 3.5–5.1)
Sodium: 139 mmol/L (ref 135–145)
TCO2: 25 mmol/L (ref 22–32)

## 2022-07-31 LAB — SAMPLE TO BLOOD BANK

## 2022-07-31 LAB — ETHANOL: Alcohol, Ethyl (B): <10 mg/dL (ref <10)

## 2022-07-31 LAB — LACTIC ACID, PLASMA: Lactic Acid, Venous: 1.7 mmol/L (ref 0.5–1.9)

## 2022-07-31 MED ORDER — LACTATED RINGERS IV BOLUS
1000.0000 mL | Freq: Once | INTRAVENOUS | Status: AC
  Administered 2022-07-31: 1000 mL via INTRAVENOUS

## 2022-07-31 MED ORDER — FENTANYL CITRATE PF 50 MCG/ML IJ SOSY
50.0000 ug | PREFILLED_SYRINGE | Freq: Once | INTRAMUSCULAR | Status: AC
  Administered 2022-07-31: 50 ug via INTRAVENOUS
  Filled 2022-07-31: qty 1

## 2022-07-31 MED ORDER — TETANUS-DIPHTH-ACELL PERTUSSIS 5-2.5-18.5 LF-MCG/0.5 IM SUSY
0.5000 mL | PREFILLED_SYRINGE | Freq: Once | INTRAMUSCULAR | Status: AC
  Administered 2022-07-31: 0.5 mL via INTRAMUSCULAR
  Filled 2022-07-31: qty 0.5

## 2022-07-31 MED ORDER — IOHEXOL 350 MG/ML SOLN
75.0000 mL | Freq: Once | INTRAVENOUS | Status: AC | PRN
  Administered 2022-07-31: 75 mL via INTRAVENOUS
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
## 2022-07-31 NOTE — ED Notes (Signed)
Pt son called and updated on condition.  Pt back from CT

## 2022-07-31 NOTE — ED Notes (Signed)
Unable to get IV on pt at this time
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
## 2022-07-31 NOTE — ED Notes (Signed)
Consent signed and at bedside  
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
## 2022-08-01 ENCOUNTER — Other Ambulatory Visit: Payer: Self-pay

## 2022-08-01 ENCOUNTER — Other Ambulatory Visit (HOSPITAL_COMMUNITY): Payer: Self-pay

## 2022-08-01 ENCOUNTER — Encounter (HOSPITAL_COMMUNITY): Payer: Self-pay

## 2022-08-01 ENCOUNTER — Encounter (HOSPITAL_COMMUNITY)
Admission: EM | Disposition: A | Discharge status: Home / Self Care | Payer: Self-pay | Source: Home / Self Care | Attending: Emergency Medicine

## 2022-08-01 ENCOUNTER — Emergency Department (HOSPITAL_COMMUNITY): Payer: MEDICAID

## 2022-08-01 ENCOUNTER — Observation Stay (HOSPITAL_COMMUNITY): Payer: MEDICAID | Admitting: Certified Registered Nurse Anesthetist

## 2022-08-01 DIAGNOSIS — S02609A Fracture of mandible, unspecified, initial encounter for closed fracture: Secondary | ICD-10-CM | POA: Diagnosis present

## 2022-08-01 HISTORY — PX: ORIF MANDIBULAR FRACTURE: SHX2127

## 2022-08-01 LAB — CBC
HCT: 36.4 % — ABNORMAL LOW (ref 39.0–52.0)
Hemoglobin: 11.6 g/dL — ABNORMAL LOW (ref 13.0–17.0)
MCH: 28.8 pg (ref 26.0–34.0)
MCHC: 31.9 g/dL (ref 30.0–36.0)
MCV: 90.3 fL (ref 80.0–100.0)
Platelets: 249 10*3/uL (ref 150–400)
RBC: 4.03 MIL/uL — ABNORMAL LOW (ref 4.22–5.81)
RDW: 13.9 % (ref 11.5–15.5)
WBC: 12.4 10*3/uL — ABNORMAL HIGH (ref 4.0–10.5)
nRBC: 0 % (ref 0.0–0.2)

## 2022-08-01 LAB — BASIC METABOLIC PANEL
Anion gap: 6 (ref 5–15)
BUN: 22 mg/dL — ABNORMAL HIGH (ref 6–20)
CO2: 25 mmol/L (ref 22–32)
Calcium: 8.6 mg/dL — ABNORMAL LOW (ref 8.9–10.3)
Chloride: 106 mmol/L (ref 98–111)
Creatinine, Ser: 1.03 mg/dL (ref 0.61–1.24)
GFR, Estimated: >60 mL/min (ref >60)
Glucose, Bld: 113 mg/dL — ABNORMAL HIGH (ref 70–99)
Potassium: 4.4 mmol/L (ref 3.5–5.1)
Sodium: 137 mmol/L (ref 135–145)

## 2022-08-01 LAB — SURGICAL PCR SCREEN
MRSA, PCR: NEGATIVE
Staphylococcus aureus: NEGATIVE

## 2022-08-01 LAB — HIV ANTIBODY (ROUTINE TESTING W REFLEX): HIV Screen 4th Generation wRfx: NONREACTIVE

## 2022-08-01 SURGERY — OPEN REDUCTION INTERNAL FIXATION (ORIF) MANDIBULAR FRACTURE
Anesthesia: General

## 2022-08-01 MED ORDER — CEPHALEXIN 250 MG PO CAPS
250.0000 mg | ORAL_CAPSULE | Freq: Four times a day (QID) | ORAL | 0 refills | Status: AC
  Filled 2022-08-01: qty 28, 7d supply, fill #0

## 2022-08-01 MED ORDER — FENTANYL CITRATE (PF) 250 MCG/5ML IJ SOLN
INTRAMUSCULAR | Status: AC
  Filled 2022-08-01: qty 5

## 2022-08-01 MED ORDER — METHOCARBAMOL 1000 MG/10ML IJ SOLN
500.0000 mg | Freq: Three times a day (TID) | INTRAVENOUS | Status: DC
  Administered 2022-08-01 (×2): 500 mg via INTRAVENOUS
  Filled 2022-08-01 (×2): qty 500

## 2022-08-01 MED ORDER — OXYMETAZOLINE HCL 0.05 % NA SOLN
NASAL | Status: DC | PRN
  Administered 2022-08-01: 1 via NASAL

## 2022-08-01 MED ORDER — EPINEPHRINE HCL (NASAL) 0.1 % NA SOLN
NASAL | Status: AC
  Filled 2022-08-01: qty 30

## 2022-08-01 MED ORDER — OXYCODONE HCL 5 MG PO TABS: 5.0000 mg | ORAL_TABLET | ORAL | Status: DC | PRN

## 2022-08-01 MED ORDER — FENTANYL CITRATE (PF) 250 MCG/5ML IJ SOLN
INTRAMUSCULAR | Status: DC | PRN
  Administered 2022-08-01: 100 ug via INTRAVENOUS
  Administered 2022-08-01 (×3): 50 ug via INTRAVENOUS

## 2022-08-01 MED ORDER — ENOXAPARIN SODIUM 30 MG/0.3ML IJ SOSY: 30.0000 mg | PREFILLED_SYRINGE | Freq: Two times a day (BID) | INTRAMUSCULAR | Status: DC

## 2022-08-01 MED ORDER — LIDOCAINE 2% (20 MG/ML) 5 ML SYRINGE
INTRAMUSCULAR | Status: DC | PRN
  Administered 2022-08-01: 100 mg via INTRAVENOUS

## 2022-08-01 MED ORDER — ONDANSETRON HCL 4 MG/2ML IJ SOLN: 4.0000 mg | Freq: Four times a day (QID) | INTRAMUSCULAR | Status: DC | PRN

## 2022-08-01 MED ORDER — POLYETHYLENE GLYCOL 3350 17 G PO PACK: 17.0000 g | PACK | Freq: Every day | ORAL | Status: DC | PRN

## 2022-08-01 MED ORDER — DEXMEDETOMIDINE HCL IN NACL 80 MCG/20ML IV SOLN
INTRAVENOUS | Status: DC | PRN
  Administered 2022-08-01 (×3): 4 ug via INTRAVENOUS
  Administered 2022-08-01: 8 ug via INTRAVENOUS

## 2022-08-01 MED ORDER — LACTATED RINGERS IV SOLN: INTRAVENOUS | Status: DC

## 2022-08-01 MED ORDER — ACETAMINOPHEN 10 MG/ML IV SOLN
INTRAVENOUS | Status: DC | PRN
  Administered 2022-08-01: 1000 mg via INTRAVENOUS

## 2022-08-01 MED ORDER — CEFAZOLIN SODIUM 1 G IJ SOLR
INTRAMUSCULAR | Status: AC
  Filled 2022-08-01: qty 20

## 2022-08-01 MED ORDER — CEFAZOLIN SODIUM-DEXTROSE 2-3 GM-%(50ML) IV SOLR
INTRAVENOUS | Status: DC | PRN
  Administered 2022-08-01: 2 g via INTRAVENOUS

## 2022-08-01 MED ORDER — PROPOFOL 10 MG/ML IV BOLUS
INTRAVENOUS | Status: DC | PRN
  Administered 2022-08-01: 40 mg via INTRAVENOUS
  Administered 2022-08-01: 160 mg via INTRAVENOUS

## 2022-08-01 MED ORDER — MIDAZOLAM HCL 2 MG/2ML IJ SOLN
INTRAMUSCULAR | Status: AC
  Filled 2022-08-01: qty 2

## 2022-08-01 MED ORDER — CHLORHEXIDINE GLUCONATE 0.12 % MT SOLN
OROMUCOSAL | Status: AC
  Filled 2022-08-01: qty 15

## 2022-08-01 MED ORDER — OXYCODONE HCL 5 MG PO TABS
5.0000 mg | ORAL_TABLET | ORAL | 0 refills | Status: DC | PRN
  Filled 2022-08-01: qty 30, 3d supply, fill #0

## 2022-08-01 MED ORDER — PROPOFOL 10 MG/ML IV BOLUS
INTRAVENOUS | Status: AC
  Filled 2022-08-01: qty 20

## 2022-08-01 MED ORDER — METHOCARBAMOL 500 MG PO TABS
1000.0000 mg | ORAL_TABLET | Freq: Three times a day (TID) | ORAL | 0 refills | Status: DC | PRN
  Filled 2022-08-01: qty 60, 10d supply, fill #0

## 2022-08-01 MED ORDER — DOCUSATE SODIUM 100 MG PO CAPS
100.0000 mg | ORAL_CAPSULE | Freq: Two times a day (BID) | ORAL | Status: DC
  Filled 2022-08-01: qty 1

## 2022-08-01 MED ORDER — ACETAMINOPHEN 500 MG PO TABS
1000.0000 mg | ORAL_TABLET | Freq: Four times a day (QID) | ORAL | Status: DC
  Filled 2022-08-01 (×3): qty 2

## 2022-08-01 MED ORDER — HYDRALAZINE HCL 20 MG/ML IJ SOLN: 10.0000 mg | INTRAMUSCULAR | Status: DC | PRN

## 2022-08-01 MED ORDER — SODIUM CHLORIDE (PF) 0.9 % IJ SOLN
INTRAMUSCULAR | Status: AC
  Filled 2022-08-01: qty 10

## 2022-08-01 MED ORDER — AMISULPRIDE (ANTIEMETIC) 5 MG/2ML IV SOLN: 10.0000 mg | Freq: Once | INTRAVENOUS | Status: DC | PRN

## 2022-08-01 MED ORDER — HYDROMORPHONE HCL 1 MG/ML IJ SOLN
1.0000 mg | INTRAMUSCULAR | Status: DC | PRN
  Administered 2022-08-01 (×3): 1 mg via INTRAVENOUS
  Filled 2022-08-01 (×3): qty 1

## 2022-08-01 MED ORDER — OXYMETAZOLINE HCL 0.05 % NA SOLN
NASAL | Status: AC
  Filled 2022-08-01: qty 30

## 2022-08-01 MED ORDER — ACETAMINOPHEN 500 MG PO TABS: 1000.0000 mg | ORAL_TABLET | Freq: Four times a day (QID) | ORAL | Status: DC | PRN

## 2022-08-01 MED ORDER — ROCURONIUM BROMIDE 10 MG/ML (PF) SYRINGE
PREFILLED_SYRINGE | INTRAVENOUS | Status: DC | PRN
  Administered 2022-08-01: 60 mg via INTRAVENOUS

## 2022-08-01 MED ORDER — HYDROMORPHONE HCL 1 MG/ML IJ SOLN: 0.2500 mg | INTRAMUSCULAR | Status: DC | PRN

## 2022-08-01 MED ORDER — MUPIROCIN 2 % EX OINT
1.0000 | TOPICAL_OINTMENT | Freq: Two times a day (BID) | CUTANEOUS | 0 refills | Status: DC
  Filled 2022-08-01: qty 22, 5d supply, fill #0

## 2022-08-01 MED ORDER — ORAL CARE MOUTH RINSE: 15.0000 mL | Freq: Once | OROMUCOSAL | Status: DC

## 2022-08-01 MED ORDER — SUGAMMADEX SODIUM 200 MG/2ML IV SOLN
INTRAVENOUS | Status: DC | PRN
  Administered 2022-08-01: 200 mg via INTRAVENOUS

## 2022-08-01 MED ORDER — MUPIROCIN 2 % EX OINT
1.0000 | TOPICAL_OINTMENT | Freq: Two times a day (BID) | CUTANEOUS | Status: DC
  Administered 2022-08-01: 1 via NASAL
  Filled 2022-08-01: qty 22

## 2022-08-01 MED ORDER — OXYMETAZOLINE HCL 0.05 % NA SOLN
NASAL | Status: DC | PRN
  Administered 2022-08-01: 1 via TOPICAL

## 2022-08-01 MED ORDER — SODIUM CHLORIDE 0.9 % IV SOLN: INTRAVENOUS | Status: DC

## 2022-08-01 MED ORDER — ONDANSETRON HCL 4 MG/2ML IJ SOLN
INTRAMUSCULAR | Status: DC | PRN
  Administered 2022-08-01: 4 mg via INTRAVENOUS

## 2022-08-01 MED ORDER — CHLORHEXIDINE GLUCONATE 0.12 % MT SOLN: 15.0000 mL | Freq: Once | OROMUCOSAL | Status: DC

## 2022-08-01 MED ORDER — KETAMINE HCL 50 MG/ML IJ SOLN
INTRAMUSCULAR | Status: DC | PRN
  Administered 2022-08-01: 20 mg via INTRAMUSCULAR
  Administered 2022-08-01: 10 mg via INTRAMUSCULAR

## 2022-08-01 MED ORDER — KETAMINE HCL 50 MG/5ML IJ SOSY
PREFILLED_SYRINGE | INTRAMUSCULAR | Status: AC
  Filled 2022-08-01: qty 5

## 2022-08-01 MED ORDER — METOPROLOL TARTRATE 5 MG/5ML IV SOLN: 5.0000 mg | Freq: Four times a day (QID) | INTRAVENOUS | Status: DC | PRN

## 2022-08-01 MED ORDER — LIDOCAINE-EPINEPHRINE 1 %-1:100000 IJ SOLN
INTRAMUSCULAR | Status: DC | PRN
  Administered 2022-08-01: .5 mL

## 2022-08-01 MED ORDER — ONDANSETRON 4 MG PO TBDP: 4.0000 mg | ORAL_TABLET | Freq: Four times a day (QID) | ORAL | Status: DC | PRN

## 2022-08-01 MED ORDER — METHOCARBAMOL 500 MG PO TABS
1000.0000 mg | ORAL_TABLET | Freq: Three times a day (TID) | ORAL | Status: DC
  Filled 2022-08-01: qty 2

## 2022-08-01 MED ORDER — METHOCARBAMOL 500 MG PO TABS
500.0000 mg | ORAL_TABLET | Freq: Three times a day (TID) | ORAL | Status: DC
  Filled 2022-08-01: qty 1

## 2022-08-01 MED ORDER — DEXAMETHASONE SODIUM PHOSPHATE 10 MG/ML IJ SOLN
INTRAMUSCULAR | Status: DC | PRN
  Administered 2022-08-01: 10 mg via INTRAVENOUS

## 2022-08-01 SURGICAL SUPPLY — 54 items
BAG COUNTER SPONGE SURGICOUNT (BAG) ×1 IMPLANT
BAG SPNG CNTER NS LX DISP (BAG) ×1
BIT DRILL 1.6X115 (BIT) ×1
BIT DRILL 1.6X115MM (BIT) IMPLANT
BLADE HT X-LOCK SHORT 1.5 (BLADE) IMPLANT
BLADE SURG 10 STRL SS (BLADE) IMPLANT
BLADE SURG 15 STRL LF DISP TIS (BLADE) IMPLANT
BLADE SURG 15 STRL SS (BLADE)
CANISTER SUCT 3000ML PPV (MISCELLANEOUS) ×1 IMPLANT
CLEANER TIP ELECTROSURG 2X2 (MISCELLANEOUS) ×1 IMPLANT
COAGULATOR SUCT SWTCH 10FR 6 (ELECTROSURGICAL) IMPLANT
COVER SURGICAL LIGHT HANDLE (MISCELLANEOUS) ×2 IMPLANT
DRAPE HALF SHEET 40X57 (DRAPES) IMPLANT
DRILL BIT 1.6X115MM (BIT) ×1
DRILL TW 1.1X50 15MM STP W/NT (MISCELLANEOUS) IMPLANT
ELECT COATED BLADE 2.86 ST (ELECTRODE) IMPLANT
ELECT NDL BLADE 2-5/6 (NEEDLE) IMPLANT
ELECT NEEDLE BLADE 2-5/6 (NEEDLE) IMPLANT
ELECT REM PT RETURN 9FT ADLT (ELECTROSURGICAL) ×1
ELECTRODE REM PT RTRN 9FT ADLT (ELECTROSURGICAL) ×1 IMPLANT
GLOVE ECLIPSE 7.5 STRL STRAW (GLOVE) ×1 IMPLANT
GOWN STRL REUS W/ TWL LRG LVL3 (GOWN DISPOSABLE) ×2 IMPLANT
GOWN STRL REUS W/TWL LRG LVL3 (GOWN DISPOSABLE) ×2
KIT BASIN OR (CUSTOM PROCEDURE TRAY) ×1 IMPLANT
KIT TURNOVER KIT B (KITS) ×1 IMPLANT
NDL HYPO 25GX1X1/2 BEV (NEEDLE) IMPLANT
NEEDLE HYPO 25GX1X1/2 BEV (NEEDLE) IMPLANT
NS IRRIG 1000ML POUR BTL (IV SOLUTION) ×1 IMPLANT
PAD ARMBOARD 7.5X6 YLW CONV (MISCELLANEOUS) ×2 IMPLANT
PATTIES SURGICAL .5 X3 (DISPOSABLE) IMPLANT
PENCIL BUTTON HOLSTER BLD 10FT (ELECTRODE) IMPLANT
PENCIL FOOT CONTROL (ELECTRODE) ×1 IMPLANT
POSITIONER HEAD DONUT 9IN (MISCELLANEOUS) IMPLANT
PROTECTOR CORNEAL (OPHTHALMIC RELATED) IMPLANT
SCISSORS WIRE ANG 4 3/4 DISP (INSTRUMENTS) IMPLANT
SCREW NLOCK LORENZ 2X5 (Screw) IMPLANT
SCREW NON LOCK HT X-DR 1.5X7 (Screw) IMPLANT
SCREW NON LOCK X-DR 2.0X8 (Screw) IMPLANT
SCREW SD IMF HEX 2.0X7 (Screw) IMPLANT
SCREW SD IMF HEX 2.0X9 (Screw) IMPLANT
SPIKE FLUID TRANSFER (MISCELLANEOUS) ×1 IMPLANT
SUT CHROMIC 3 0 PS 2 (SUTURE) ×1 IMPLANT
SUT CHROMIC 4 0 PS 2 18 (SUTURE) IMPLANT
SUT MNCRL AB 4-0 PS2 18 (SUTURE) ×1 IMPLANT
SUT NYLON ETHILON 5-0 P-3 1X18 (SUTURE) ×1 IMPLANT
SUT SILK 2 0 PERMA HAND 18 BK (SUTURE) IMPLANT
SUT STEEL 0 (SUTURE)
SUT STEEL 0 18XMFL TIE 17 (SUTURE) IMPLANT
SUT STEEL 4 (SUTURE) ×1 IMPLANT
TOWEL GREEN STERILE FF (TOWEL DISPOSABLE) ×1 IMPLANT
TRAY ENT MC OR (CUSTOM PROCEDURE TRAY) ×1 IMPLANT
TW DRILL1.1X50 15MM STP W/NT (MISCELLANEOUS) ×1
WATER STERILE IRR 1000ML POUR (IV SOLUTION) ×1 IMPLANT
WIRE 24 GAUGE OMINIMAX MMF (WIRE) IMPLANT

## 2022-08-01 NOTE — Anesthesia Postprocedure Evaluation (Signed)
Anesthesia Post Note  Patient: Maple Mirza Oliphant  Procedure(s) Performed: OPEN REDUCTION INTERNAL FIXATION (ORIF) MANDIBULAR FRACTURE     Patient location during evaluation: PACU Anesthesia Type: General Level of consciousness: awake and alert Pain management: pain level controlled Vital Signs Assessment: post-procedure vital signs reviewed and stable Respiratory status: spontaneous breathing, nonlabored ventilation and respiratory function stable Cardiovascular status: blood pressure returned to baseline Postop Assessment: no apparent nausea or vomiting Anesthetic complications: no   No notable events documented.  Last Vitals:  Vitals:   08/01/22 1438 08/01/22 1550  BP: 133/89 123/70  Pulse: 74 70  Resp: 18 17  Temp: 36.8 C 36.7 C  SpO2: 95% 97%    Last Pain:  Vitals:   08/01/22 1550  TempSrc: Axillary  PainSc:                  Shanda Howells

## 2022-08-01 NOTE — Anesthesia Preprocedure Evaluation (Addendum)
Anesthesia Evaluation  Patient identified by MRN, date of birth, ID band Patient awake    Reviewed: Allergy & Precautions, NPO status , Patient's Chart, lab work & pertinent test results  History of Anesthesia Complications Negative for: history of anesthetic complications  Airway Mallampati: II  TM Distance: >3 FB Neck ROM: Full    Dental no notable dental hx.    Pulmonary Current Smoker and Patient abstained from smoking.   Pulmonary exam normal        Cardiovascular negative cardio ROS Normal cardiovascular exam     Neuro/Psych    Depression    negative neurological ROS     GI/Hepatic negative GI ROS,,,(+)     substance abuse (on Suboxone)  alcohol use  Endo/Other  negative endocrine ROS    Renal/GU negative Renal ROS  negative genitourinary   Musculoskeletal negative musculoskeletal ROS (+)  narcotic dependent  Abdominal   Peds  Hematology negative hematology ROS (+)   Anesthesia Other Findings mandible fracture  Reproductive/Obstetrics negative OB ROS                              Anesthesia Physical Anesthesia Plan  ASA: 2  Anesthesia Plan: General   Post-op Pain Management: Ofirmev IV (intra-op)* and Toradol IV (intra-op)*   Induction: Intravenous  PONV Risk Score and Plan: 1 and Treatment may vary due to age or medical condition, Ondansetron, Dexamethasone and Midazolam  Airway Management Planned: Oral ETT  Additional Equipment: None  Intra-op Plan:   Post-operative Plan: Extubation in OR  Informed Consent: I have reviewed the patients History and Physical, chart, labs and discussed the procedure including the risks, benefits and alternatives for the proposed anesthesia with the patient or authorized representative who has indicated his/her understanding and acceptance.     Dental advisory given  Plan Discussed with: CRNA  Anesthesia Plan Comments:          Anesthesia Quick Evaluation

## 2022-08-01 NOTE — Progress Notes (Signed)
Pt. returned back to unit after having open reduction of mandible fracture with maxillary mandibular fixation. Wire cutter/suction present at bedside. No s/s of distress noted at this time. Will continue to monitor for changes and report changes to MD accordingly.
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
## 2022-08-01 NOTE — Plan of Care (Signed)

## 2022-08-01 NOTE — Discharge Instructions (Addendum)
Keep wire cutters with you at all times.  Use the Keflex antibiotic for the 7 days.  Follow-up in the office in 2 weeks with the number to call [phone].  Call if you have any questions or problems to the same number.  Stay on a full liquid diet until seen.
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
## 2022-08-01 NOTE — Transfer of Care (Signed)
Immediate Anesthesia Transfer of Care Note  Patient: Nicholas [name]  Procedure(s) Performed: OPEN REDUCTION INTERNAL FIXATION (ORIF) MANDIBULAR FRACTURE  Patient Location: PACU  Anesthesia Type:General  Level of Consciousness: drowsy, patient cooperative, and responds to stimulation  Airway & Oxygen Therapy: Patient Spontanous Breathing  Post-op Assessment: Report given to RN and Post -op Vital signs reviewed and stable  Post vital signs: Reviewed and stable  Last Vitals:  Vitals Value Taken Time  BP    Temp    Pulse 84 08/01/22 1309  Resp 15 08/01/22 1309  SpO2 94 % 08/01/22 1309  Vitals shown include unvalidated device data.  Last Pain:  Vitals:   08/01/22 1031  TempSrc: Oral  PainSc: 9          Complications: No notable events documented.
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
## 2022-08-01 NOTE — Progress Notes (Signed)
AVS, medications, and discharge instructions reviewed with pt. prior to discharge--pt. voices understanding of all instructions given. Medications provided by [facility] pharmacy & wire cutters provided to pt.--pt. voices understanding of use in the event of an emergency. PIV in RAC D/C'd and dry dressing placed. All belongings sent with pt. at time of discharge. Pt. discharged home in stable condition via personal vehicle at this time.
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
## 2022-08-01 NOTE — Plan of Care (Signed)
  Problem: Education: Goal: Knowledge of General Education information will improve Description: Including pain rating scale, medication(s)/side effects and non-pharmacologic comfort measures 08/01/2022 1801 by Weyman Pedro, RN Outcome: Adequate for Discharge 08/01/2022 1411 by Weyman Pedro, RN Outcome: Progressing   Problem: Health Behavior/Discharge Planning: Goal: Ability to manage health-related needs will improve 08/01/2022 1801 by Weyman Pedro, RN Outcome: Adequate for Discharge 08/01/2022 1411 by Weyman Pedro, RN Outcome: Progressing   Problem: Clinical Measurements: Goal: Ability to maintain clinical measurements within normal limits will improve 08/01/2022 1801 by Weyman Pedro, RN Outcome: Adequate for Discharge 08/01/2022 1411 by Weyman Pedro, RN Outcome: Progressing Goal: Will remain free from infection 08/01/2022 1801 by Weyman Pedro, RN Outcome: Adequate for Discharge 08/01/2022 1411 by Weyman Pedro, RN Outcome: Progressing Goal: Diagnostic test results will improve 08/01/2022 1801 by Weyman Pedro, RN Outcome: Adequate for Discharge 08/01/2022 1411 by Weyman Pedro, RN Outcome: Progressing Goal: Respiratory complications will improve 08/01/2022 1801 by Weyman Pedro, RN Outcome: Adequate for Discharge 08/01/2022 1411 by Weyman Pedro, RN Outcome: Progressing Goal: Cardiovascular complication will be avoided 08/01/2022 1801 by Weyman Pedro, RN Outcome: Adequate for Discharge 08/01/2022 1411 by Weyman Pedro, RN Outcome: Progressing   Problem: Activity: Goal: Risk for activity intolerance will decrease 08/01/2022 1801 by Weyman Pedro, RN Outcome: Adequate for Discharge 08/01/2022 1411 by Weyman Pedro, RN Outcome: Progressing   Problem: Nutrition: Goal: Adequate nutrition will be maintained 08/01/2022 1801 by Weyman Pedro, RN Outcome: Adequate for Discharge 08/01/2022 1411 by Weyman Pedro, RN Outcome: Progressing   Problem: Coping: Goal: Level of anxiety will decrease 08/01/2022 1801 by Weyman Pedro, RN Outcome: Adequate for Discharge 08/01/2022 1411 by Weyman Pedro, RN Outcome: Progressing   Problem: Elimination: Goal: Will not experience complications related to bowel motility 08/01/2022 1801 by Weyman Pedro, RN Outcome: Adequate for Discharge 08/01/2022 1411 by Weyman Pedro, RN Outcome: Progressing Goal: Will not experience complications related to urinary retention 08/01/2022 1801 by Weyman Pedro, RN Outcome: Adequate for Discharge 08/01/2022 1411 by Weyman Pedro, RN Outcome: Progressing   Problem: Pain Managment: Goal: General experience of comfort will improve 08/01/2022 1801 by Weyman Pedro, RN Outcome: Adequate for Discharge 08/01/2022 1411 by Weyman Pedro, RN Outcome: Progressing   Problem: Safety: Goal: Ability to remain free from injury will improve 08/01/2022 1801 by Weyman Pedro, RN Outcome: Adequate for Discharge 08/01/2022 1411 by Weyman Pedro, RN Outcome: Progressing   Problem: Skin Integrity: Goal: Risk for impaired skin integrity will decrease 08/01/2022 1801 by Weyman Pedro, RN Outcome: Adequate for Discharge 08/01/2022 1411 by Weyman Pedro, RN Outcome: Progressing
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
## 2022-08-01 NOTE — TOC CAGE-AID Note (Signed)
Transition of Care [facility]) - CAGE-AID Screening   Patient Details  Name: Nicholas [name] MRN: [id number] Date of Birth: 02/20/1970  Transition of Care [facility]) CM/SW Contact:    Leota Sauers, RN Phone Number: 07/31/[phone]   Clinical Narrative:  Patient endorses use of alcohol and drugs, denies needs for resources at this time.  CAGE-AID Screening:    Have You Ever Felt You Ought to Cut Down on Your Drinking or Drug Use?: No Have People Annoyed You By Critizing Your Drinking Or Drug Use?: No Have You Felt Bad Or Guilty About Your Drinking Or Drug Use?: No Have You Ever Had a Drink or Used Drugs First Thing In The Morning to Steady Your Nerves or to Get Rid of a Hangover?: No CAGE-AID Score: 0  Substance Abuse Education Offered: No (denies needs for resorces)
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
## 2022-08-03 ENCOUNTER — Encounter (HOSPITAL_COMMUNITY): Payer: Self-pay | Admitting: Otolaryngology

## 2022-08-10 ENCOUNTER — Telehealth: Payer: Self-pay | Admitting: Otolaryngology

## 2022-08-10 NOTE — Telephone Encounter (Signed)
Patient called to schedule hospital follow up. He was seen 07/31/22 and surgery with Dr Jearld Fenton 08/01/22. I scheduled with Dr Jearld Fenton at the next day he had someone scheduled in office but patient wanted to know if he could or if he should be seen sooner. Please advise

## 2022-08-13 ENCOUNTER — Other Ambulatory Visit (INDEPENDENT_AMBULATORY_CARE_PROVIDER_SITE_OTHER): Payer: Self-pay | Admitting: Physician Assistant

## 2022-08-13 MED ORDER — OXYCODONE-ACETAMINOPHEN 5-325 MG PO TABS: 1.0000 | ORAL_TABLET | Freq: Four times a day (QID) | ORAL | 0 refills | Status: DC | PRN

## 2022-08-13 NOTE — Progress Notes (Signed)
Pain medication refill after ORIF

## 2022-08-13 NOTE — Telephone Encounter (Signed)
Patient scheduled 08/16/22 with Dr Irene Pap and is aware medication was sent in

## 2022-08-13 NOTE — Telephone Encounter (Signed)
Yes that's fine 
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
## 2022-08-16 ENCOUNTER — Ambulatory Visit (INDEPENDENT_AMBULATORY_CARE_PROVIDER_SITE_OTHER): Payer: MEDICAID | Admitting: Otolaryngology

## 2022-08-16 ENCOUNTER — Encounter (INDEPENDENT_AMBULATORY_CARE_PROVIDER_SITE_OTHER): Payer: Self-pay | Admitting: Otolaryngology

## 2022-08-16 VITALS — BP 101/65 | HR 103 | Ht 72.0 in | Wt 140.0 lb

## 2022-08-16 DIAGNOSIS — Z9889 Other specified postprocedural states: Secondary | ICD-10-CM

## 2022-08-16 DIAGNOSIS — S02670D Fracture of alveolus of mandible, unspecified side, subsequent encounter for fracture with routine healing: Secondary | ICD-10-CM

## 2022-08-16 DIAGNOSIS — S02609D Fracture of mandible, unspecified, subsequent encounter for fracture with routine healing: Secondary | ICD-10-CM

## 2022-08-16 DIAGNOSIS — Z09 Encounter for follow-up examination after completed treatment for conditions other than malignant neoplasm: Secondary | ICD-10-CM

## 2022-08-16 DIAGNOSIS — S02651B Fracture of angle of right mandible, initial encounter for open fracture: Secondary | ICD-10-CM

## 2022-08-16 MED ORDER — AMOXICILLIN-POT CLAVULANATE 875-125 MG PO TABS: 1.0000 | ORAL_TABLET | Freq: Two times a day (BID) | ORAL | 0 refills | Status: AC

## 2022-08-16 MED ORDER — ACETAMINOPHEN 500 MG PO TABS: 500.0000 mg | ORAL_TABLET | Freq: Four times a day (QID) | ORAL | 1 refills | Status: AC | PRN

## 2022-08-16 MED ORDER — CHLORHEXIDINE GLUCONATE 0.12 % MT SOLN: 15.0000 mL | Freq: Two times a day (BID) | OROMUCOSAL | 0 refills | Status: DC

## 2022-08-16 MED ORDER — IBUPROFEN 600 MG PO TABS: 600.0000 mg | ORAL_TABLET | Freq: Four times a day (QID) | ORAL | 0 refills | Status: DC | PRN

## 2022-08-16 NOTE — Patient Instructions (Addendum)
-   take Tylenol and Motrin for pain - use special mouth wash prescribed to you for 1 week then stop - maintain good oral hygiene  - soft diet (avoid wide mouth opening) - take antibiotic we prescribed until you finish - return to see [place] in 2 weeks - make an appointment with OMFS as soon as possible to evaluate dentition and offer additional interventions

## 2022-08-24 ENCOUNTER — Telehealth: Payer: Self-pay | Admitting: Otolaryngology

## 2022-08-24 ENCOUNTER — Telehealth: Payer: Self-pay

## 2022-08-24 ENCOUNTER — Ambulatory Visit (INDEPENDENT_AMBULATORY_CARE_PROVIDER_SITE_OTHER): Payer: MEDICAID | Admitting: Otolaryngology

## 2022-08-24 NOTE — Telephone Encounter (Signed)
Patient called and lvm, he said that he has a gaping hole in his mouth and also the screws are bothering him real bad. He said he needs someone to check out the spot. He saw Dr Irene Pap on 08/16/22 and is requesting a call back from someone. Please advise. Im not sure if the hole in his mouth was something yall saw him for or if it was new. Call back is [phone]

## 2022-08-24 NOTE — Telephone Encounter (Signed)
You can call him back and let him know we will see him on Monday. There are some people that need to be moved on Monday so we will have openings. The hole is something we knew about and we wanted him to make sure he reached out to oral surgery for this.

## 2022-08-24 NOTE — Telephone Encounter (Signed)
Pt left voicemail requested a call back to discuss his concerns about the screws and holes in his mouth.

## 2022-08-30 ENCOUNTER — Encounter (INDEPENDENT_AMBULATORY_CARE_PROVIDER_SITE_OTHER): Payer: Self-pay | Admitting: Otolaryngology

## 2022-08-30 ENCOUNTER — Ambulatory Visit (INDEPENDENT_AMBULATORY_CARE_PROVIDER_SITE_OTHER): Payer: MEDICAID | Admitting: Otolaryngology

## 2022-08-30 VITALS — BP 122/77 | HR 107 | Ht 72.0 in | Wt 151.0 lb

## 2022-08-30 DIAGNOSIS — Z09 Encounter for follow-up examination after completed treatment for conditions other than malignant neoplasm: Secondary | ICD-10-CM

## 2022-08-30 DIAGNOSIS — S02670D Fracture of alveolus of mandible, unspecified side, subsequent encounter for fracture with routine healing: Secondary | ICD-10-CM

## 2022-08-30 DIAGNOSIS — S02651B Fracture of angle of right mandible, initial encounter for open fracture: Secondary | ICD-10-CM

## 2022-08-30 DIAGNOSIS — Z9889 Other specified postprocedural states: Secondary | ICD-10-CM

## 2022-08-30 DIAGNOSIS — S02609D Fracture of mandible, unspecified, subsequent encounter for fracture with routine healing: Secondary | ICD-10-CM
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
## 2022-09-11 ENCOUNTER — Telehealth (INDEPENDENT_AMBULATORY_CARE_PROVIDER_SITE_OTHER): Payer: Self-pay | Admitting: Physician Assistant

## 2022-09-11 NOTE — Telephone Encounter (Signed)
I attempted to call Nicholas [name] today to follow up. His phone is not in service. I spoke with his son and asked that he have [name] call the office to give an update on if he has been able to get in to see an oral Careers adviser. He will give him the message.

## 2022-10-17 ENCOUNTER — Ambulatory Visit: Payer: Self-pay
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
## 2022-10-24 ENCOUNTER — Emergency Department (HOSPITAL_COMMUNITY): Payer: MEDICAID

## 2022-10-24 ENCOUNTER — Inpatient Hospital Stay (HOSPITAL_COMMUNITY)
Admission: EM | Admit: 2022-10-24 | Discharge: 2022-10-26 | DRG: 149 | Discharge status: Against Medical Advice | Payer: MEDICAID | Attending: Internal Medicine | Admitting: Internal Medicine

## 2022-10-24 ENCOUNTER — Encounter (HOSPITAL_COMMUNITY): Payer: Self-pay | Admitting: Emergency Medicine

## 2022-10-24 DIAGNOSIS — M25461 Effusion, right knee: Secondary | ICD-10-CM | POA: Diagnosis present

## 2022-10-24 DIAGNOSIS — Z6372 Alcoholism and drug addiction in family: Secondary | ICD-10-CM

## 2022-10-24 DIAGNOSIS — Z5329 Procedure and treatment not carried out because of patient's decision for other reasons: Secondary | ICD-10-CM | POA: Diagnosis present

## 2022-10-24 DIAGNOSIS — Y929 Unspecified place or not applicable: Secondary | ICD-10-CM

## 2022-10-24 DIAGNOSIS — Z87891 Personal history of nicotine dependence: Secondary | ICD-10-CM

## 2022-10-24 DIAGNOSIS — R55 Syncope and collapse: Secondary | ICD-10-CM | POA: Diagnosis present

## 2022-10-24 DIAGNOSIS — F419 Anxiety disorder, unspecified: Secondary | ICD-10-CM | POA: Diagnosis present

## 2022-10-24 DIAGNOSIS — R296 Repeated falls: Secondary | ICD-10-CM | POA: Diagnosis present

## 2022-10-24 DIAGNOSIS — F1193 Opioid use, unspecified with withdrawal: Secondary | ICD-10-CM | POA: Diagnosis present

## 2022-10-24 DIAGNOSIS — E876 Hypokalemia: Secondary | ICD-10-CM | POA: Diagnosis present

## 2022-10-24 DIAGNOSIS — F1410 Cocaine abuse, uncomplicated: Secondary | ICD-10-CM | POA: Diagnosis present

## 2022-10-24 DIAGNOSIS — F1911 Other psychoactive substance abuse, in remission: Secondary | ICD-10-CM | POA: Diagnosis present

## 2022-10-24 DIAGNOSIS — R1310 Dysphagia, unspecified: Secondary | ICD-10-CM

## 2022-10-24 DIAGNOSIS — W1830XA Fall on same level, unspecified, initial encounter: Secondary | ICD-10-CM | POA: Diagnosis present

## 2022-10-24 DIAGNOSIS — F1123 Opioid dependence with withdrawal: Secondary | ICD-10-CM | POA: Diagnosis present

## 2022-10-24 DIAGNOSIS — R42 Dizziness and giddiness: Principal | ICD-10-CM

## 2022-10-24 DIAGNOSIS — N179 Acute kidney failure, unspecified: Principal | ICD-10-CM | POA: Diagnosis present

## 2022-10-24 DIAGNOSIS — S60911A Unspecified superficial injury of right wrist, initial encounter: Secondary | ICD-10-CM | POA: Diagnosis present

## 2022-10-24 DIAGNOSIS — Z811 Family history of alcohol abuse and dependence: Secondary | ICD-10-CM

## 2022-10-24 DIAGNOSIS — S83411A Sprain of medial collateral ligament of right knee, initial encounter: Secondary | ICD-10-CM | POA: Diagnosis present

## 2022-10-24 DIAGNOSIS — M549 Dorsalgia, unspecified: Secondary | ICD-10-CM | POA: Diagnosis present

## 2022-10-24 DIAGNOSIS — G8929 Other chronic pain: Secondary | ICD-10-CM | POA: Diagnosis present

## 2022-10-24 DIAGNOSIS — F1010 Alcohol abuse, uncomplicated: Secondary | ICD-10-CM | POA: Diagnosis present

## 2022-10-24 LAB — COMPREHENSIVE METABOLIC PANEL
ALT: 15 U/L (ref 0–44)
AST: 16 U/L (ref 15–41)
Albumin: 3.6 g/dL (ref 3.5–5.0)
Alkaline Phosphatase: 68 U/L (ref 38–126)
Anion gap: 11 (ref 5–15)
BUN: 11 mg/dL (ref 6–20)
CO2: 25 mmol/L (ref 22–32)
Calcium: 8.8 mg/dL — ABNORMAL LOW (ref 8.9–10.3)
Chloride: 104 mmol/L (ref 98–111)
Creatinine, Ser: 1.95 mg/dL — ABNORMAL HIGH (ref 0.61–1.24)
GFR, Estimated: 40 mL/min — ABNORMAL LOW (ref >60)
Glucose, Bld: 84 mg/dL (ref 70–99)
Potassium: 3.4 mmol/L — ABNORMAL LOW (ref 3.5–5.1)
Sodium: 140 mmol/L (ref 135–145)
Total Bilirubin: 0.5 mg/dL (ref 0.3–1.2)
Total Protein: 6.8 g/dL (ref 6.5–8.1)

## 2022-10-24 LAB — CBC
HCT: 38.1 % — ABNORMAL LOW (ref 39.0–52.0)
Hemoglobin: 12.2 g/dL — ABNORMAL LOW (ref 13.0–17.0)
MCH: 27.9 pg (ref 26.0–34.0)
MCHC: 32 g/dL (ref 30.0–36.0)
MCV: 87 fL (ref 80.0–100.0)
Platelets: 238 10*3/uL (ref 150–400)
RBC: 4.38 MIL/uL (ref 4.22–5.81)
RDW: 14.6 % (ref 11.5–15.5)
WBC: 11.9 10*3/uL — ABNORMAL HIGH (ref 4.0–10.5)
nRBC: 0 % (ref 0.0–0.2)

## 2022-10-24 LAB — MAGNESIUM: Magnesium: 2 mg/dL (ref 1.7–2.4)

## 2022-10-24 MED ORDER — MECLIZINE HCL 25 MG PO TABS
25.0000 mg | ORAL_TABLET | Freq: Three times a day (TID) | ORAL | Status: DC | PRN
  Administered 2022-10-25: 25 mg via ORAL
  Filled 2022-10-24: qty 1

## 2022-10-24 MED ORDER — LACTATED RINGERS IV BOLUS
1000.0000 mL | Freq: Once | INTRAVENOUS | Status: AC
  Administered 2022-10-24: 1000 mL via INTRAVENOUS

## 2022-10-24 MED ORDER — ONDANSETRON HCL 4 MG/2ML IJ SOLN
4.0000 mg | Freq: Once | INTRAMUSCULAR | Status: AC
  Administered 2022-10-24: 4 mg via INTRAVENOUS
  Filled 2022-10-24: qty 2

## 2022-10-24 MED ORDER — ACETAMINOPHEN 325 MG PO TABS
650.0000 mg | ORAL_TABLET | Freq: Once | ORAL | Status: AC
  Administered 2022-10-24: 650 mg via ORAL
  Filled 2022-10-24: qty 2

## 2022-10-24 MED ORDER — POTASSIUM CHLORIDE CRYS ER 20 MEQ PO TBCR
40.0000 meq | EXTENDED_RELEASE_TABLET | Freq: Once | ORAL | Status: AC
  Administered 2022-10-24: 40 meq via ORAL
  Filled 2022-10-24: qty 2

## 2022-10-24 MED ORDER — ENOXAPARIN SODIUM 40 MG/0.4ML IJ SOSY: 40.0000 mg | PREFILLED_SYRINGE | INTRAMUSCULAR | Status: DC

## 2022-10-24 MED ORDER — ACETAMINOPHEN 500 MG PO TABS
1000.0000 mg | ORAL_TABLET | Freq: Four times a day (QID) | ORAL | Status: DC | PRN
  Administered 2022-10-25: 1000 mg via ORAL
  Filled 2022-10-24: qty 2

## 2022-10-24 MED ORDER — ONDANSETRON HCL 4 MG/2ML IJ SOLN
4.0000 mg | Freq: Four times a day (QID) | INTRAMUSCULAR | Status: DC | PRN
  Administered 2022-10-26: 4 mg via INTRAVENOUS
  Filled 2022-10-24: qty 2

## 2022-10-24 MED ORDER — THIAMINE HCL 100 MG/ML IJ SOLN
500.0000 mg | Freq: Three times a day (TID) | INTRAVENOUS | Status: DC
  Administered 2022-10-25 – 2022-10-26 (×5): 500 mg via INTRAVENOUS
  Filled 2022-10-24 (×8): qty 5
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
## 2022-10-24 NOTE — ED Notes (Signed)
Unable to obtain blood work at this time, Charity fundraiser aware

## 2022-10-24 NOTE — ED Triage Notes (Signed)
Pt here from home with c/o dizziness along with right wrist and right knee pain from a fall from being dizzy

## 2022-10-24 NOTE — ED Notes (Signed)
Pt provided with sandwich bag and something to drink.

## 2022-10-24 NOTE — ED Notes (Signed)
Pt states he is too dizzy at this time for orthostatic vitals.

## 2022-10-25 ENCOUNTER — Other Ambulatory Visit: Payer: Self-pay

## 2022-10-25 ENCOUNTER — Inpatient Hospital Stay (HOSPITAL_COMMUNITY): Payer: MEDICAID

## 2022-10-25 ENCOUNTER — Observation Stay (HOSPITAL_COMMUNITY): Payer: MEDICAID

## 2022-10-25 DIAGNOSIS — N179 Acute kidney failure, unspecified: Secondary | ICD-10-CM | POA: Diagnosis present

## 2022-10-25 DIAGNOSIS — S60911A Unspecified superficial injury of right wrist, initial encounter: Secondary | ICD-10-CM | POA: Diagnosis present

## 2022-10-25 DIAGNOSIS — F330 Major depressive disorder, recurrent, mild: Secondary | ICD-10-CM | POA: Diagnosis not present

## 2022-10-25 DIAGNOSIS — F1410 Cocaine abuse, uncomplicated: Secondary | ICD-10-CM | POA: Diagnosis present

## 2022-10-25 DIAGNOSIS — R296 Repeated falls: Secondary | ICD-10-CM | POA: Diagnosis present

## 2022-10-25 DIAGNOSIS — R7881 Bacteremia: Secondary | ICD-10-CM | POA: Diagnosis not present

## 2022-10-25 DIAGNOSIS — R42 Dizziness and giddiness: Secondary | ICD-10-CM | POA: Diagnosis present

## 2022-10-25 DIAGNOSIS — M549 Dorsalgia, unspecified: Secondary | ICD-10-CM | POA: Diagnosis present

## 2022-10-25 DIAGNOSIS — Z87891 Personal history of nicotine dependence: Secondary | ICD-10-CM | POA: Diagnosis not present

## 2022-10-25 DIAGNOSIS — F1123 Opioid dependence with withdrawal: Secondary | ICD-10-CM | POA: Diagnosis present

## 2022-10-25 DIAGNOSIS — Z5329 Procedure and treatment not carried out because of patient's decision for other reasons: Secondary | ICD-10-CM | POA: Diagnosis present

## 2022-10-25 DIAGNOSIS — Z6372 Alcoholism and drug addiction in family: Secondary | ICD-10-CM | POA: Diagnosis not present

## 2022-10-25 DIAGNOSIS — F1910 Other psychoactive substance abuse, uncomplicated: Secondary | ICD-10-CM | POA: Diagnosis not present

## 2022-10-25 DIAGNOSIS — S83411A Sprain of medial collateral ligament of right knee, initial encounter: Secondary | ICD-10-CM | POA: Diagnosis present

## 2022-10-25 DIAGNOSIS — R1310 Dysphagia, unspecified: Secondary | ICD-10-CM | POA: Diagnosis present

## 2022-10-25 DIAGNOSIS — W1830XA Fall on same level, unspecified, initial encounter: Secondary | ICD-10-CM | POA: Diagnosis present

## 2022-10-25 DIAGNOSIS — F419 Anxiety disorder, unspecified: Secondary | ICD-10-CM | POA: Diagnosis present

## 2022-10-25 DIAGNOSIS — Y929 Unspecified place or not applicable: Secondary | ICD-10-CM | POA: Diagnosis not present

## 2022-10-25 DIAGNOSIS — Z811 Family history of alcohol abuse and dependence: Secondary | ICD-10-CM | POA: Diagnosis not present

## 2022-10-25 DIAGNOSIS — F1010 Alcohol abuse, uncomplicated: Secondary | ICD-10-CM | POA: Diagnosis present

## 2022-10-25 DIAGNOSIS — G8929 Other chronic pain: Secondary | ICD-10-CM | POA: Diagnosis present

## 2022-10-25 DIAGNOSIS — B49 Unspecified mycosis: Secondary | ICD-10-CM | POA: Diagnosis not present

## 2022-10-25 DIAGNOSIS — E876 Hypokalemia: Secondary | ICD-10-CM | POA: Diagnosis present

## 2022-10-25 DIAGNOSIS — F1110 Opioid abuse, uncomplicated: Secondary | ICD-10-CM | POA: Diagnosis not present

## 2022-10-25 DIAGNOSIS — R55 Syncope and collapse: Secondary | ICD-10-CM | POA: Diagnosis present

## 2022-10-25 DIAGNOSIS — M25461 Effusion, right knee: Secondary | ICD-10-CM | POA: Diagnosis present

## 2022-10-25 LAB — CBC
HCT: 33.3 % — ABNORMAL LOW (ref 39.0–52.0)
Hemoglobin: 10.7 g/dL — ABNORMAL LOW (ref 13.0–17.0)
MCH: 27.4 pg (ref 26.0–34.0)
MCHC: 32.1 g/dL (ref 30.0–36.0)
MCV: 85.4 fL (ref 80.0–100.0)
Platelets: 196 10*3/uL (ref 150–400)
RBC: 3.9 MIL/uL — ABNORMAL LOW (ref 4.22–5.81)
RDW: 14.7 % (ref 11.5–15.5)
WBC: 9.1 10*3/uL (ref 4.0–10.5)
nRBC: 0 % (ref 0.0–0.2)

## 2022-10-25 LAB — RAPID URINE DRUG SCREEN, HOSP PERFORMED
Amphetamines: NOT DETECTED
Barbiturates: NOT DETECTED
Benzodiazepines: NOT DETECTED
Cocaine: POSITIVE — AB
Opiates: NOT DETECTED
Tetrahydrocannabinol: NOT DETECTED

## 2022-10-25 LAB — BASIC METABOLIC PANEL
Anion gap: 10 (ref 5–15)
BUN: 13 mg/dL (ref 6–20)
CO2: 22 mmol/L (ref 22–32)
Calcium: 8 mg/dL — ABNORMAL LOW (ref 8.9–10.3)
Chloride: 105 mmol/L (ref 98–111)
Creatinine, Ser: 1.75 mg/dL — ABNORMAL HIGH (ref 0.61–1.24)
GFR, Estimated: 46 mL/min — ABNORMAL LOW (ref >60)
Glucose, Bld: 114 mg/dL — ABNORMAL HIGH (ref 70–99)
Potassium: 3 mmol/L — ABNORMAL LOW (ref 3.5–5.1)
Sodium: 137 mmol/L (ref 135–145)

## 2022-10-25 LAB — PHOSPHORUS: Phosphorus: 4 mg/dL (ref 2.5–4.6)

## 2022-10-25 LAB — VITAMIN B12: Vitamin B-12: 281 pg/mL (ref 180–914)

## 2022-10-25 LAB — MAGNESIUM: Magnesium: 1.7 mg/dL (ref 1.7–2.4)

## 2022-10-25 MED ORDER — NALOXONE HCL 0.4 MG/ML IJ SOLN
0.4000 mg | INTRAMUSCULAR | Status: DC | PRN
  Filled 2022-10-25: qty 1

## 2022-10-25 MED ORDER — FLUTICASONE PROPIONATE 50 MCG/ACT NA SUSP
2.0000 | Freq: Every day | NASAL | Status: DC
  Administered 2022-10-25 – 2022-10-26 (×2): 2 via NASAL
  Filled 2022-10-25: qty 16

## 2022-10-25 MED ORDER — LORATADINE 10 MG PO TABS
10.0000 mg | ORAL_TABLET | Freq: Every day | ORAL | Status: DC
  Administered 2022-10-25 – 2022-10-26 (×2): 10 mg via ORAL
  Filled 2022-10-25 (×2): qty 1

## 2022-10-25 MED ORDER — MAGNESIUM SULFATE 2 GM/50ML IV SOLN
2.0000 g | Freq: Once | INTRAVENOUS | Status: AC
  Administered 2022-10-25: 2 g via INTRAVENOUS
  Filled 2022-10-25: qty 50

## 2022-10-25 MED ORDER — MORPHINE SULFATE (PF) 2 MG/ML IV SOLN
1.0000 mg | Freq: Once | INTRAVENOUS | Status: AC | PRN
  Administered 2022-10-25: 1 mg via INTRAVENOUS
  Filled 2022-10-25: qty 1

## 2022-10-25 MED ORDER — POTASSIUM CHLORIDE CRYS ER 20 MEQ PO TBCR
40.0000 meq | EXTENDED_RELEASE_TABLET | Freq: Once | ORAL | Status: AC
  Administered 2022-10-25: 40 meq via ORAL
  Filled 2022-10-25: qty 2

## 2022-10-25 MED ORDER — SODIUM CHLORIDE 0.9 % IV SOLN: INTRAVENOUS | Status: AC

## 2022-10-25 MED ORDER — IOHEXOL 350 MG/ML SOLN
75.0000 mL | Freq: Once | INTRAVENOUS | Status: AC | PRN
  Administered 2022-10-25: 75 mL via INTRAVENOUS

## 2022-10-25 MED ORDER — TRAMADOL HCL 50 MG PO TABS
50.0000 mg | ORAL_TABLET | Freq: Four times a day (QID) | ORAL | Status: DC | PRN
  Administered 2022-10-25 – 2022-10-26 (×2): 50 mg via ORAL
  Filled 2022-10-25 (×2): qty 1

## 2022-10-25 MED ORDER — LORAZEPAM 2 MG/ML IJ SOLN
1.0000 mg | INTRAMUSCULAR | Status: AC
  Administered 2022-10-25: 1 mg via INTRAVENOUS
  Filled 2022-10-25: qty 1

## 2022-10-25 MED ORDER — OXYCODONE HCL 5 MG PO TABS
2.5000 mg | ORAL_TABLET | Freq: Four times a day (QID) | ORAL | Status: DC | PRN
  Administered 2022-10-25: 2.5 mg via ORAL
  Filled 2022-10-25: qty 1

## 2022-10-25 MED ORDER — ORAL CARE MOUTH RINSE: 15.0000 mL | OROMUCOSAL | Status: DC | PRN

## 2022-10-25 MED ORDER — DICLOFENAC SODIUM 1 % EX GEL
2.0000 g | Freq: Four times a day (QID) | CUTANEOUS | Status: DC
  Administered 2022-10-25: 2 g via TOPICAL
  Filled 2022-10-25: qty 100
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
## 2022-10-25 NOTE — Progress Notes (Signed)
SLP Cancellation Note  Patient Details Name: Nicholas [name] MRN: [id number] DOB: 1971/01/27   Cancelled treatment:       Reason Eval/Treat Not Completed: Patient at procedure or test/unavailable. Patient at MRI. SLP will follow up when available.  Angela Nevin, MA, CCC-SLP Speech Therapy

## 2022-10-25 NOTE — ED Notes (Signed)
Pt transferred to inpatient unit at this time with all belongings.
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
## 2022-10-25 NOTE — Plan of Care (Signed)

## 2022-10-25 NOTE — ED Notes (Signed)
This RN attempted to call 5W to provide report.  Per floor, current assigned bed (Bed 19) is blocked.  Unit calling bed placement at this time.

## 2022-10-26 ENCOUNTER — Inpatient Hospital Stay (HOSPITAL_COMMUNITY)
Admission: EM | Admit: 2022-10-26 | Discharge: 2022-11-03 | DRG: 871 | Discharge status: Against Medical Advice | Payer: MEDICAID | Attending: Internal Medicine | Admitting: Internal Medicine

## 2022-10-26 ENCOUNTER — Other Ambulatory Visit: Payer: Self-pay

## 2022-10-26 ENCOUNTER — Emergency Department (HOSPITAL_COMMUNITY): Payer: MEDICAID

## 2022-10-26 ENCOUNTER — Encounter (HOSPITAL_COMMUNITY): Payer: Self-pay

## 2022-10-26 DIAGNOSIS — Z811 Family history of alcohol abuse and dependence: Secondary | ICD-10-CM

## 2022-10-26 DIAGNOSIS — F1910 Other psychoactive substance abuse, uncomplicated: Secondary | ICD-10-CM | POA: Diagnosis present

## 2022-10-26 DIAGNOSIS — E860 Dehydration: Principal | ICD-10-CM | POA: Diagnosis present

## 2022-10-26 DIAGNOSIS — F1113 Opioid abuse with withdrawal: Secondary | ICD-10-CM | POA: Diagnosis present

## 2022-10-26 DIAGNOSIS — F17210 Nicotine dependence, cigarettes, uncomplicated: Secondary | ICD-10-CM | POA: Diagnosis present

## 2022-10-26 DIAGNOSIS — N170 Acute kidney failure with tubular necrosis: Secondary | ICD-10-CM | POA: Diagnosis present

## 2022-10-26 DIAGNOSIS — F4310 Post-traumatic stress disorder, unspecified: Secondary | ICD-10-CM | POA: Diagnosis present

## 2022-10-26 DIAGNOSIS — M11261 Other chondrocalcinosis, right knee: Secondary | ICD-10-CM | POA: Diagnosis present

## 2022-10-26 DIAGNOSIS — T80818A Extravasation of other vesicant agent, initial encounter: Secondary | ICD-10-CM | POA: Diagnosis not present

## 2022-10-26 DIAGNOSIS — I253 Aneurysm of heart: Secondary | ICD-10-CM | POA: Diagnosis present

## 2022-10-26 DIAGNOSIS — Z634 Disappearance and death of family member: Secondary | ICD-10-CM

## 2022-10-26 DIAGNOSIS — F1410 Cocaine abuse, uncomplicated: Secondary | ICD-10-CM | POA: Diagnosis present

## 2022-10-26 DIAGNOSIS — T43215A Adverse effect of selective serotonin and norepinephrine reuptake inhibitors, initial encounter: Secondary | ICD-10-CM | POA: Diagnosis not present

## 2022-10-26 DIAGNOSIS — F1010 Alcohol abuse, uncomplicated: Secondary | ICD-10-CM | POA: Diagnosis present

## 2022-10-26 DIAGNOSIS — E538 Deficiency of other specified B group vitamins: Secondary | ICD-10-CM | POA: Diagnosis present

## 2022-10-26 DIAGNOSIS — T39395A Adverse effect of other nonsteroidal anti-inflammatory drugs [NSAID], initial encounter: Secondary | ICD-10-CM | POA: Diagnosis not present

## 2022-10-26 DIAGNOSIS — H8110 Benign paroxysmal vertigo, unspecified ear: Secondary | ICD-10-CM | POA: Diagnosis present

## 2022-10-26 DIAGNOSIS — B49 Unspecified mycosis: Secondary | ICD-10-CM | POA: Diagnosis present

## 2022-10-26 DIAGNOSIS — F329 Major depressive disorder, single episode, unspecified: Secondary | ICD-10-CM | POA: Diagnosis present

## 2022-10-26 DIAGNOSIS — Z8043 Family history of malignant neoplasm of testis: Secondary | ICD-10-CM

## 2022-10-26 DIAGNOSIS — I959 Hypotension, unspecified: Secondary | ICD-10-CM | POA: Diagnosis present

## 2022-10-26 DIAGNOSIS — G8929 Other chronic pain: Secondary | ICD-10-CM | POA: Diagnosis present

## 2022-10-26 DIAGNOSIS — Z6372 Alcoholism and drug addiction in family: Secondary | ICD-10-CM

## 2022-10-26 DIAGNOSIS — Q2112 Patent foramen ovale: Secondary | ICD-10-CM

## 2022-10-26 DIAGNOSIS — B1920 Unspecified viral hepatitis C without hepatic coma: Secondary | ICD-10-CM | POA: Diagnosis present

## 2022-10-26 DIAGNOSIS — G4700 Insomnia, unspecified: Secondary | ICD-10-CM | POA: Diagnosis not present

## 2022-10-26 DIAGNOSIS — R42 Dizziness and giddiness: Secondary | ICD-10-CM | POA: Diagnosis not present

## 2022-10-26 DIAGNOSIS — Z5329 Procedure and treatment not carried out because of patient's decision for other reasons: Secondary | ICD-10-CM | POA: Diagnosis present

## 2022-10-26 DIAGNOSIS — E861 Hypovolemia: Secondary | ICD-10-CM | POA: Diagnosis present

## 2022-10-26 DIAGNOSIS — N179 Acute kidney failure, unspecified: Secondary | ICD-10-CM | POA: Diagnosis present

## 2022-10-26 DIAGNOSIS — M549 Dorsalgia, unspecified: Secondary | ICD-10-CM | POA: Diagnosis present

## 2022-10-26 DIAGNOSIS — B377 Candidal sepsis: Principal | ICD-10-CM | POA: Diagnosis present

## 2022-10-26 DIAGNOSIS — R296 Repeated falls: Secondary | ICD-10-CM | POA: Diagnosis present

## 2022-10-26 DIAGNOSIS — H2513 Age-related nuclear cataract, bilateral: Secondary | ICD-10-CM | POA: Diagnosis present

## 2022-10-26 DIAGNOSIS — Z1152 Encounter for screening for COVID-19: Secondary | ICD-10-CM

## 2022-10-26 DIAGNOSIS — F1110 Opioid abuse, uncomplicated: Secondary | ICD-10-CM | POA: Diagnosis present

## 2022-10-26 DIAGNOSIS — R112 Nausea with vomiting, unspecified: Secondary | ICD-10-CM | POA: Diagnosis not present

## 2022-10-26 DIAGNOSIS — Z62810 Personal history of physical and sexual abuse in childhood: Secondary | ICD-10-CM

## 2022-10-26 LAB — COMPREHENSIVE METABOLIC PANEL
ALT: 16 U/L (ref 0–44)
AST: 17 U/L (ref 15–41)
Albumin: 4 g/dL (ref 3.5–5.0)
Alkaline Phosphatase: 70 U/L (ref 38–126)
Anion gap: 18 — ABNORMAL HIGH (ref 5–15)
BUN: 19 mg/dL (ref 6–20)
CO2: 21 mmol/L — ABNORMAL LOW (ref 22–32)
Calcium: 9.8 mg/dL (ref 8.9–10.3)
Chloride: 105 mmol/L (ref 98–111)
Creatinine, Ser: 2.42 mg/dL — ABNORMAL HIGH (ref 0.61–1.24)
GFR, Estimated: 31 mL/min — ABNORMAL LOW (ref >60)
Glucose, Bld: 117 mg/dL — ABNORMAL HIGH (ref 70–99)
Potassium: 5.1 mmol/L (ref 3.5–5.1)
Sodium: 144 mmol/L (ref 135–145)
Total Bilirubin: 0.4 mg/dL (ref 0.3–1.2)
Total Protein: 7.7 g/dL (ref 6.5–8.1)

## 2022-10-26 LAB — CBC WITH DIFFERENTIAL/PLATELET
Abs Immature Granulocytes: 0.11 10*3/uL — ABNORMAL HIGH (ref 0.00–0.07)
Basophils Absolute: 0.1 10*3/uL (ref 0.0–0.1)
Basophils Relative: 0 %
Eosinophils Absolute: 0 10*3/uL (ref 0.0–0.5)
Eosinophils Relative: 0 %
HCT: 43.8 % (ref 39.0–52.0)
Hemoglobin: 14.3 g/dL (ref 13.0–17.0)
Immature Granulocytes: 1 %
Lymphocytes Relative: 13 %
Lymphs Abs: 2 10*3/uL (ref 0.7–4.0)
MCH: 28.5 pg (ref 26.0–34.0)
MCHC: 32.6 g/dL (ref 30.0–36.0)
MCV: 87.4 fL (ref 80.0–100.0)
Monocytes Absolute: 1.2 10*3/uL — ABNORMAL HIGH (ref 0.1–1.0)
Monocytes Relative: 8 %
Neutro Abs: 12.4 10*3/uL — ABNORMAL HIGH (ref 1.7–7.7)
Neutrophils Relative %: 78 %
Platelets: 320 10*3/uL (ref 150–400)
RBC: 5.01 MIL/uL (ref 4.22–5.81)
RDW: 14.7 % (ref 11.5–15.5)
WBC: 15.7 10*3/uL — ABNORMAL HIGH (ref 4.0–10.5)
nRBC: 0 % (ref 0.0–0.2)

## 2022-10-26 LAB — APTT: aPTT: 30 s (ref 24–36)

## 2022-10-26 LAB — TROPONIN I (HIGH SENSITIVITY): Troponin I (High Sensitivity): 19 ng/L — ABNORMAL HIGH (ref <18)

## 2022-10-26 LAB — PROTIME-INR
INR: 1.2 (ref 0.8–1.2)
Prothrombin Time: 14.9 s (ref 11.4–15.2)

## 2022-10-26 LAB — RESP PANEL BY RT-PCR (RSV, FLU A&B, COVID)  RVPGX2
Influenza A by PCR: NEGATIVE
Influenza B by PCR: NEGATIVE
Resp Syncytial Virus by PCR: NEGATIVE
SARS Coronavirus 2 by RT PCR: NEGATIVE

## 2022-10-26 LAB — PROCALCITONIN: Procalcitonin: 0.33 ng/mL

## 2022-10-26 LAB — I-STAT CG4 LACTIC ACID, ED: Lactic Acid, Venous: 1.8 mmol/L (ref 0.5–1.9)

## 2022-10-26 LAB — ETHANOL: Alcohol, Ethyl (B): <10 mg/dL (ref <10)

## 2022-10-26 MED ORDER — CLONIDINE HCL 0.1 MG PO TABS
0.2000 mg | ORAL_TABLET | Freq: Four times a day (QID) | ORAL | Status: DC | PRN
  Administered 2022-10-26: 0.2 mg via ORAL
  Filled 2022-10-26: qty 2

## 2022-10-26 MED ORDER — VANCOMYCIN HCL IN DEXTROSE 1-5 GM/200ML-% IV SOLN: 1000.0000 mg | Freq: Once | INTRAVENOUS | Status: DC

## 2022-10-26 MED ORDER — LORAZEPAM 0.5 MG PO TABS
0.5000 mg | ORAL_TABLET | Freq: Four times a day (QID) | ORAL | Status: DC | PRN
  Administered 2022-10-26: 0.5 mg via ORAL
  Filled 2022-10-26: qty 1

## 2022-10-26 MED ORDER — VANCOMYCIN HCL 1500 MG/300ML IV SOLN
1500.0000 mg | Freq: Once | INTRAVENOUS | Status: AC
  Administered 2022-10-26: 1500 mg via INTRAVENOUS
  Filled 2022-10-26: qty 300

## 2022-10-26 MED ORDER — LACTATED RINGERS IV BOLUS (SEPSIS)
1000.0000 mL | Freq: Once | INTRAVENOUS | Status: AC
  Administered 2022-10-27: 1000 mL via INTRAVENOUS

## 2022-10-26 MED ORDER — METRONIDAZOLE 500 MG/100ML IV SOLN
500.0000 mg | Freq: Once | INTRAVENOUS | Status: AC
  Administered 2022-10-27: 500 mg via INTRAVENOUS
  Filled 2022-10-26: qty 100

## 2022-10-26 MED ORDER — LACTATED RINGERS IV BOLUS (SEPSIS)
1000.0000 mL | Freq: Once | INTRAVENOUS | Status: AC
  Administered 2022-10-26: 1000 mL via INTRAVENOUS

## 2022-10-26 MED ORDER — LACTATED RINGERS IV BOLUS (SEPSIS)
250.0000 mL | Freq: Once | INTRAVENOUS | Status: AC
  Administered 2022-10-26: 250 mL via INTRAVENOUS

## 2022-10-26 MED ORDER — LACTATED RINGERS IV SOLN: INTRAVENOUS | Status: AC

## 2022-10-26 MED ORDER — CLONIDINE HCL 0.1 MG PO TABS: 0.1000 mg | ORAL_TABLET | Freq: Four times a day (QID) | ORAL | Status: DC | PRN

## 2022-10-26 MED ORDER — SODIUM CHLORIDE 0.9 % IV SOLN
2.0000 g | Freq: Once | INTRAVENOUS | Status: AC
  Administered 2022-10-26: 2 g via INTRAVENOUS
  Filled 2022-10-26: qty 12.5

## 2022-10-26 NOTE — ED Notes (Signed)
Attempted once for second set of blood cultures. Unable to obtain specimen. Ndea, RN made aware.
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
## 2022-10-26 NOTE — Progress Notes (Signed)
ED Pharmacy Antibiotic Sign Off An antibiotic consult was received from an ED provider for vancomycin and cefepime per pharmacy dosing for sepsis. A chart review was completed to assess appropriateness.   The following one time order(s) were placed:  Vancomycin 1500 mg IV x 1 Cefepime 2g IV x 1  Further antibiotic and/or antibiotic pharmacy consults should be ordered by the admitting provider if indicated.   Thank you for allowing pharmacy to be a part of this patient's care.   Daylene Posey, [facility]  Clinical Pharmacist 10/26/22 8:30 PM

## 2022-10-26 NOTE — ED Provider Notes (Signed)
[place] EMERGENCY DEPARTMENT AT [facility] Provider Note   CSN: [id number] Arrival date & time: 10/26/22  2020     History  Chief Complaint  Patient presents with   Dehydration   Tachycardia    Nicholas [name] is a 52 y.o. male.  HPI   Patient has a history of chronic back pain opiate addiction alcohol abuse.  Patient initially presented to the ED on September 25, 2 days ago.  Patient presented with acute complaints of dizziness weakness vomiting.  Patient noted symptoms got worse with any movement.  Patient reported multiple falls over the last couple of days.  Patient reported that he was fall onto the right side.  Patient was noted to be positive for cocaine and had an elevated creatinine.  He was admitted to hospital for further treatment.  Initially plan to do an MRI of the brain but the patient reported the metallic object in his chest.  He was not a candidate for MRI.  He did have a CT angiogram of his head and neck that was unrevealing.  Patient was still receiving treatment in the hospital when he ended up leaving AMA this afternoon.  Patient states he left because he was frustrated that he was not getting better.  Patient returns this evening because he feels worse patient is feeling more lightheaded and dizzy.  No new falls or injury  Home Medications Prior to Admission medications   Medication Sig Start Date End Date Taking? Authorizing Provider  acetaminophen (TYLENOL) 500 MG tablet Take 1 tablet (500 mg total) by mouth every 6 (six) hours as needed. Please take every 6 hrs and stagger with Motrin 3 hrs apart from each other. Please do not exceed maximum daily dose to avoid liver damage 08/16/22   Ashok Croon, MD  chlorhexidine (PERIDEX) 0.12 % solution Use as directed 15 mLs in the mouth or throat 2 (two) times daily. Patient not taking: Reported on 10/24/2022 08/16/22   Ashok Croon, MD  ibuprofen (ADVIL) 600 MG tablet Take 1 tablet (600 mg total) by  mouth every 6 (six) hours as needed. Take every 6 hrs and stagger with Tylenol 3 hrs apart Patient not taking: Reported on 10/24/2022 08/16/22   Ashok Croon, MD  methocarbamol (ROBAXIN) 500 MG tablet Take 2 tablets (1,000 mg total) by mouth every 8 (eight) hours as needed for muscle spasms. Patient not taking: Reported on 10/24/2022 08/01/22   Juliet Rude, PA-C  mupirocin ointment Idelle Jo) 2 % Place 1 Application into the nose 2 (two) times daily for 5 days Patient not taking: Reported on 10/24/2022 08/01/22   Juliet Rude, PA-C  oxyCODONE (OXY IR/ROXICODONE) 5 MG immediate release tablet Take 1-2 tablets (5-10 mg total) by mouth every 4 (four) hours as needed for moderate pain or severe pain (5 mg for moderate, 10 mg for severe). Patient not taking: Reported on 10/24/2022 08/01/22   Juliet Rude, PA-C      Allergies    Patient has no known allergies.    Review of Systems   Review of Systems  Physical Exam Updated Vital Signs BP 106/72   Pulse 87   Temp 97.6 F (36.4 C) (Oral)   Resp 17   Ht 1.829 m (6')   Wt 68 kg   SpO2 96%   BMI 20.33 kg/m  Physical Exam Vitals and nursing note reviewed.  Constitutional:      Appearance: He is well-developed. He is ill-appearing.  HENT:  Head: Normocephalic and atraumatic.     Right Ear: External ear normal.     Left Ear: External ear normal.  Eyes:     General: No scleral icterus.       Right eye: No discharge.        Left eye: No discharge.     Conjunctiva/sclera: Conjunctivae normal.  Neck:     Trachea: No tracheal deviation.  Cardiovascular:     Rate and Rhythm: Regular rhythm. Tachycardia present.  Pulmonary:     Effort: Pulmonary effort is normal. No respiratory distress.     Breath sounds: Normal breath sounds. No stridor. No wheezing or rales.  Abdominal:     General: Bowel sounds are normal. There is no distension.     Palpations: Abdomen is soft.     Tenderness: There is no abdominal tenderness. There is no  guarding or rebound.  Musculoskeletal:        General: No tenderness or deformity.     Cervical back: Neck supple.  Skin:    General: Skin is warm and dry.     Findings: No rash.  Neurological:     General: No focal deficit present.     Mental Status: He is alert.     Cranial Nerves: No cranial nerve deficit, dysarthria or facial asymmetry.     Sensory: No sensory deficit.     Motor: No abnormal muscle tone or seizure activity.     Coordination: Coordination normal.  Psychiatric:        Mood and Affect: Mood normal.     ED Results / Procedures / Treatments   Labs (all labs ordered are listed, but only abnormal results are displayed) Labs Reviewed  COMPREHENSIVE METABOLIC PANEL - Abnormal; Notable for the following components:      Result Value   CO2 21 (*)    Glucose, Bld 117 (*)    Creatinine, Ser 2.42 (*)    GFR, Estimated 31 (*)    Anion gap 18 (*)    All other components within normal limits  CBC WITH DIFFERENTIAL/PLATELET - Abnormal; Notable for the following components:   WBC 15.7 (*)    Neutro Abs 12.4 (*)    Monocytes Absolute 1.2 (*)    Abs Immature Granulocytes 0.11 (*)    All other components within normal limits  TROPONIN I (HIGH SENSITIVITY) - Abnormal; Notable for the following components:   Troponin I (High Sensitivity) 19 (*)    All other components within normal limits  RESP PANEL BY RT-PCR (RSV, FLU A&B, COVID)  RVPGX2  CULTURE, BLOOD (ROUTINE X 2)  CULTURE, BLOOD (ROUTINE X 2)  ETHANOL  PROTIME-INR  APTT  RAPID URINE DRUG SCREEN, HOSP PERFORMED  URINALYSIS, W/ REFLEX TO CULTURE (INFECTION SUSPECTED)  I-STAT CG4 LACTIC ACID, ED  I-STAT CG4 LACTIC ACID, ED    EKG None  Radiology DG Chest Port 1 View  Result Date: 10/26/2022 CLINICAL DATA:  Possible sepsis.  Dehydration, tachycardia. EXAM: PORTABLE CHEST 1 VIEW COMPARISON:  07/31/2022. FINDINGS: The heart size and mediastinal contours are within normal limits. There is atherosclerotic  calcification of the aorta. No consolidation, effusion, or pneumothorax. No acute osseous abnormality. IMPRESSION: No active disease. Electronically Signed   By: Thornell Sartorius M.D.   On: 10/26/2022 20:54   CT ANGIO HEAD NECK W WO CM  Result Date: 10/25/2022 CLINICAL DATA:  Vertigo, peripheral EXAM: CT ANGIOGRAPHY HEAD AND NECK WITH AND WITHOUT CONTRAST TECHNIQUE: Multidetector CT imaging of the head and neck  was performed using the standard protocol during bolus administration of intravenous contrast. Multiplanar CT image reconstructions and MIPs were obtained to evaluate the vascular anatomy. Carotid stenosis measurements (when applicable) are obtained utilizing NASCET criteria, using the distal internal carotid diameter as the denominator. RADIATION DOSE REDUCTION: This exam was performed according to the departmental dose-optimization program which includes automated exposure control, adjustment of the mA and/or kV according to patient size and/or use of iterative reconstruction technique. CONTRAST:  75mL OMNIPAQUE IOHEXOL 350 MG/ML SOLN COMPARISON:  None Available. FINDINGS: CT HEAD FINDINGS Brain: No evidence of acute infarction, hemorrhage, hydrocephalus, extra-axial collection or mass lesion/mass effect. Vascular: See below. Skull: No acute fracture. Sinuses/Orbits: Mild paranasal sinus mucosal thickening. No acute orbital findings. Other: No mastoid effusions. Review of the MIP images confirms the above findings CTA NECK FINDINGS Aortic arch: Great vessel origins are patent without significant stenosis. Right carotid system: No evidence of dissection, stenosis (50% or greater), or occlusion. Left carotid system: No evidence of dissection, stenosis (50% or greater), or occlusion. Vertebral arteries: Left dominant. No evidence of dissection, stenosis (50% or greater), or occlusion. Skeleton: No acute abnormality on limited assessment. Other neck: No acute abnormality on limited assessment. Upper chest:  Paraseptal emphysema. Review of the MIP images confirms the above findings CTA HEAD FINDINGS Anterior circulation: Bilateral intracranial ICAs, MCAs, and ACAs are patent without proximal hemodynamically significant stenosis. Posterior circulation: Bilateral intradural vertebral arteries, basilar artery and bilateral posterior cerebral arteries are patent without proximal hemodynamically significant stenosis. Venous sinuses: As permitted by contrast timing, patent. Review of the MIP images confirms the above findings IMPRESSION: 1. No evidence of acute intracranial abnormality. 2. No emergent large vessel occlusion or proximal hemodynamically significant stenosis. Electronically Signed   By: Feliberto Harts M.D.   On: 10/25/2022 18:21    Procedures .Critical Care  Performed by: Linwood Dibbles, MD Authorized by: Linwood Dibbles, MD   Critical care provider statement:    Critical care time (minutes):  30   Critical care was time spent personally by me on the following activities:  Development of treatment plan with patient or surrogate, discussions with consultants, evaluation of patient's response to treatment, examination of patient, ordering and review of laboratory studies, ordering and review of radiographic studies, ordering and performing treatments and interventions, pulse oximetry, re-evaluation of patient's condition and review of old charts     Medications Ordered in ED Medications  lactated ringers infusion ( Intravenous New Bag/Given 10/26/22 2056)  lactated ringers bolus 1,000 mL (1,000 mLs Intravenous New Bag/Given 10/26/22 2054)    And  lactated ringers bolus 1,000 mL (has no administration in time range)    And  lactated ringers bolus 250 mL (250 mLs Intravenous New Bag/Given 10/26/22 2132)  metroNIDAZOLE (FLAGYL) IVPB 500 mg (has no administration in time range)  vancomycin (VANCOREADY) IVPB 1500 mg/300 mL (1,500 mg Intravenous New Bag/Given 10/26/22 2131)  ceFEPIme (MAXIPIME) 2 g in  sodium chloride 0.9 % 100 mL IVPB (0 g Intravenous Stopped 10/26/22 2130)    ED Course/ Medical Decision Making/ A&P Clinical Course as of 10/26/22 2217  Fri Oct 26, 2022  2144 Metabolic panel shows increasing creatinine.  White blood cell count elevated at 15 [JK]  2145 Troponin increased at 19.  Lactic acid level not elevated. [JK]  2145 Chest x-ray without acute finding [JK]  2145 Pressure is now improved to low 100s.  Suspect his tachycardia and hypotension is related to dehydration and not acute infection at this time [JK]  Clinical Course User Index [JK] Linwood Dibbles, MD                                 Medical Decision Making Patient noted to be hypotensive and tachycardic on arrival.  Sepsis protocol initiated.  Patient also may be dehydrated have acute renal failure.  Will proceed with IV fluid boluses close monitoring  Problems Addressed: AKI (acute kidney injury) (HCC): acute illness or injury that poses a threat to life or bodily functions Dehydration: acute illness or injury that poses a threat to life or bodily functions  Amount and/or Complexity of Data Reviewed Labs: ordered. Decision-making details documented in ED Course. Radiology: ordered and independent interpretation performed. ECG/medicine tests: ordered.  Risk Prescription drug management.   Pt presented with recurrent hypotension,.  Pt just left the hospital leaving AMA.  Pt noted to be hypotensive and tachycardic on arrival.  Considered the possibility of evolving sepsis though no signs of infection at this time.  No lactic acidosis.  Patient does have elevated creatinine suggesting worsening AKI dehydration as the source of his initial hypotension.  Patient's blood pressure has improved.  No vomiting noted in the ED today.  He declines any recent drug use but that is a concern and certainly with his history of prior substance abuse problems.  Will continue with IV hydration plan on admission to the hospital  for further treatment.  Case discussed with Dr. Julian Reil        Final Clinical Impression(s) / ED Diagnoses Final diagnoses:  Dehydration  AKI (acute kidney injury) [facility])    Rx / DC Orders ED Discharge Orders     None         Linwood Dibbles, MD 10/26/22 2218

## 2022-10-26 NOTE — Progress Notes (Signed)
Patient found in hallway with telemetry and IV removed by patient. Patient states he is leaving the hospital. Patient alert and oriented. Patient refused to sign AMA paper and refused to wait for MD to come speak with him.   MD paged by primary RN.   Patient walked to main entrance with staff. Patient called ride to pick him up.

## 2022-10-26 NOTE — Progress Notes (Signed)
Patient found in hallway with telemetry and IV removed by patient at 1230 PM. No bleeding at site.Patient states he is leaving the hospital. Patient alert and oriented. Patient refused to sign AMA paper and refused to wait for MD to come speak with him.  Patient walked to main entrance with the charge nurse.   MD paged by RN.

## 2022-10-26 NOTE — Plan of Care (Signed)
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
## 2022-10-26 NOTE — Sepsis Progress Note (Signed)
Elink monitoring for the code sepsis protocol.  

## 2022-10-27 ENCOUNTER — Other Ambulatory Visit: Payer: Self-pay

## 2022-10-27 DIAGNOSIS — Z5329 Procedure and treatment not carried out because of patient's decision for other reasons: Secondary | ICD-10-CM | POA: Diagnosis present

## 2022-10-27 DIAGNOSIS — E860 Dehydration: Secondary | ICD-10-CM | POA: Diagnosis present

## 2022-10-27 DIAGNOSIS — F1410 Cocaine abuse, uncomplicated: Secondary | ICD-10-CM | POA: Diagnosis present

## 2022-10-27 DIAGNOSIS — F1010 Alcohol abuse, uncomplicated: Secondary | ICD-10-CM | POA: Diagnosis present

## 2022-10-27 DIAGNOSIS — Z1152 Encounter for screening for COVID-19: Secondary | ICD-10-CM | POA: Diagnosis not present

## 2022-10-27 DIAGNOSIS — B377 Candidal sepsis: Secondary | ICD-10-CM | POA: Diagnosis present

## 2022-10-27 DIAGNOSIS — F1110 Opioid abuse, uncomplicated: Secondary | ICD-10-CM | POA: Diagnosis not present

## 2022-10-27 DIAGNOSIS — Q2112 Patent foramen ovale: Secondary | ICD-10-CM | POA: Diagnosis not present

## 2022-10-27 DIAGNOSIS — N179 Acute kidney failure, unspecified: Secondary | ICD-10-CM | POA: Diagnosis not present

## 2022-10-27 DIAGNOSIS — B1920 Unspecified viral hepatitis C without hepatic coma: Secondary | ICD-10-CM | POA: Diagnosis present

## 2022-10-27 DIAGNOSIS — E538 Deficiency of other specified B group vitamins: Secondary | ICD-10-CM | POA: Diagnosis present

## 2022-10-27 DIAGNOSIS — F329 Major depressive disorder, single episode, unspecified: Secondary | ICD-10-CM | POA: Diagnosis present

## 2022-10-27 DIAGNOSIS — F4310 Post-traumatic stress disorder, unspecified: Secondary | ICD-10-CM | POA: Diagnosis present

## 2022-10-27 DIAGNOSIS — T80818A Extravasation of other vesicant agent, initial encounter: Secondary | ICD-10-CM | POA: Diagnosis not present

## 2022-10-27 DIAGNOSIS — F1910 Other psychoactive substance abuse, uncomplicated: Secondary | ICD-10-CM | POA: Diagnosis not present

## 2022-10-27 DIAGNOSIS — F1113 Opioid abuse with withdrawal: Secondary | ICD-10-CM | POA: Diagnosis present

## 2022-10-27 DIAGNOSIS — R7881 Bacteremia: Secondary | ICD-10-CM | POA: Diagnosis not present

## 2022-10-27 DIAGNOSIS — B49 Unspecified mycosis: Secondary | ICD-10-CM | POA: Diagnosis not present

## 2022-10-27 DIAGNOSIS — I253 Aneurysm of heart: Secondary | ICD-10-CM | POA: Diagnosis present

## 2022-10-27 DIAGNOSIS — N170 Acute kidney failure with tubular necrosis: Secondary | ICD-10-CM | POA: Diagnosis present

## 2022-10-27 DIAGNOSIS — E861 Hypovolemia: Secondary | ICD-10-CM | POA: Diagnosis present

## 2022-10-27 DIAGNOSIS — G4700 Insomnia, unspecified: Secondary | ICD-10-CM | POA: Diagnosis not present

## 2022-10-27 DIAGNOSIS — R296 Repeated falls: Secondary | ICD-10-CM | POA: Diagnosis present

## 2022-10-27 DIAGNOSIS — M11261 Other chondrocalcinosis, right knee: Secondary | ICD-10-CM | POA: Diagnosis present

## 2022-10-27 DIAGNOSIS — H2513 Age-related nuclear cataract, bilateral: Secondary | ICD-10-CM | POA: Diagnosis present

## 2022-10-27 DIAGNOSIS — Z62810 Personal history of physical and sexual abuse in childhood: Secondary | ICD-10-CM | POA: Diagnosis not present

## 2022-10-27 DIAGNOSIS — F330 Major depressive disorder, recurrent, mild: Secondary | ICD-10-CM | POA: Diagnosis not present

## 2022-10-27 DIAGNOSIS — Z6372 Alcoholism and drug addiction in family: Secondary | ICD-10-CM | POA: Diagnosis not present

## 2022-10-27 DIAGNOSIS — Z634 Disappearance and death of family member: Secondary | ICD-10-CM | POA: Diagnosis not present

## 2022-10-27 DIAGNOSIS — F17210 Nicotine dependence, cigarettes, uncomplicated: Secondary | ICD-10-CM | POA: Diagnosis present

## 2022-10-27 LAB — BASIC METABOLIC PANEL
Anion gap: 8 (ref 5–15)
BUN: 28 mg/dL — ABNORMAL HIGH (ref 6–20)
CO2: 22 mmol/L (ref 22–32)
Calcium: 8.2 mg/dL — ABNORMAL LOW (ref 8.9–10.3)
Chloride: 107 mmol/L (ref 98–111)
Creatinine, Ser: 2.22 mg/dL — ABNORMAL HIGH (ref 0.61–1.24)
GFR, Estimated: 34 mL/min — ABNORMAL LOW (ref >60)
Glucose, Bld: 103 mg/dL — ABNORMAL HIGH (ref 70–99)
Potassium: 3.9 mmol/L (ref 3.5–5.1)
Sodium: 137 mmol/L (ref 135–145)

## 2022-10-27 LAB — RAPID URINE DRUG SCREEN, HOSP PERFORMED
Amphetamines: NOT DETECTED
Barbiturates: NOT DETECTED
Benzodiazepines: NOT DETECTED
Cocaine: POSITIVE — AB
Opiates: POSITIVE — AB
Tetrahydrocannabinol: NOT DETECTED

## 2022-10-27 LAB — CBC
HCT: 35.4 % — ABNORMAL LOW (ref 39.0–52.0)
Hemoglobin: 11.1 g/dL — ABNORMAL LOW (ref 13.0–17.0)
MCH: 27.2 pg (ref 26.0–34.0)
MCHC: 31.4 g/dL (ref 30.0–36.0)
MCV: 86.8 fL (ref 80.0–100.0)
Platelets: 210 10*3/uL (ref 150–400)
RBC: 4.08 MIL/uL — ABNORMAL LOW (ref 4.22–5.81)
RDW: 14.9 % (ref 11.5–15.5)
WBC: 10.3 10*3/uL (ref 4.0–10.5)
nRBC: 0 % (ref 0.0–0.2)

## 2022-10-27 LAB — URINALYSIS, W/ REFLEX TO CULTURE (INFECTION SUSPECTED)
Bilirubin Urine: NEGATIVE
Glucose, UA: 50 mg/dL — AB
Hgb urine dipstick: NEGATIVE
Ketones, ur: 5 mg/dL — AB
Leukocytes,Ua: NEGATIVE
Nitrite: NEGATIVE
Protein, ur: 30 mg/dL — AB
Specific Gravity, Urine: 1.013 (ref 1.005–1.030)
pH: 7 (ref 5.0–8.0)

## 2022-10-27 LAB — VITAMIN B1: Vitamin B1 (Thiamine): 106.2 nmol/L (ref 66.5–200.0)

## 2022-10-27 MED ORDER — ENOXAPARIN SODIUM 40 MG/0.4ML IJ SOSY
40.0000 mg | PREFILLED_SYRINGE | INTRAMUSCULAR | Status: DC
  Administered 2022-10-27 – 2022-11-03 (×8): 40 mg via SUBCUTANEOUS
  Filled 2022-10-27 (×8): qty 0.4

## 2022-10-27 MED ORDER — HYDROXYZINE HCL 25 MG PO TABS
25.0000 mg | ORAL_TABLET | Freq: Four times a day (QID) | ORAL | Status: AC | PRN
  Administered 2022-10-27 – 2022-10-31 (×8): 25 mg via ORAL
  Filled 2022-10-27 (×8): qty 1

## 2022-10-27 MED ORDER — ONDANSETRON HCL 4 MG PO TABS: 4.0000 mg | ORAL_TABLET | Freq: Four times a day (QID) | ORAL | Status: DC | PRN

## 2022-10-27 MED ORDER — LORAZEPAM 1 MG PO TABS
1.0000 mg | ORAL_TABLET | ORAL | Status: AC | PRN
  Administered 2022-10-29: 1 mg via ORAL
  Filled 2022-10-27: qty 1
  Filled 2022-10-27: qty 2

## 2022-10-27 MED ORDER — CLONIDINE HCL 0.1 MG PO TABS
0.1000 mg | ORAL_TABLET | ORAL | Status: AC
  Administered 2022-10-29 – 2022-10-30 (×4): 0.1 mg via ORAL
  Filled 2022-10-27 (×4): qty 1

## 2022-10-27 MED ORDER — ACETAMINOPHEN 650 MG RE SUPP: 650.0000 mg | Freq: Four times a day (QID) | RECTAL | Status: DC | PRN

## 2022-10-27 MED ORDER — ACETAMINOPHEN 325 MG PO TABS
650.0000 mg | ORAL_TABLET | Freq: Four times a day (QID) | ORAL | Status: DC | PRN
  Administered 2022-10-27 – 2022-10-31 (×2): 650 mg via ORAL
  Filled 2022-10-27 (×2): qty 2

## 2022-10-27 MED ORDER — ONDANSETRON 4 MG PO TBDP: 4.0000 mg | ORAL_TABLET | Freq: Four times a day (QID) | ORAL | Status: DC | PRN

## 2022-10-27 MED ORDER — ADULT MULTIVITAMIN W/MINERALS CH
1.0000 | ORAL_TABLET | Freq: Every day | ORAL | Status: DC
  Administered 2022-10-27 – 2022-11-03 (×7): 1 via ORAL
  Filled 2022-10-27 (×8): qty 1

## 2022-10-27 MED ORDER — METHOCARBAMOL 500 MG PO TABS
500.0000 mg | ORAL_TABLET | Freq: Three times a day (TID) | ORAL | Status: AC | PRN
  Administered 2022-10-27 – 2022-10-30 (×4): 500 mg via ORAL
  Filled 2022-10-27 (×4): qty 1

## 2022-10-27 MED ORDER — THIAMINE HCL 100 MG/ML IJ SOLN
100.0000 mg | Freq: Every day | INTRAMUSCULAR | Status: DC
  Filled 2022-10-27: qty 2

## 2022-10-27 MED ORDER — LORAZEPAM 1 MG PO TABS
1.0000 mg | ORAL_TABLET | Freq: Four times a day (QID) | ORAL | Status: AC | PRN
  Administered 2022-10-29: 1 mg via ORAL

## 2022-10-27 MED ORDER — FOLIC ACID 1 MG PO TABS
1.0000 mg | ORAL_TABLET | Freq: Every day | ORAL | Status: DC
  Administered 2022-10-27 – 2022-11-03 (×7): 1 mg via ORAL
  Filled 2022-10-27 (×8): qty 1

## 2022-10-27 MED ORDER — ONDANSETRON HCL 4 MG/2ML IJ SOLN
4.0000 mg | Freq: Four times a day (QID) | INTRAMUSCULAR | Status: DC | PRN
  Administered 2022-11-02 (×2): 4 mg via INTRAVENOUS
  Filled 2022-10-27 (×2): qty 2

## 2022-10-27 MED ORDER — CLONIDINE HCL 0.1 MG PO TABS
0.1000 mg | ORAL_TABLET | Freq: Four times a day (QID) | ORAL | Status: AC
  Administered 2022-10-27 – 2022-10-28 (×8): 0.1 mg via ORAL
  Filled 2022-10-27 (×7): qty 1

## 2022-10-27 MED ORDER — DICYCLOMINE HCL 20 MG PO TABS: 20.0000 mg | ORAL_TABLET | Freq: Four times a day (QID) | ORAL | Status: AC | PRN

## 2022-10-27 MED ORDER — NAPROXEN 250 MG PO TABS
500.0000 mg | ORAL_TABLET | Freq: Two times a day (BID) | ORAL | Status: DC | PRN
  Administered 2022-10-27 – 2022-10-30 (×3): 500 mg via ORAL
  Filled 2022-10-27 (×5): qty 2

## 2022-10-27 MED ORDER — LOPERAMIDE HCL 2 MG PO CAPS: 2.0000 mg | ORAL_CAPSULE | ORAL | Status: AC | PRN

## 2022-10-27 MED ORDER — CLONIDINE HCL 0.1 MG PO TABS
0.1000 mg | ORAL_TABLET | Freq: Every day | ORAL | Status: AC
  Administered 2022-10-31 – 2022-11-01 (×2): 0.1 mg via ORAL
  Filled 2022-10-27 (×2): qty 1

## 2022-10-27 MED ORDER — THIAMINE MONONITRATE 100 MG PO TABS
100.0000 mg | ORAL_TABLET | Freq: Every day | ORAL | Status: DC
  Administered 2022-10-27 – 2022-11-03 (×7): 100 mg via ORAL
  Filled 2022-10-27 (×8): qty 1

## 2022-10-27 NOTE — ED Notes (Signed)
PT c/o of left sided face pain, did not ask for any pain medication at this time

## 2022-10-27 NOTE — Assessment & Plan Note (Signed)
Not clear if this is just historical or ongoing. BAL today is < 10.  Actually I dont see where he's been positive for EtOH in his system since a visit 12 years ago (all other visits have been negative.

## 2022-10-27 NOTE — Assessment & Plan Note (Addendum)
Ongoing abuse, note that he got no opiates during hospital stay over the past day, but it appears his UDS has gone from being negative on 9/26 to being positive for opiates tonight 9/27 following him leaving AMA for 8h earlier in the evening. Clonidine per protocol if he develops withdrawal symptoms Will hold off on ordering this for now though given soft BPs in ED. Didn't sound like he was a great candidate for suboxone (given that he's already abusing suboxone per yesterday provider note). But I will defer the ultimate decision on suboxone to day team.

## 2022-10-27 NOTE — Progress Notes (Signed)
PT Cancellation Note  Patient Details Name: Nicholas [name] MRN: [id number] DOB: 04/12/1970   Cancelled Treatment:    Reason Eval/Treat Not Completed: Pain limiting ability to participate; initiated session then pt declined vertigo assessment/treatment due to pain on R side of his face.  RN reports had Tylenol earlier.  Encouraged pt to participate as it is limiting his walking, but continued to decline due to pain.  Will attempt another day.   Elray Mcgregor 10/27/2022, 3:59 PM Sheran Lawless, PT Acute Rehabilitation Services Office:[phone] 10/27/2022

## 2022-10-27 NOTE — ED Notes (Signed)
PT was pushing buttons on the IV machine because it was making noise and I asked him to not touch the buttons.I  also asked him not to curse at me
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
## 2022-10-27 NOTE — Plan of Care (Signed)

## 2022-10-27 NOTE — Progress Notes (Signed)
Pt admitted from ED with dizziness and falls. Alert & oriented x 4. Vital signs stable. No complaints voiced at this time, oriented to room and call light. See flowsheet for detailed assessment and MAR for meds. Pt resting quietly in bed, call light in reach. Will continue with plan of care.

## 2022-10-27 NOTE — Assessment & Plan Note (Signed)
Pt with worsening AKI in setting of presenting to ED today with initial hypotension. Initially EDP worried about sepsis; however, now seems more suspicious that he's either dehydrated or had hypotension secondary to something he ingested after leaving hospital AMA yesterday. BP improved with IVF Got single doses of rocephin, vanc, cefepime while in ED Holding off on further ABx as sepsis seeming less likely. Cont IVF BCx pending, no obvious infectious source at this point Procalcitonin 0.33
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
## 2022-10-27 NOTE — Progress Notes (Signed)
PROGRESS NOTE        PATIENT DETAILS Name: Nicholas [name] Age: 52 y.o. Sex: male Date of Birth: 08-27-1970 Admit Date: 10/26/2022 Admitting Physician Hillary Bow, DO PCP:Pcp, No  Brief Summary: Patient is a 52 y.o.  male with history of polysubstance abuse (heroin/Suboxone/oral narcotics/EtOH use)-who signed out AMA on 9/27 after presenting with vertigo-presented back to the ED on 9/27 evening with vertigo and just not feeling well.  Acknowledges taking a "pain pill" after he left the hospital AMA.  Significant events: 9/26-9/27>> hospitalized for evaluation of vertigo-left AMA 9/27>> readmit to [facility]  Significant studies: 9/25>> x-ray right wrist: No acute fracture 9/25>> x-ray right knee: No fracture 9/26>> CT angio head/neck: No LVO or hemodynamically significant stenosis. 9/26>> UDS:+ Cocaine 9/27>> UDS:+ Cocaine/opiates (acknowledges taking a pain pill after he signed out AMA) 9/27>> CXR: No PNA   Significant microbiology data: 9/27>> blood culture: Negative 9/27>> COVID/influenza/RSV PCR: Negative  Procedures: None  Consults: None  Subjective: Lying comfortably in bed-denies any chest pain or shortness of breath.  Complains of vertigo-worse when lying flat, and when moving head side-to-side.  Objective: Vitals: Blood pressure 117/82, pulse 88, temperature 97.9 F (36.6 C), temperature source Oral, resp. rate 18, height 6' (1.829 m), weight 68 kg, SpO2 96%.   Exam: Gen Exam:Alert awake-not in any distress HEENT:atraumatic, normocephalic Chest: B/L clear to auscultation anteriorly CVS:S1S2 regular Abdomen:soft non tender, non distended Extremities:no edema Neurology: Non focal Skin: no rash  Pertinent Labs/Radiology:    Latest Ref Rng & Units 10/27/2022    4:42 AM 10/26/2022    8:22 PM 10/25/2022   12:10 AM  CBC  WBC 4.0 - 10.5 K/uL 10.3  15.7  9.1   Hemoglobin 13.0 - 17.0 g/dL 78.2  95.6  21.3   Hematocrit 39.0 - 52.0 % 35.4   43.8  33.3   Platelets 150 - 400 K/uL 210  320  196     Lab Results  Component Value Date   NA 137 10/27/2022   K 3.9 10/27/2022   CL 107 10/27/2022   CO2 22 10/27/2022      Assessment/Plan: Hypotension Likely due to hypovolemia He was briefly hypotensive on initial presentation but has since stabilized Sepsis  ruled out.  Vertigo Concern for BPPV-as some of it appears positional As needed meclizine Vestibular PT Reviewed prior progress note from Dr. Raeanne Gathers to obtain MRI due to metallic object embedded in his chest.  AKI Suspect this is hemodynamically mediated Minimal proteinuria Gently hydrate Avoid nephrotoxic agents Recheck electrolytes-if no improvement-will consider further workup.  Polysubstance abuse EtOH use Acknowledges doing cocaine-pain pills and doing heroin when he cannot get pain pills (apparently addicted to narcotics since recent trauma resulting in jaw fracture several months ago) Clonidine withdrawal protocol initiated Ativan per [facility]WA protocol Supportive care   BMI: Estimated body mass index is 20.33 kg/m as calculated from the following:   Height as of this encounter: 6' (1.829 m).   Weight as of this encounter: 68 kg.   Code status:   Code Status: Full Code   DVT Prophylaxis: enoxaparin (LOVENOX) injection 40 mg Start: 10/27/22 1400   Family Communication: None at bedside   Disposition Plan: Status is: Observation The patient will require care spanning > 2 midnights and should be moved to inpatient because: Severity of illness   Planned Discharge Destination:Home  Diet: Diet Order             Diet Heart Room service appropriate? Yes; Fluid consistency: Thin  Diet effective now                     Antimicrobial agents: Anti-infectives (From admission, onward)    Start     Dose/Rate Route Frequency Ordered Stop   10/26/22 2045  vancomycin (VANCOREADY) IVPB 1500 mg/300 mL        1,500 mg 150 mL/hr over 120  Minutes Intravenous  Once 10/26/22 2030 10/27/22 0120   10/26/22 2030  ceFEPIme (MAXIPIME) 2 g in sodium chloride 0.9 % 100 mL IVPB        2 g 200 mL/hr over 30 Minutes Intravenous  Once 10/26/22 2028 10/26/22 2130   10/26/22 2030  metroNIDAZOLE (FLAGYL) IVPB 500 mg        500 mg 100 mL/hr over 60 Minutes Intravenous  Once 10/26/22 2028 10/27/22 0259   10/26/22 2030  vancomycin (VANCOCIN) IVPB 1000 mg/200 mL premix  Status:  Discontinued        1,000 mg 200 mL/hr over 60 Minutes Intravenous  Once 10/26/22 2028 10/26/22 2030        MEDICATIONS: Scheduled Meds:  cloNIDine  0.1 mg Oral QID   Followed by   Melene Muller ON 10/29/2022] cloNIDine  0.1 mg Oral BH-qamhs   Followed by   Melene Muller ON 10/31/2022] cloNIDine  0.1 mg Oral QAC breakfast   enoxaparin (LOVENOX) injection  40 mg Subcutaneous Q24H   Continuous Infusions:  lactated ringers 150 mL/hr at 10/27/22 0543   PRN Meds:.acetaminophen **OR** acetaminophen, dicyclomine, hydrOXYzine, loperamide, methocarbamol, naproxen, ondansetron **OR** ondansetron (ZOFRAN) IV   I have personally reviewed following labs and imaging studies  LABORATORY DATA: CBC: Recent Labs  Lab 10/24/22 1819 10/25/22 0010 10/26/22 2022 10/27/22 0442  WBC 11.9* 9.1 15.7* 10.3  NEUTROABS  --   --  12.4*  --   HGB 12.2* 10.7* 14.3 11.1*  HCT 38.1* 33.3* 43.8 35.4*  MCV 87.0 85.4 87.4 86.8  PLT 238 196 320 210    Basic Metabolic Panel: Recent Labs  Lab 10/24/22 1819 10/24/22 2117 10/25/22 0010 10/26/22 2022 10/27/22 0442  NA 140  --  137 144 137  K 3.4*  --  3.0* 5.1 3.9  CL 104  --  105 105 107  CO2 25  --  22 21* 22  GLUCOSE 84  --  114* 117* 103*  BUN 11  --  13 19 28*  CREATININE 1.95*  --  1.75* 2.42* 2.22*  CALCIUM 8.8*  --  8.0* 9.8 8.2*  MG  --  2.0 1.7  --   --   PHOS  --   --  4.0  --   --     GFR: Estimated Creatinine Clearance: 36.6 mL/min (A) (by C-G formula based on SCr of 2.22 mg/dL (H)).  Liver Function Tests: Recent Labs   Lab 10/24/22 1819 10/26/22 2022  AST 16 17  ALT 15 16  ALKPHOS 68 70  BILITOT 0.5 0.4  PROT 6.8 7.7  ALBUMIN 3.6 4.0   No results for input(s): "LIPASE", "AMYLASE" in the last 168 hours. No results for input(s): "AMMONIA" in the last 168 hours.  Coagulation Profile: Recent Labs  Lab 10/26/22 2045  INR 1.2    Cardiac Enzymes: No results for input(s): "CKTOTAL", "CKMB", "CKMBINDEX", "TROPONINI" in the last 168 hours.  BNP (last 3 results) No results for input(s): "  PROBNP" in the last 8760 hours.  Lipid Profile: No results for input(s): "CHOL", "HDL", "LDLCALC", "TRIG", "CHOLHDL", "LDLDIRECT" in the last 72 hours.  Thyroid Function Tests: No results for input(s): "TSH", "T4TOTAL", "FREET4", "T3FREE", "THYROIDAB" in the last 72 hours.  Anemia Panel: Recent Labs    10/25/22 0010  VITAMINB12 281    Urine analysis:    Component Value Date/Time   COLORURINE YELLOW 10/27/2022 0147   APPEARANCEUR CLEAR 10/27/2022 0147   LABSPEC 1.013 10/27/2022 0147   PHURINE 7.0 10/27/2022 0147   GLUCOSEU 50 (A) 10/27/2022 0147   HGBUR NEGATIVE 10/27/2022 0147   BILIRUBINUR NEGATIVE 10/27/2022 0147   KETONESUR 5 (A) 10/27/2022 0147   PROTEINUR 30 (A) 10/27/2022 0147   UROBILINOGEN 0.2 02/01/2012 1041   NITRITE NEGATIVE 10/27/2022 0147   LEUKOCYTESUR NEGATIVE 10/27/2022 0147    Sepsis Labs: Lactic Acid, Venous    Component Value Date/Time   LATICACIDVEN 1.8 10/26/2022 2034    MICROBIOLOGY: Recent Results (from the past 240 hour(s))  Blood Culture (routine x 2)     Status: None (Preliminary result)   Collection Time: 10/26/22  8:45 PM   Specimen: BLOOD  Result Value Ref Range Status   Specimen Description BLOOD SITE NOT SPECIFIED  Final   Special Requests   Final    BOTTLES DRAWN AEROBIC AND ANAEROBIC Blood Culture adequate volume   Culture   Final    NO GROWTH < 12 HOURS Performed at [facility], [address] [place], [place] [address]    Report Status  PENDING  Incomplete  Blood Culture (routine x 2)     Status: None (Preliminary result)   Collection Time: 10/26/22  8:50 PM   Specimen: BLOOD  Result Value Ref Range Status   Specimen Description BLOOD SITE NOT SPECIFIED  Final   Special Requests   Final    BOTTLES DRAWN AEROBIC AND ANAEROBIC Blood Culture results may not be optimal due to an inadequate volume of blood received in culture bottles   Culture   Final    NO GROWTH < 12 HOURS Performed at [facility], [address] [place], [place] [address]    Report Status PENDING  Incomplete  Resp panel by RT-PCR (RSV, Flu A&B, Covid) Anterior Nasal Swab     Status: None   Collection Time: 10/26/22  9:55 PM   Specimen: Anterior Nasal Swab  Result Value Ref Range Status   SARS Coronavirus 2 by RT PCR NEGATIVE NEGATIVE Final   Influenza A by PCR NEGATIVE NEGATIVE Final   Influenza B by PCR NEGATIVE NEGATIVE Final    Comment: (NOTE) The Xpert Xpress SARS-CoV-2/FLU/RSV plus assay is intended as an aid in the diagnosis of influenza from Nasopharyngeal swab specimens and should not be used as a sole basis for treatment. Nasal washings and aspirates are unacceptable for Xpert Xpress SARS-CoV-2/FLU/RSV testing.  Fact Sheet for Patients: BloggerCourse.com  Fact Sheet for Healthcare Providers: SeriousBroker.it  This test is not yet approved or cleared by the [place] FDA and has been authorized for detection and/or diagnosis of SARS-CoV-2 by FDA under an Emergency Use Authorization (EUA). This EUA will remain in effect (meaning this test can be used) for the duration of the COVID-19 declaration under Section 564(b)(1) of the Act, 21 U.S.C. section 360bbb-3(b)(1), unless the authorization is terminated or revoked.     Resp Syncytial Virus by PCR NEGATIVE NEGATIVE Final    Comment: (NOTE) Fact Sheet for Patients: BloggerCourse.com  Fact Sheet for  Healthcare Providers: SeriousBroker.it  This test is not yet approved or cleared by the [place] and has been authorized for detection and/or diagnosis of SARS-CoV-2 by FDA under an Emergency Use Authorization (EUA). This EUA will remain in effect (meaning this test can be used) for the duration of the COVID-19 declaration under Section 564(b)(1) of the Act, 21 U.S.C. section 360bbb-3(b)(1), unless the authorization is terminated or revoked.  Performed at [facility], [address] [place], [place] [address]     RADIOLOGY STUDIES/RESULTS: DG Chest Port 1 View  Result Date: 10/26/2022 CLINICAL DATA:  Possible sepsis.  Dehydration, tachycardia. EXAM: PORTABLE CHEST 1 VIEW COMPARISON:  07/31/2022. FINDINGS: The heart size and mediastinal contours are within normal limits. There is atherosclerotic calcification of the aorta. No consolidation, effusion, or pneumothorax. No acute osseous abnormality. IMPRESSION: No active disease. Electronically Signed   By: Thornell Sartorius M.D.   On: 10/26/2022 20:54   CT ANGIO HEAD NECK W WO CM  Result Date: 10/25/2022 CLINICAL DATA:  Vertigo, peripheral EXAM: CT ANGIOGRAPHY HEAD AND NECK WITH AND WITHOUT CONTRAST TECHNIQUE: Multidetector CT imaging of the head and neck was performed using the standard protocol during bolus administration of intravenous contrast. Multiplanar CT image reconstructions and MIPs were obtained to evaluate the vascular anatomy. Carotid stenosis measurements (when applicable) are obtained utilizing NASCET criteria, using the distal internal carotid diameter as the denominator. RADIATION DOSE REDUCTION: This exam was performed according to the departmental dose-optimization program which includes automated exposure control, adjustment of the mA and/or kV according to patient size and/or use of iterative reconstruction technique. CONTRAST:  75mL OMNIPAQUE IOHEXOL 350 MG/ML SOLN COMPARISON:  None  Available. FINDINGS: CT HEAD FINDINGS Brain: No evidence of acute infarction, hemorrhage, hydrocephalus, extra-axial collection or mass lesion/mass effect. Vascular: See below. Skull: No acute fracture. Sinuses/Orbits: Mild paranasal sinus mucosal thickening. No acute orbital findings. Other: No mastoid effusions. Review of the MIP images confirms the above findings CTA NECK FINDINGS Aortic arch: Great vessel origins are patent without significant stenosis. Right carotid system: No evidence of dissection, stenosis (50% or greater), or occlusion. Left carotid system: No evidence of dissection, stenosis (50% or greater), or occlusion. Vertebral arteries: Left dominant. No evidence of dissection, stenosis (50% or greater), or occlusion. Skeleton: No acute abnormality on limited assessment. Other neck: No acute abnormality on limited assessment. Upper chest: Paraseptal emphysema. Review of the MIP images confirms the above findings CTA HEAD FINDINGS Anterior circulation: Bilateral intracranial ICAs, MCAs, and ACAs are patent without proximal hemodynamically significant stenosis. Posterior circulation: Bilateral intradural vertebral arteries, basilar artery and bilateral posterior cerebral arteries are patent without proximal hemodynamically significant stenosis. Venous sinuses: As permitted by contrast timing, patent. Review of the MIP images confirms the above findings IMPRESSION: 1. No evidence of acute intracranial abnormality. 2. No emergent large vessel occlusion or proximal hemodynamically significant stenosis. Electronically Signed   By: Feliberto Harts M.D.   On: 10/25/2022 18:21     LOS: 0 days   Jeoffrey Massed, MD  [facility]    To contact the attending provider between 7A-7P or the covering provider during after hours 7P-7A, please log into the web site [email or web address] and access using universal [place] password for that web site. If you do not have the password, please call the hospital  operator.  10/27/2022, 9:19 AM

## 2022-10-27 NOTE — Assessment & Plan Note (Addendum)
Ongoing UDS positive.

## 2022-10-28 ENCOUNTER — Encounter (HOSPITAL_COMMUNITY): Payer: Self-pay | Admitting: Internal Medicine

## 2022-10-28 ENCOUNTER — Other Ambulatory Visit (HOSPITAL_COMMUNITY): Payer: MEDICAID

## 2022-10-28 ENCOUNTER — Inpatient Hospital Stay (HOSPITAL_COMMUNITY): Payer: MEDICAID

## 2022-10-28 DIAGNOSIS — B49 Unspecified mycosis: Secondary | ICD-10-CM | POA: Diagnosis present

## 2022-10-28 DIAGNOSIS — N179 Acute kidney failure, unspecified: Secondary | ICD-10-CM | POA: Diagnosis not present

## 2022-10-28 DIAGNOSIS — F1410 Cocaine abuse, uncomplicated: Secondary | ICD-10-CM | POA: Diagnosis not present

## 2022-10-28 DIAGNOSIS — F1110 Opioid abuse, uncomplicated: Secondary | ICD-10-CM | POA: Diagnosis not present

## 2022-10-28 DIAGNOSIS — F1910 Other psychoactive substance abuse, uncomplicated: Secondary | ICD-10-CM | POA: Diagnosis not present

## 2022-10-28 DIAGNOSIS — F1010 Alcohol abuse, uncomplicated: Secondary | ICD-10-CM | POA: Diagnosis not present

## 2022-10-28 LAB — HEPATIC FUNCTION PANEL
ALT: 14 U/L (ref 0–44)
AST: 17 U/L (ref 15–41)
Albumin: 2.9 g/dL — ABNORMAL LOW (ref 3.5–5.0)
Alkaline Phosphatase: 52 U/L (ref 38–126)
Bilirubin, Direct: <0.1 mg/dL (ref 0.0–0.2)
Total Bilirubin: 0.3 mg/dL (ref 0.3–1.2)
Total Protein: 5.7 g/dL — ABNORMAL LOW (ref 6.5–8.1)

## 2022-10-28 LAB — BASIC METABOLIC PANEL
Anion gap: 10 (ref 5–15)
BUN: 26 mg/dL — ABNORMAL HIGH (ref 6–20)
CO2: 21 mmol/L — ABNORMAL LOW (ref 22–32)
Calcium: 8.2 mg/dL — ABNORMAL LOW (ref 8.9–10.3)
Chloride: 107 mmol/L (ref 98–111)
Creatinine, Ser: 1.16 mg/dL (ref 0.61–1.24)
GFR, Estimated: >60 mL/min (ref >60)
Glucose, Bld: 108 mg/dL — ABNORMAL HIGH (ref 70–99)
Potassium: 4.6 mmol/L (ref 3.5–5.1)
Sodium: 138 mmol/L (ref 135–145)

## 2022-10-28 LAB — BLOOD CULTURE ID PANEL (REFLEXED) - BCID2

## 2022-10-28 LAB — CBC
HCT: 35.5 % — ABNORMAL LOW (ref 39.0–52.0)
Hemoglobin: 11.5 g/dL — ABNORMAL LOW (ref 13.0–17.0)
MCH: 27.8 pg (ref 26.0–34.0)
MCHC: 32.4 g/dL (ref 30.0–36.0)
MCV: 86 fL (ref 80.0–100.0)
Platelets: 184 10*3/uL (ref 150–400)
RBC: 4.13 MIL/uL — ABNORMAL LOW (ref 4.22–5.81)
RDW: 14.2 % (ref 11.5–15.5)
WBC: 7 10*3/uL (ref 4.0–10.5)
nRBC: 0 % (ref 0.0–0.2)

## 2022-10-28 LAB — METHYLMALONIC ACID, SERUM: Methylmalonic Acid, Quantitative: 212 nmol/L (ref 0–378)

## 2022-10-28 MED ORDER — SODIUM CHLORIDE 0.9 % IV SOLN: INTRAVENOUS | Status: AC

## 2022-10-28 MED ORDER — SODIUM CHLORIDE 0.9 % IV SOLN
150.0000 mg | INTRAVENOUS | Status: DC
  Administered 2022-10-28 – 2022-10-29 (×2): 150 mg via INTRAVENOUS
  Filled 2022-10-28 (×2): qty 7.5

## 2022-10-28 MED ORDER — NICOTINE 14 MG/24HR TD PT24
14.0000 mg | MEDICATED_PATCH | Freq: Every day | TRANSDERMAL | Status: DC
  Administered 2022-10-28 – 2022-10-30 (×3): 14 mg via TRANSDERMAL
  Filled 2022-10-28 (×3): qty 1

## 2022-10-28 MED ORDER — MECLIZINE HCL 25 MG PO TABS
25.0000 mg | ORAL_TABLET | Freq: Three times a day (TID) | ORAL | Status: DC | PRN
  Administered 2022-10-28: 25 mg via ORAL
  Filled 2022-10-28 (×2): qty 1

## 2022-10-28 MED ORDER — CYANOCOBALAMIN 1000 MCG/ML IJ SOLN
1000.0000 ug | Freq: Every day | INTRAMUSCULAR | Status: AC
  Administered 2022-10-28 – 2022-11-03 (×7): 1000 ug via SUBCUTANEOUS
  Filled 2022-10-28 (×7): qty 1

## 2022-10-28 NOTE — Progress Notes (Signed)
Notified by RN that patient smokes 1.5 packs of cigarettes daily and is requesting nicotine patch.  Order placed.

## 2022-10-28 NOTE — Progress Notes (Signed)
PT Cancellation/Discharge Note  Patient Details Name: Nicholas [name] MRN: [id number] DOB: [birth year]   Cancelled Treatment:    Reason Eval/Treat Not Completed: Other (comment)  Discussed his dizziness at length. Patient reports he no longer has spinning dizziness like he had before. He does not see the need for PT evaluation at this time. Will notify his RN or MD if the spinning returns so they can reorder PT. Will sign off.    Jerolyn Center, PT Acute Rehabilitation Services  Office [phone]   Zena Amos 10/28/2022, 3:04 PM

## 2022-10-28 NOTE — Progress Notes (Signed)
PHARMACY - PHYSICIAN COMMUNICATION CRITICAL VALUE ALERT - BLOOD CULTURE IDENTIFICATION (BCID)  Nicholas [name] is an 52 y.o. male who presented to [facility] on 10/26/2022 with 1/4 Bcx growing candida albicans with no resistance  Name of physician (or Provider) Contacted: Dr. Jerral Ralph  Current antibiotics: micafungin 150mg  IV every day  Changes to prescribed antibiotics recommended:   -No changes needed at this time  No results found for this or any previous visit.  Arabella Merles, PharmD. Clinical Pharmacist 10/28/2022 7:24 AM

## 2022-10-28 NOTE — Progress Notes (Addendum)
PROGRESS NOTE        PATIENT DETAILS Name: Nicholas [name] Age: 52 y.o. Sex: male Date of Birth: 01-Sep-1970 Admit Date: 10/26/2022 Admitting Physician Dewayne Shorter Levora Dredge, MD PCP:Pcp, No  Brief Summary: Patient is a 52 y.o.  male with history of polysubstance abuse (heroin/Suboxone/oral narcotics/EtOH use)-who signed out AMA on 9/27 after presenting with vertigo-presented back to the ED on 9/27 evening with vertigo and just not feeling well.  Acknowledges taking a "pain pill" after he left the hospital AMA.  Patient was subsequently admitted to the hospitalist service-post admission-1/2 set of blood cultures positive for fungemia.  Significant events: 9/26-9/27>> hospitalized for evaluation of vertigo-left AMA 9/27>> readmit to [facility]  Significant studies: 9/25>> x-ray right wrist: No acute fracture 9/25>> x-ray right knee: No fracture 9/26>> CT angio head/neck: No LVO or hemodynamically significant stenosis. 9/26>> UDS:+ Cocaine 9/27>> UDS:+ Cocaine/opiates (acknowledges taking a pain pill after he signed out AMA) 9/27>> CXR: No PNA  Significant microbiology data: 9/27>> blood culture: 1/2-yeast 9/27>> COVID/influenza/RSV PCR: Negative  Procedures: None  Consults: Infectious disease  Subjective: Feels better-less vertigo.  Acknowledges doing IVDA-when asked as overnight cultures are positive for fungemia.  Objective: Vitals: Blood pressure 108/76, pulse 66, temperature 98.1 F (36.7 C), temperature source Oral, resp. rate 17, height 6' (1.829 m), weight 68 kg, SpO2 97%.   Exam: Gen Exam:Alert awake-not in any distress HEENT:atraumatic, normocephalic Chest: B/L clear to auscultation anteriorly CVS:S1S2 regular Abdomen:soft non tender, non distended Extremities:no edema Neurology: Non focal Skin: no rash  Pertinent Labs/Radiology:    Latest Ref Rng & Units 10/27/2022    4:42 AM 10/26/2022    8:22 PM 10/25/2022   12:10 AM  CBC  WBC 4.0 - 10.5  K/uL 10.3  15.7  9.1   Hemoglobin 13.0 - 17.0 g/dL 57.8  46.9  62.9   Hematocrit 39.0 - 52.0 % 35.4  43.8  33.3   Platelets 150 - 400 K/uL 210  320  196     Lab Results  Component Value Date   NA 137 10/27/2022   K 3.9 10/27/2022   CL 107 10/27/2022   CO2 22 10/27/2022      Assessment/Plan: Hypotension Likely due to hypovolemia BP is now stabilized.  Fungemia In the setting of drug use-when asked this morning on 9/29-he acknowledges intermittent IV drug use ID following-now on micafungin Await transthoracic echo Ophthalmology-Dr. Vonna Kotyk to see later   Vertigo Concern for BPPV-as some of it appears positional As needed meclizine Vestibular PT Reviewed prior progress note from Dr. Raeanne Gathers to obtain MRI due to metallic object embedded in his chest.  AKI Suspect this is hemodynamically mediated Minimal proteinuria Gently hydrate Avoid nephrotoxic agents Awaiting morning labs-not yet drawn.  Polysubstance abuse (IV drug use) EtOH use Acknowledges doing cocaine-pain pills and doing heroin when he cannot get pain pills (apparently addicted to narcotics since recent trauma resulting in jaw fracture several months ago).  On 9/29-acknowledges occasional IV drug use. Clonidine withdrawal protocol  Ativan per [facility] protocol Supportive care  Borderline vitamin B12 deficiency Start supplementation  BMI: Estimated body mass index is 20.33 kg/m as calculated from the following:   Height as of this encounter: 6' (1.829 m).   Weight as of this encounter: 68 kg.   Code status:   Code Status: Full Code   DVT Prophylaxis: enoxaparin (LOVENOX) injection 40  mg Start: 10/27/22 1400   Family Communication: None at bedside   Disposition Plan: Status is: Observation The patient will require care spanning > 2 midnights and should be moved to inpatient because: Severity of illness   Planned Discharge Destination:Home   Diet: Diet Order             Diet Heart Room  service appropriate? Yes; Fluid consistency: Thin  Diet effective now                     Antimicrobial agents: Anti-infectives (From admission, onward)    Start     Dose/Rate Route Frequency Ordered Stop   10/28/22 0800  micafungin (MYCAMINE) 150 mg in sodium chloride 0.9 % 100 mL IVPB        150 mg 107.5 mL/hr over 1 Hours Intravenous Every 24 hours 10/28/22 0657     10/26/22 2045  vancomycin (VANCOREADY) IVPB 1500 mg/300 mL        1,500 mg 150 mL/hr over 120 Minutes Intravenous  Once 10/26/22 2030 10/27/22 0120   10/26/22 2030  ceFEPIme (MAXIPIME) 2 g in sodium chloride 0.9 % 100 mL IVPB        2 g 200 mL/hr over 30 Minutes Intravenous  Once 10/26/22 2028 10/26/22 2130   10/26/22 2030  metroNIDAZOLE (FLAGYL) IVPB 500 mg        500 mg 100 mL/hr over 60 Minutes Intravenous  Once 10/26/22 2028 10/27/22 0259   10/26/22 2030  vancomycin (VANCOCIN) IVPB 1000 mg/200 mL premix  Status:  Discontinued        1,000 mg 200 mL/hr over 60 Minutes Intravenous  Once 10/26/22 2028 10/26/22 2030        MEDICATIONS: Scheduled Meds:  cloNIDine  0.1 mg Oral QID   Followed by   Melene Muller ON 10/29/2022] cloNIDine  0.1 mg Oral BH-qamhs   Followed by   Melene Muller ON 10/31/2022] cloNIDine  0.1 mg Oral QAC breakfast   cyanocobalamin  1,000 mcg Subcutaneous Daily   enoxaparin (LOVENOX) injection  40 mg Subcutaneous Q24H   folic acid  1 mg Oral Daily   multivitamin with minerals  1 tablet Oral Daily   thiamine  100 mg Oral Daily   Or   thiamine  100 mg Intravenous Daily   Continuous Infusions:  micafungin (MYCAMINE) 150 mg in sodium chloride 0.9 % 100 mL IVPB 150 mg (10/28/22 0836)   PRN Meds:.acetaminophen **OR** acetaminophen, dicyclomine, hydrOXYzine, loperamide, LORazepam **OR** LORazepam, meclizine, methocarbamol, naproxen, ondansetron **OR** ondansetron (ZOFRAN) IV   I have personally reviewed following labs and imaging studies  LABORATORY DATA: CBC: Recent Labs  Lab 10/24/22 1819  10/25/22 0010 10/26/22 2022 10/27/22 0442  WBC 11.9* 9.1 15.7* 10.3  NEUTROABS  --   --  12.4*  --   HGB 12.2* 10.7* 14.3 11.1*  HCT 38.1* 33.3* 43.8 35.4*  MCV 87.0 85.4 87.4 86.8  PLT 238 196 320 210    Basic Metabolic Panel: Recent Labs  Lab 10/24/22 1819 10/24/22 2117 10/25/22 0010 10/26/22 2022 10/27/22 0442  NA 140  --  137 144 137  K 3.4*  --  3.0* 5.1 3.9  CL 104  --  105 105 107  CO2 25  --  22 21* 22  GLUCOSE 84  --  114* 117* 103*  BUN 11  --  13 19 28*  CREATININE 1.95*  --  1.75* 2.42* 2.22*  CALCIUM 8.8*  --  8.0* 9.8 8.2*  MG  --  2.0 1.7  --   --   PHOS  --   --  4.0  --   --     GFR: Estimated Creatinine Clearance: 36.6 mL/min (A) (by C-G formula based on SCr of 2.22 mg/dL (H)).  Liver Function Tests: Recent Labs  Lab 10/24/22 1819 10/26/22 2022  AST 16 17  ALT 15 16  ALKPHOS 68 70  BILITOT 0.5 0.4  PROT 6.8 7.7  ALBUMIN 3.6 4.0   No results for input(s): "LIPASE", "AMYLASE" in the last 168 hours. No results for input(s): "AMMONIA" in the last 168 hours.  Coagulation Profile: Recent Labs  Lab 10/26/22 2045  INR 1.2    Cardiac Enzymes: No results for input(s): "CKTOTAL", "CKMB", "CKMBINDEX", "TROPONINI" in the last 168 hours.  BNP (last 3 results) No results for input(s): "PROBNP" in the last 8760 hours.  Lipid Profile: No results for input(s): "CHOL", "HDL", "LDLCALC", "TRIG", "CHOLHDL", "LDLDIRECT" in the last 72 hours.  Thyroid Function Tests: No results for input(s): "TSH", "T4TOTAL", "FREET4", "T3FREE", "THYROIDAB" in the last 72 hours.  Anemia Panel: No results for input(s): "VITAMINB12", "FOLATE", "FERRITIN", "TIBC", "IRON", "RETICCTPCT" in the last 72 hours.   Urine analysis:    Component Value Date/Time   COLORURINE YELLOW 10/27/2022 0147   APPEARANCEUR CLEAR 10/27/2022 0147   LABSPEC 1.013 10/27/2022 0147   PHURINE 7.0 10/27/2022 0147   GLUCOSEU 50 (A) 10/27/2022 0147   HGBUR NEGATIVE 10/27/2022 0147    BILIRUBINUR NEGATIVE 10/27/2022 0147   KETONESUR 5 (A) 10/27/2022 0147   PROTEINUR 30 (A) 10/27/2022 0147   UROBILINOGEN 0.2 02/01/2012 1041   NITRITE NEGATIVE 10/27/2022 0147   LEUKOCYTESUR NEGATIVE 10/27/2022 0147    Sepsis Labs: Lactic Acid, Venous    Component Value Date/Time   LATICACIDVEN 1.8 10/26/2022 2034    MICROBIOLOGY: Recent Results (from the past 240 hour(s))  Blood Culture (routine x 2)     Status: None (Preliminary result)   Collection Time: 10/26/22  8:45 PM   Specimen: BLOOD  Result Value Ref Range Status   Specimen Description BLOOD SITE NOT SPECIFIED  Final   Special Requests   Final    BOTTLES DRAWN AEROBIC AND ANAEROBIC Blood Culture adequate volume   Culture  Setup Time   Final    YEAST AEROBIC BOTTLE ONLY CRITICAL RESULT CALLED TO, READ BACK BY AND VERIFIED WITH: J [facility]  10/28/22 MK    Culture   Final    YEAST Performed at [facility], [address], [place], [place] [address]    Report Status PENDING  Incomplete  Blood Culture ID Panel (Reflexed)     Status: Abnormal   Collection Time: 10/26/22  8:45 PM  Result Value Ref Range Status   Enterococcus faecalis NOT DETECTED NOT DETECTED Final   Enterococcus Faecium NOT DETECTED NOT DETECTED Final   Listeria monocytogenes NOT DETECTED NOT DETECTED Final   Staphylococcus species NOT DETECTED NOT DETECTED Final   Staphylococcus aureus (BCID) NOT DETECTED NOT DETECTED Final   Staphylococcus epidermidis NOT DETECTED NOT DETECTED Final   Staphylococcus lugdunensis NOT DETECTED NOT DETECTED Final   Streptococcus species NOT DETECTED NOT DETECTED Final   Streptococcus agalactiae NOT DETECTED NOT DETECTED Final   Streptococcus pneumoniae NOT DETECTED NOT DETECTED Final   Streptococcus pyogenes NOT DETECTED NOT DETECTED Final   A.calcoaceticus-baumannii NOT DETECTED NOT DETECTED Final   Bacteroides fragilis NOT DETECTED NOT DETECTED Final   Enterobacterales NOT DETECTED NOT DETECTED  Final   Enterobacter cloacae complex NOT DETECTED  NOT DETECTED Final   Escherichia coli NOT DETECTED NOT DETECTED Final   Klebsiella aerogenes NOT DETECTED NOT DETECTED Final   Klebsiella oxytoca NOT DETECTED NOT DETECTED Final   Klebsiella pneumoniae NOT DETECTED NOT DETECTED Final   Proteus species NOT DETECTED NOT DETECTED Final   Salmonella species NOT DETECTED NOT DETECTED Final   Serratia marcescens NOT DETECTED NOT DETECTED Final   Haemophilus influenzae NOT DETECTED NOT DETECTED Final   Neisseria meningitidis NOT DETECTED NOT DETECTED Final   Pseudomonas aeruginosa NOT DETECTED NOT DETECTED Final   Stenotrophomonas maltophilia NOT DETECTED NOT DETECTED Final   Candida albicans DETECTED (A) NOT DETECTED Final    Comment: CRITICAL RESULT CALLED TO, READ BACK BY AND VERIFIED WITH: J WYLAND,[email or web address]@[id number]  10/28/22 MK    Candida auris NOT DETECTED NOT DETECTED Final   Candida glabrata NOT DETECTED NOT DETECTED Final   Candida krusei NOT DETECTED NOT DETECTED Final   Candida parapsilosis NOT DETECTED NOT DETECTED Final   Candida tropicalis NOT DETECTED NOT DETECTED Final   Cryptococcus neoformans/gattii NOT DETECTED NOT DETECTED Final    Comment: Performed at [facility], [address] [place], [place] [address]  Blood Culture (routine x 2)     Status: None (Preliminary result)   Collection Time: 10/26/22  8:50 PM   Specimen: BLOOD  Result Value Ref Range Status   Specimen Description BLOOD SITE NOT SPECIFIED  Final   Special Requests   Final    BOTTLES DRAWN AEROBIC AND ANAEROBIC Blood Culture results may not be optimal due to an inadequate volume of blood received in culture bottles   Culture   Final    NO GROWTH 2 DAYS Performed at [facility], [address], [place], [place] [address]    Report Status PENDING  Incomplete  Resp panel by RT-PCR (RSV, Flu A&B, Covid) Anterior Nasal Swab     Status: None   Collection Time: 10/26/22  9:55 PM   Specimen: Anterior  Nasal Swab  Result Value Ref Range Status   SARS Coronavirus 2 by RT PCR NEGATIVE NEGATIVE Final   Influenza A by PCR NEGATIVE NEGATIVE Final   Influenza B by PCR NEGATIVE NEGATIVE Final    Comment: (NOTE) The Xpert Xpress SARS-CoV-2/FLU/RSV plus assay is intended as an aid in the diagnosis of influenza from Nasopharyngeal swab specimens and should not be used as a sole basis for treatment. Nasal washings and aspirates are unacceptable for Xpert Xpress SARS-CoV-2/FLU/RSV testing.  Fact Sheet for Patients: BloggerCourse.com  Fact Sheet for Healthcare Providers: SeriousBroker.it  This test is not yet approved or cleared by the [place] FDA and has been authorized for detection and/or diagnosis of SARS-CoV-2 by FDA under an Emergency Use Authorization (EUA). This EUA will remain in effect (meaning this test can be used) for the duration of the COVID-19 declaration under Section 564(b)(1) of the Act, 21 U.S.C. section 360bbb-3(b)(1), unless the authorization is terminated or revoked.     Resp Syncytial Virus by PCR NEGATIVE NEGATIVE Final    Comment: (NOTE) Fact Sheet for Patients: BloggerCourse.com  Fact Sheet for Healthcare Providers: SeriousBroker.it  This test is not yet approved or cleared by the [place] FDA and has been authorized for detection and/or diagnosis of SARS-CoV-2 by FDA under an Emergency Use Authorization (EUA). This EUA will remain in effect (meaning this test can be used) for the duration of the COVID-19 declaration under Section 564(b)(1) of the Act, 21 U.S.C. section 360bbb-3(b)(1), unless the authorization is  terminated or revoked.  Performed at [facility], [address], [place], [place] [address]     RADIOLOGY STUDIES/RESULTS: DG Chest Port 1 View  Result Date: 10/26/2022 CLINICAL DATA:  Possible sepsis.  Dehydration,  tachycardia. EXAM: PORTABLE CHEST 1 VIEW COMPARISON:  07/31/2022. FINDINGS: The heart size and mediastinal contours are within normal limits. There is atherosclerotic calcification of the aorta. No consolidation, effusion, or pneumothorax. No acute osseous abnormality. IMPRESSION: No active disease. Electronically Signed   By: Thornell Sartorius M.D.   On: 10/26/2022 20:54     LOS: 1 day   Jeoffrey Massed, MD  [facility]    To contact the attending provider between 7A-7P or the covering provider during after hours 7P-7A, please log into the web site [email or web address] and access using universal [place] password for that web site. If you do not have the password, please call the hospital operator.  10/28/2022, 11:49 AM
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
## 2022-10-28 NOTE — Consult Note (Signed)
  Ophthalmology Consult  52 yo male with h/o Candidemia secondary to IVDU having exam to rule out ocular involvement. Pt has no ocular complaints.  Pt uses OTC readers for near vision.  On exam pt was 20/30 OU with near card and +2.00 lens.  PERRL, EOMI, Full Ductions and Versions. Visual field full to confrontation in both eyes.  No APD.  IOP was 14 OU.  Exam was within normal limits.  No conjunctival injection and corneas were clear.  Anterior chamber was deep and quiet and iris was reactive and round in both eyes.  Pt has 1+ nuclear sclerosis in both eyes.  On dilated exam vitreous was clear and c/d was 0.4 in both eyes with no optic nerve edema and retina was flat and intact  A/P Candidemia: no ocular involvement.  Normal eye exam.  Pt to follow up with their local eye doctor for yearly exams.  Thank you for allowing me to participate in the care of this patient.  Please feel free to contact me if you have any concerns.  Mia Creek, M.D. Cell [phone] Office [phone]

## 2022-10-29 ENCOUNTER — Inpatient Hospital Stay (HOSPITAL_COMMUNITY): Payer: MEDICAID

## 2022-10-29 DIAGNOSIS — N179 Acute kidney failure, unspecified: Secondary | ICD-10-CM | POA: Diagnosis not present

## 2022-10-29 DIAGNOSIS — R7881 Bacteremia: Secondary | ICD-10-CM

## 2022-10-29 DIAGNOSIS — B49 Unspecified mycosis: Secondary | ICD-10-CM

## 2022-10-29 DIAGNOSIS — F1110 Opioid abuse, uncomplicated: Secondary | ICD-10-CM | POA: Diagnosis not present

## 2022-10-29 DIAGNOSIS — F1010 Alcohol abuse, uncomplicated: Secondary | ICD-10-CM | POA: Diagnosis not present

## 2022-10-29 DIAGNOSIS — F1410 Cocaine abuse, uncomplicated: Secondary | ICD-10-CM | POA: Diagnosis not present

## 2022-10-29 LAB — CBC
HCT: 38.6 % — ABNORMAL LOW (ref 39.0–52.0)
Hemoglobin: 12.2 g/dL — ABNORMAL LOW (ref 13.0–17.0)
MCH: 27.2 pg (ref 26.0–34.0)
MCHC: 31.6 g/dL (ref 30.0–36.0)
MCV: 86 fL (ref 80.0–100.0)
Platelets: 198 10*3/uL (ref 150–400)
RBC: 4.49 MIL/uL (ref 4.22–5.81)
RDW: 14.4 % (ref 11.5–15.5)
WBC: 6.1 10*3/uL (ref 4.0–10.5)
nRBC: 0 % (ref 0.0–0.2)

## 2022-10-29 LAB — COMPREHENSIVE METABOLIC PANEL
ALT: 15 U/L (ref 0–44)
AST: 17 U/L (ref 15–41)
Albumin: 3.1 g/dL — ABNORMAL LOW (ref 3.5–5.0)
Alkaline Phosphatase: 57 U/L (ref 38–126)
Anion gap: 7 (ref 5–15)
BUN: 21 mg/dL — ABNORMAL HIGH (ref 6–20)
CO2: 24 mmol/L (ref 22–32)
Calcium: 8.7 mg/dL — ABNORMAL LOW (ref 8.9–10.3)
Chloride: 109 mmol/L (ref 98–111)
Creatinine, Ser: 1.31 mg/dL — ABNORMAL HIGH (ref 0.61–1.24)
GFR, Estimated: >60 mL/min (ref >60)
Glucose, Bld: 95 mg/dL (ref 70–99)
Potassium: 4.4 mmol/L (ref 3.5–5.1)
Sodium: 140 mmol/L (ref 135–145)
Total Bilirubin: <0.1 mg/dL — ABNORMAL LOW (ref 0.3–1.2)
Total Protein: 6.1 g/dL — ABNORMAL LOW (ref 6.5–8.1)

## 2022-10-29 LAB — ECHOCARDIOGRAM COMPLETE
Area-P 1/2: 3.53 cm2
Calc EF: 52.9 %
Height: 72 in
S' Lateral: 3.3 cm
Single Plane A2C EF: 55.6 %
Single Plane A4C EF: 50 %
Weight: 2398.6 [oz_av]

## 2022-10-29 MED ORDER — FLUCONAZOLE 100 MG PO TABS
800.0000 mg | ORAL_TABLET | Freq: Once | ORAL | Status: AC
  Administered 2022-10-30: 800 mg via ORAL
  Filled 2022-10-29: qty 8

## 2022-10-29 MED ORDER — FLUCONAZOLE 100 MG PO TABS
400.0000 mg | ORAL_TABLET | Freq: Every day | ORAL | Status: DC
  Administered 2022-10-31 – 2022-11-01 (×2): 400 mg via ORAL
  Filled 2022-10-29 (×3): qty 4
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
## 2022-10-29 NOTE — TOC Initial Note (Signed)
Transition of Care [facility]) - Initial/Assessment Note    Patient Details  Name: Nicholas [name] MRN: [id number] Date of Birth: [birth year]  [facility] CM/SW Contact:    Mearl Latin, LCSW Phone Number: 10/29/2022, 4:50 PM  Clinical Narrative:                 Patient admitted from home. CSW following for cocaine use consult.   Expected Discharge Plan: Home/Self Care Barriers to Discharge: Continued Medical Work up   Patient Goals and CMS Choice            Expected Discharge Plan and Services In-house Referral: Clinical Social Work     Living arrangements for the past 2 months: Single Family Home                                      Prior Living Arrangements/Services Living arrangements for the past 2 months: Single Family Home                     Activities of Daily Living      Permission Sought/Granted                  Emotional Assessment       Orientation: : Oriented to Place, Oriented to Self, Oriented to  Time, Oriented to Situation Alcohol / Substance Use: Illicit Drugs Psych Involvement: No (comment)  Admission diagnosis:  Dehydration [E86.0] AKI (acute kidney injury) (HCC) [N17.9] Patient Active Problem List   Diagnosis Date Noted   Fungemia 10/28/2022   Opioid abuse (HCC) 10/27/2022   Alcohol abuse 10/27/2022   AKI (acute kidney injury) (HCC) 10/27/2022   Cocaine abuse (HCC) 10/26/2022   Syncope 10/25/2022   Ground-level fall 10/25/2022   Dysphagia 10/25/2022   Stage 1 acute kidney injury (HCC) 10/25/2022   Acute renal failure (ARF) (HCC) 10/25/2022   Vertigo 10/24/2022   Mandible fracture (HCC) 08/01/2022   MDD (major depressive disorder), recurrent episode (HCC) 01/18/2022   Suicidal ideation 01/16/2022   Acute opioid withdrawal (HCC) 08/23/2021   Alcohol withdrawal syndrome without complication (HCC) 08/23/2021   Polysubstance abuse (HCC)    Alcohol dependence with uncomplicated withdrawal (HCC)     Chest pain 03/22/2017   History of substance abuse (HCC) 03/22/2017   Tobacco abuse 03/22/2017   Nondisplaced fracture of lateral malleolus of left fibula, initial encounter for closed fracture 02/23/2016   Abdominal pain 11/08/2015   Dilated bile duct 11/08/2015   Nausea & vomiting 11/08/2015   PCP:  Pcp, No Pharmacy:   [id number] [place], [place] - [address] AT [facility] [address] [place] [address] Phone: [phone] Fax: [phone]  [id number] [place], [place] - [address] [facility] [address] [place] [address] Phone: [phone] Fax: [phone]  [name] [address] [place] [address] Phone: [phone] Fax: [phone]     Social Determinants of Health (SDOH) Social History: SDOH Screenings   Food Insecurity: No Food Insecurity (10/25/2022)  Housing: Low Risk  (10/25/2022)  Transportation Needs: No Transportation Needs (10/25/2022)  Recent Concern: Transportation Needs - Unmet Transportation Needs (08/01/2022)  Utilities: Not At Risk (10/25/2022)  Depression (PHQ2-9): High Risk (01/30/2022)  Tobacco Use: High Risk (10/28/2022)   SDOH Interventions:  Readmission Risk Interventions     No data to display
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
## 2022-10-29 NOTE — Progress Notes (Signed)
  Echocardiogram 2D Echocardiogram has been performed.  Nicholas [name] 10/29/2022, 10:51 AM

## 2022-10-29 NOTE — Plan of Care (Signed)
  Problem: Health Behavior/Discharge Planning: Goal: Ability to manage health-related needs will improve Outcome: Progressing   Problem: Nutrition: Goal: Adequate nutrition will be maintained Outcome: Progressing   Problem: Coping: Goal: Level of anxiety will decrease Outcome: Progressing   

## 2022-10-29 NOTE — Plan of Care (Signed)

## 2022-10-30 ENCOUNTER — Inpatient Hospital Stay (HOSPITAL_COMMUNITY): Payer: MEDICAID | Admitting: Anesthesiology

## 2022-10-30 ENCOUNTER — Encounter (HOSPITAL_COMMUNITY): Payer: Self-pay | Admitting: Internal Medicine

## 2022-10-30 ENCOUNTER — Inpatient Hospital Stay (HOSPITAL_COMMUNITY): Payer: MEDICAID

## 2022-10-30 ENCOUNTER — Encounter (HOSPITAL_COMMUNITY)
Admission: EM | Discharge status: Against Medical Advice | Payer: Self-pay | Source: Home / Self Care | Attending: Internal Medicine

## 2022-10-30 DIAGNOSIS — R7881 Bacteremia: Secondary | ICD-10-CM

## 2022-10-30 DIAGNOSIS — B377 Candidal sepsis: Secondary | ICD-10-CM | POA: Diagnosis not present

## 2022-10-30 DIAGNOSIS — N179 Acute kidney failure, unspecified: Secondary | ICD-10-CM | POA: Diagnosis not present

## 2022-10-30 DIAGNOSIS — F1410 Cocaine abuse, uncomplicated: Secondary | ICD-10-CM | POA: Diagnosis not present

## 2022-10-30 DIAGNOSIS — B49 Unspecified mycosis: Secondary | ICD-10-CM | POA: Diagnosis not present

## 2022-10-30 DIAGNOSIS — F1010 Alcohol abuse, uncomplicated: Secondary | ICD-10-CM | POA: Diagnosis not present

## 2022-10-30 HISTORY — PX: TEE WITHOUT CARDIOVERSION: SHX5443

## 2022-10-30 LAB — BASIC METABOLIC PANEL
Anion gap: 9 (ref 5–15)
BUN: 22 mg/dL — ABNORMAL HIGH (ref 6–20)
CO2: 21 mmol/L — ABNORMAL LOW (ref 22–32)
Calcium: 8.8 mg/dL — ABNORMAL LOW (ref 8.9–10.3)
Chloride: 109 mmol/L (ref 98–111)
Creatinine, Ser: 1.05 mg/dL (ref 0.61–1.24)
GFR, Estimated: >60 mL/min (ref >60)
Glucose, Bld: 96 mg/dL (ref 70–99)
Potassium: 4 mmol/L (ref 3.5–5.1)
Sodium: 139 mmol/L (ref 135–145)

## 2022-10-30 LAB — ECHO TEE

## 2022-10-30 SURGERY — ECHOCARDIOGRAM, TRANSESOPHAGEAL
Anesthesia: Monitor Anesthesia Care

## 2022-10-30 MED ORDER — SODIUM CHLORIDE 0.9 % IV SOLN
INTRAVENOUS | Status: DC | PRN
Start: 2022-10-30 — End: 2022-10-30

## 2022-10-30 MED ORDER — LIDOCAINE 2% (20 MG/ML) 5 ML SYRINGE
INTRAMUSCULAR | Status: DC | PRN
  Administered 2022-10-30 (×2): 30 mg via INTRAVENOUS

## 2022-10-30 MED ORDER — PROPOFOL 500 MG/50ML IV EMUL
INTRAVENOUS | Status: DC | PRN
  Administered 2022-10-30: 125 ug/kg/min via INTRAVENOUS

## 2022-10-30 MED ORDER — ORAL CARE MOUTH RINSE: 15.0000 mL | OROMUCOSAL | Status: DC | PRN

## 2022-10-30 MED ORDER — SODIUM CHLORIDE 0.9 % IV SOLN: INTRAVENOUS | Status: DC

## 2022-10-30 NOTE — Anesthesia Preprocedure Evaluation (Addendum)
Anesthesia Evaluation  Patient identified by MRN, date of birth, ID band Patient awake    Reviewed: Allergy & Precautions, NPO status , Patient's Chart, lab work & pertinent test results  Airway Mallampati: III  TM Distance: >3 FB Neck ROM: Full    Dental  (+) Loose, Missing   Pulmonary Current Smoker and Patient abstained from smoking.   Pulmonary exam normal        Cardiovascular negative cardio ROS Normal cardiovascular exam     Neuro/Psych  PSYCHIATRIC DISORDERS  Depression    negative neurological ROS     GI/Hepatic negative GI ROS,,,(+)     substance abuse    Endo/Other  negative endocrine ROS    Renal/GU Renal disease     Musculoskeletal negative musculoskeletal ROS (+)    Abdominal   Peds  Hematology  (+) Blood dyscrasia, anemia   Anesthesia Other Findings r/o endocarditis  Reproductive/Obstetrics                             Anesthesia Physical Anesthesia Plan  ASA: 3  Anesthesia Plan: MAC   Post-op Pain Management:    Induction:   PONV Risk Score and Plan: 0 and Treatment may vary due to age or medical condition and Propofol infusion  Airway Management Planned: Nasal Cannula  Additional Equipment:   Intra-op Plan:   Post-operative Plan:   Informed Consent: I have reviewed the patients History and Physical, chart, labs and discussed the procedure including the risks, benefits and alternatives for the proposed anesthesia with the patient or authorized representative who has indicated his/her understanding and acceptance.     Dental advisory given  Plan Discussed with: CRNA  Anesthesia Plan Comments:        Anesthesia Quick Evaluation

## 2022-10-30 NOTE — Plan of Care (Signed)
Pt has rested quietly throughout the night with no distress noted. Alert and oriented. On room air. SB/SR. Voids per urinal. Also up ad lib in room. Medicated for some knee pain and anxiety with relief noted. No other complaints voiced.     Problem: Education: Goal: Knowledge of General Education information will improve Description: Including pain rating scale, medication(s)/side effects and non-pharmacologic comfort measures Outcome: Progressing   Problem: Health Behavior/Discharge Planning: Goal: Ability to manage health-related needs will improve Outcome: Progressing   Problem: Clinical Measurements: Goal: Ability to maintain clinical measurements within normal limits will improve Outcome: Progressing Goal: Will remain free from infection Outcome: Progressing Goal: Diagnostic test results will improve Outcome: Progressing Goal: Respiratory complications will improve Outcome: Progressing Goal: Cardiovascular complication will be avoided Outcome: Progressing   Problem: Activity: Goal: Risk for activity intolerance will decrease Outcome: Progressing   Problem: Nutrition: Goal: Adequate nutrition will be maintained Outcome: Progressing   Problem: Coping: Goal: Level of anxiety will decrease Outcome: Progressing   Problem: Elimination: Goal: Will not experience complications related to bowel motility Outcome: Progressing Goal: Will not experience complications related to urinary retention Outcome: Progressing   Problem: Pain Managment: Goal: General experience of comfort will improve Outcome: Progressing   Problem: Safety: Goal: Ability to remain free from injury will improve Outcome: Progressing   Problem: Skin Integrity: Goal: Risk for impaired skin integrity will decrease Outcome: Progressing

## 2022-10-30 NOTE — Plan of Care (Signed)
Problem: Health Behavior/Discharge Planning: Goal: Ability to manage health-related needs will improve Outcome: Progressing   Problem: Clinical Measurements: Goal: Cardiovascular complication will be avoided Outcome: Progressing   Problem: Nutrition: Goal: Adequate nutrition will be maintained Outcome: Progressing   Problem: Coping: Goal: Level of anxiety will decrease Outcome: Progressing

## 2022-10-30 NOTE — Progress Notes (Signed)
[facility]    Date of Admission:  10/26/2022   Total days of antibiotics 3   ID: Nicholas [name] is a 52 y.o. male with  fungemia Principal Problem:   Fungemia Active Problems:   Polysubstance abuse (HCC)   Cocaine abuse (HCC)   Opioid abuse (HCC)   Alcohol abuse   AKI (acute kidney injury) (HCC)    Subjective: Afebrile. Underwent TEE but had pain at right PIV site with injection of medicine. Still grieving about the loss of overdose of his brother who died last week.  Medications:   cloNIDine  0.1 mg Oral BH-qamhs   Followed by   Melene Muller ON 10/31/2022] cloNIDine  0.1 mg Oral QAC breakfast   cyanocobalamin  1,000 mcg Subcutaneous Daily   enoxaparin (LOVENOX) injection  40 mg Subcutaneous Q24H   [START ON 10/31/2022] fluconazole  400 mg Oral Daily   folic acid  1 mg Oral Daily   multivitamin with minerals  1 tablet Oral Daily   nicotine  14 mg Transdermal Daily   thiamine  100 mg Oral Daily   Or   thiamine  100 mg Intravenous Daily    Objective: Vital signs in last 24 hours: Temp:  [97.6 F (36.4 C)-98.5 F (36.9 C)] 97.7 F (36.5 C) (10/01 1305) Pulse Rate:  [48-74] 61 (10/01 1305) Resp:  [12-20] 17 (10/01 1305) BP: (99-125)/(62-90) 106/80 (10/01 1305) SpO2:  [96 %-100 %] 96 % (10/01 1305)  Physical Exam  Constitutional: He is oriented to person, place, and time. He appears well-developed and well-nourished. No distress.  HENT:  Mouth/Throat: Oropharynx is clear and moist. No oropharyngeal exudate.  Cardiovascular: Normal rate, regular rhythm and normal heart sounds. Exam reveals no gallop and no friction rub.  No murmur heard.  Pulmonary/Chest: Effort normal and breath sounds normal. No respiratory distress. He has no wheezes.  Abdominal: Soft. Bowel sounds are normal. He exhibits no distension. There is no tenderness.  Lymphadenopathy:  He has no cervical adenopathy.  Neurological: He is alert and oriented to person, place, and  time.  Skin: Skin is warm and dry. PIV + Psychiatric: He has a normal mood and affect. His behavior is normal.    Lab Results Recent Labs    10/28/22 2210 10/29/22 0443 10/30/22 0404  WBC 7.0 6.1  --   HGB 11.5* 12.2*  --   HCT 35.5* 38.6*  --   NA  --  140 139  K  --  4.4 4.0  CL  --  109 109  CO2  --  24 21*  BUN  --  21* 22*  CREATININE  --  1.31* 1.05   Liver Panel Recent Labs    10/28/22 2210 10/29/22 0443  PROT 5.7* 6.1*  ALBUMIN 2.9* 3.1*  AST 17 17  ALT 14 15  ALKPHOS 52 57  BILITOT 0.3 <0.1*  BILIDIR <0.1  --   IBILI NOT CALCULATED  --     Microbiology: 9/29 blood cx ngtd Studies/Results: ECHO TEE  Result Date: 10/30/2022    TRANSESOPHOGEAL ECHO REPORT   Patient Name:   Nicholas [name] Date of Exam: 10/30/2022 Medical Rec #:  [id number]       Height:       72.0 in Accession #:    [id number]      Weight:       149.9 lb Date of Birth:  February 21, 1970        BSA:  1.885 m Patient Age:    52 years        BP:           122/82 mmHg Patient Gender: M               HR:           61 bpm. Exam Location:  Inpatient Procedure: Transesophageal Echo, Color Doppler and Cardiac Doppler Indications:     Bacteremia  History:         Patient has prior history of Echocardiogram examinations, most                  recent 10/29/2022. Risk Factors:Polysubstance abuse.  Sonographer:     Thurman Coyer RDCS Referring Phys:  [id number] Alben Spittle BRANCH Diagnosing Phys: [place] PROCEDURE: The transesophogeal probe was passed without difficulty through the esophogus of the patient. Sedation performed by different physician. The patient was monitored while under deep sedation. Anesthestetic sedation was provided intravenously by Anesthesiology: 68mg  of Propofol, 60mg  of Lidocaine. The patient developed no complications during the procedure.  IMPRESSIONS  1. Left ventricular ejection fraction, by estimation, is 60 to 65%. The left ventricle has normal function.  2. Right ventricular systolic  function is normal. The right ventricular size is normal.  3. No left atrial/left atrial appendage thrombus was detected.  4. The mitral valve is normal in structure. Trivial mitral valve regurgitation.  5. The aortic valve is tricuspid. Aortic valve regurgitation is not visualized. Conclusion(s)/Recommendation(s): No evidence of vegetation/infective endocarditis on this transesophageael echocardiogram. FINDINGS  Left Ventricle: Left ventricular ejection fraction, by estimation, is 60 to 65%. The left ventricle has normal function. The left ventricular internal cavity size was normal in size. Right Ventricle: The right ventricular size is normal. Right ventricular systolic function is normal. Left Atrium: No left atrial/left atrial appendage thrombus was detected. Pericardium: There is no evidence of pericardial effusion. Mitral Valve: The mitral valve is normal in structure. Trivial mitral valve regurgitation. Tricuspid Valve: The tricuspid valve is normal in structure. Tricuspid valve regurgitation is not demonstrated. Aortic Valve: The aortic valve is tricuspid. Aortic valve regurgitation is not visualized. Pulmonic Valve: The pulmonic valve was normal in structure. Pulmonic valve regurgitation is not visualized. Aorta: The aortic root is normal in size and structure. IAS/Shunts: No atrial level shunt detected by color flow Doppler. Carolan Clines Electronically signed by Carolan Clines Signature Date/Time: 10/30/2022/1:12:23 PM    Final    EP STUDY  Result Date: 10/30/2022 See surgical note for result.  ECHOCARDIOGRAM COMPLETE  Result Date: 10/29/2022    ECHOCARDIOGRAM REPORT   Patient Name:   Nicholas [name] Date of Exam: 10/29/2022 Medical Rec #:  [id number]       Height:       72.0 in Accession #:    [id number]      Weight:       149.9 lb Date of Birth:  05/20/1970        BSA:          1.885 m Patient Age:    52 years        BP:           125/82 mmHg Patient Gender: M               HR:           68 bpm. Exam  Location:  Inpatient Procedure: 2D Echo, 3D Echo, Cardiac Doppler and Color Doppler Indications:    Bacteremia  History:        Patient has prior history of Echocardiogram examinations, most                 recent 03/24/2017. Signs/Symptoms:Chest Pain and Syncope.                 Polysubstance abuse. ETOH. PFO.  Sonographer:    Sheralyn Boatman RDCS Referring Phys: [id number] Werner Lean [facility] IMPRESSIONS  1. Left ventricular ejection fraction, by estimation, is 55 to 60%. The left ventricle has normal function. The left ventricle has no regional wall motion abnormalities. Left ventricular diastolic parameters are consistent with Grade I diastolic dysfunction (impaired relaxation).  2. Right ventricular systolic function is normal. The right ventricular size is normal.  3. There is a promient interatrial septal aneursym with an associated PFO with left to right shunting seen.  4. The mitral valve is normal in structure. Trivial mitral valve regurgitation. No evidence of mitral stenosis.  5. The aortic valve is normal in structure. Aortic valve regurgitation is not visualized. Aortic valve sclerosis is present, with no evidence of aortic valve stenosis.  6. The inferior vena cava is normal in size with greater than 50% respiratory variability, suggesting right atrial pressure of 3 mmHg. Conclusion(s)/Recommendation(s): No evidence of valvular vegetations on this transthoracic echocardiogram. Consider a transesophageal echocardiogram to exclude infective endocarditis if clinically indicated. FINDINGS  Left Ventricle: Left ventricular ejection fraction, by estimation, is 55 to 60%. The left ventricle has normal function. The left ventricle has no regional wall motion abnormalities. The left ventricular internal cavity size was normal in size. There is  no left ventricular hypertrophy. Left ventricular diastolic parameters are consistent with Grade I diastolic dysfunction (impaired relaxation). Right Ventricle: The right ventricular  size is normal. No increase in right ventricular wall thickness. Right ventricular systolic function is normal. Left Atrium: Left atrial size was normal in size. Right Atrium: Right atrial size was normal in size. Pericardium: There is no evidence of pericardial effusion. Mitral Valve: The mitral valve is normal in structure. Trivial mitral valve regurgitation. No evidence of mitral valve stenosis. Tricuspid Valve: The tricuspid valve is normal in structure. Tricuspid valve regurgitation is trivial. No evidence of tricuspid stenosis. Aortic Valve: The aortic valve is normal in structure. Aortic valve regurgitation is not visualized. Aortic valve sclerosis is present, with no evidence of aortic valve stenosis. Pulmonic Valve: The pulmonic valve was normal in structure. Pulmonic valve regurgitation is not visualized. No evidence of pulmonic stenosis. Aorta: The aortic root is normal in size and structure. Venous: The inferior vena cava is normal in size with greater than 50% respiratory variability, suggesting right atrial pressure of 3 mmHg. IAS/Shunts: The interatrial septum is aneurysmal. The atrial septum is grossly normal.  LEFT VENTRICLE PLAX 2D LVIDd:         5.00 cm      Diastology LVIDs:         3.30 cm      LV e' medial:    7.51 cm/s LV PW:         1.00 cm      LV E/e' medial:  8.0 LV IVS:        0.80 cm      LV e' lateral:   12.60 cm/s LVOT diam:     2.70 cm      LV E/e' lateral: 4.8 LV SV:         128 LV SV Index:   68 LVOT Area:  5.73 cm                              3D Volume EF: LV Volumes (MOD)            3D EF:        54 % LV vol d, MOD A2C: 110.0 ml LV EDV:       142 ml LV vol d, MOD A4C: 110.0 ml LV ESV:       65 ml LV vol s, MOD A2C: 48.8 ml  LV SV:        77 ml LV vol s, MOD A4C: 55.0 ml LV SV MOD A2C:     61.2 ml LV SV MOD A4C:     110.0 ml LV SV MOD BP:      58.5 ml RIGHT VENTRICLE             IVC RV S prime:     12.90 cm/s  IVC diam: 2.10 cm TAPSE (M-mode): 1.8 cm LEFT ATRIUM              Index        RIGHT ATRIUM           Index LA diam:        3.70 cm 1.96 cm/m   RA Area:     14.80 cm LA Vol (A2C):   44.7 ml 23.71 ml/m  RA Volume:   39.10 ml  20.74 ml/m LA Vol (A4C):   42.3 ml 22.44 ml/m LA Biplane Vol: 46.1 ml 24.46 ml/m  AORTIC VALVE LVOT Vmax:   107.00 cm/s LVOT Vmean:  66.900 cm/s LVOT VTI:    0.223 m  AORTA Ao Root diam: 3.60 cm Ao Asc diam:  3.60 cm MITRAL VALVE MV Area (PHT): 3.53 cm    SHUNTS MV Decel Time: 215 msec    Systemic VTI:  0.22 m MV E velocity: 60.30 cm/s  Systemic Diam: 2.70 cm MV A velocity: 73.40 cm/s MV E/A ratio:  0.82 Arvilla Meres MD Electronically signed by Arvilla Meres MD Signature Date/Time: 10/29/2022/11:38:43 AM    Final      Assessment/Plan: Fungemia = work up thus far is negative (TEE is negative, ophtho ruled out endophthalmitis) repeat blood cx negative. Continue on high dose oral fluconazole 800mg  po daily for 14 days using 9/29 as day 1.   Grief = will have chaplain see him to help with loss of his brother.  Will sign off  [facility] Pager: [phone]  10/30/2022, 2:04 PM

## 2022-10-30 NOTE — Interval H&P Note (Signed)
History and Physical Interval Note:  10/30/2022 12:47 PM  Nicholas [name]  has presented today for surgery, with the diagnosis of r/o endocarditis.  The various methods of treatment have been discussed with the patient and family. After consideration of risks, benefits and other options for treatment, the patient has consented to  Procedure(s): TRANSESOPHAGEAL ECHOCARDIOGRAM (N/A) as a surgical intervention.  The patient's history has been reviewed, patient examined, no change in status, stable for surgery.  I have reviewed the patient's chart and labs.  Questions were answered to the patient's satisfaction.     Maisie Fus

## 2022-10-30 NOTE — Anesthesia Postprocedure Evaluation (Signed)
Anesthesia Post Note  Patient: Nicholas [name]  Procedure(s) Performed: TRANSESOPHAGEAL ECHOCARDIOGRAM     Patient location during evaluation: Cath Lab Anesthesia Type: MAC Level of consciousness: awake Pain management: pain level controlled Vital Signs Assessment: post-procedure vital signs reviewed and stable Respiratory status: spontaneous breathing, nonlabored ventilation and respiratory function stable Cardiovascular status: blood pressure returned to baseline and stable Postop Assessment: no apparent nausea or vomiting Anesthetic complications: no   No notable events documented.  Last Vitals:  Vitals:   10/30/22 1420 10/30/22 1545  BP: 104/75 103/60  Pulse: (!) 56 65  Resp: 16 15  Temp: 36.7 C 36.5 C  SpO2: 96% 96%    Last Pain:  Vitals:   10/30/22 1545  TempSrc: Oral  PainSc:                  [name]

## 2022-10-30 NOTE — Progress Notes (Signed)
Pt calling out for anxiety medication. Instructed pt that if he took anti-anxiety meds now that he will not be able to walk to meet sister. Pt stated that he didn't think she was coming. Just bring the meds.

## 2022-10-30 NOTE — Transfer of Care (Signed)
Immediate Anesthesia Transfer of Care Note  Patient: Nicholas [name]  Procedure(s) Performed: TRANSESOPHAGEAL ECHOCARDIOGRAM  Patient Location: PACU and Cath Lab  Anesthesia Type:MAC  Level of Consciousness: drowsy  Airway & Oxygen Therapy: Patient Spontanous Breathing and Patient connected to face mask oxygen  Post-op Assessment: Report given to RN and Post -op Vital signs reviewed and stable  Post vital signs: Reviewed and stable  Last Vitals:  Vitals Value Taken Time  BP 112/70 10/30/22 1249  Temp 36.6 C 10/30/22 1249  Pulse 52 10/30/22 1250  Resp 10 10/30/22 1250  SpO2 99 % 10/30/22 1250  Vitals shown include unfiled device data.  Last Pain:  Vitals:   10/30/22 1249  TempSrc: Temporal  PainSc: 0-No pain      Patients Stated Pain Goal: 0 (10/29/22 2156)  Complications: No notable events documented.

## 2022-10-30 NOTE — Progress Notes (Signed)
Pt back to room. No changes in condition. Pt states he got locked out of the hospital when he went out the front door.
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
## 2022-10-30 NOTE — Progress Notes (Signed)
   10/30/22 1400  Spiritual Encounters  Type of Visit Initial  Care provided to: Patient  Conversation partners present during encounter Other (comment)  Referral source Patient request  Reason for visit Grief/loss  OnCall Visit No  Spiritual Framework  Presenting Themes Impactful experiences and emotions  Patient Stress Factors Loss  Interventions  Spiritual Care Interventions Made Compassionate presence;Reflective listening;Normalization of emotions;Supported grief process  Intervention Outcomes  Outcomes Connection to spiritual care;Awareness of support   Ch responded to request for emotional and spiritual support. There was no family present at bedside. Pt is grieving the loss of his brother who overdosed last Friday. Pt had a prior experience where he found his girlfriend suffocated because of lack of oxygen. He is glad that he was not the one who found his brother.  Pt had a close relationship with his brother. Ch asked guided questions to bring forth feelings. Pt said he will page Ch when his mind clear out. Ch remains available when needed.

## 2022-10-30 NOTE — Progress Notes (Addendum)
While pulling his meds, came over the walkie talkie that the pt just got on the elevator by himself. The tech and the charge nurse went to find him but they could not. All pt's personal belongings were still in his room. Security alerted. AC notified.

## 2022-10-30 NOTE — Progress Notes (Signed)
Pt acquired about leaving floor to go meet sister who is bringing him some food and clothes. Instructed pt that he would have to walk with someone. Call and let us know when she arrives.

## 2022-10-30 NOTE — Progress Notes (Signed)
   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  After careful review of history and examination, the risks and benefits of transesophageal echocardiogram have been explained including risks of esophageal damage, perforation (1:10,000 risk), bleeding, pharyngeal hematoma as well as other potential complications associated with conscious sedation including aspiration, arrhythmia, respiratory failure and death. Alternatives to treatment were discussed, questions were answered. Patient is willing to proceed.   Abagail Kitchens, PA-C  10/30/2022 7:19 AM

## 2022-10-30 NOTE — CV Procedure (Signed)
INDICATIONS: Candida Fungemia  PROCEDURE:   Informed consent was obtained prior to the procedure. The risks, benefits and alternatives for the procedure were discussed and the patient comprehended these risks.  Risks include, but are not limited to, cough, sore throat, vomiting, nausea, somnolence, esophageal and stomach trauma or perforation, bleeding, low blood pressure, aspiration, pneumonia, infection, trauma to the teeth and death.    After a procedural time-out, the oropharynx was anesthetized with 20% benzocaine spray.   During this procedure the patient was administered propofol, see anesthesia note, to achieve and maintain moderate conscious sedation.  The patient's heart rate, blood pressure, and oxygen saturationweare monitored continuously during the procedure. The period of conscious sedation was 15 minutes, of which I was present face-to-face 100% of this time.  The transesophageal probe was inserted in the esophagus and stomach without difficulty and multiple views were obtained.  The patient was kept under observation until the patient left the procedure room.  The patient left the procedure room in stable condition.   Agitated microbubble saline contrast was not administered.  COMPLICATIONS:    There were no immediate complications.  FINDINGS:  Normal LV/RV function No valve abnormalities Negative for vegetations  RECOMMENDATIONS:     No changes  Time Spent Directly with the Patient:  15 minutes   Nicholas [name] 10/30/2022, 12:47 PM

## 2022-10-31 DIAGNOSIS — F1010 Alcohol abuse, uncomplicated: Secondary | ICD-10-CM | POA: Diagnosis not present

## 2022-10-31 DIAGNOSIS — B49 Unspecified mycosis: Secondary | ICD-10-CM | POA: Diagnosis not present

## 2022-10-31 DIAGNOSIS — F1410 Cocaine abuse, uncomplicated: Secondary | ICD-10-CM | POA: Diagnosis not present

## 2022-10-31 DIAGNOSIS — N179 Acute kidney failure, unspecified: Secondary | ICD-10-CM | POA: Diagnosis not present

## 2022-10-31 LAB — CULTURE, BLOOD (ROUTINE X 2): Culture: NO GROWTH

## 2022-10-31 MED ORDER — KETOROLAC TROMETHAMINE 30 MG/ML IJ SOLN
30.0000 mg | Freq: Three times a day (TID) | INTRAMUSCULAR | Status: DC | PRN
  Administered 2022-11-01 – 2022-11-02 (×2): 30 mg via INTRAVENOUS
  Filled 2022-10-31 (×3): qty 1

## 2022-10-31 MED ORDER — ACETAMINOPHEN 500 MG PO TABS
1000.0000 mg | ORAL_TABLET | Freq: Three times a day (TID) | ORAL | Status: DC
  Administered 2022-10-31 – 2022-11-03 (×8): 1000 mg via ORAL
  Filled 2022-10-31 (×9): qty 2

## 2022-10-31 MED ORDER — NICOTINE 21 MG/24HR TD PT24
21.0000 mg | MEDICATED_PATCH | Freq: Every day | TRANSDERMAL | Status: DC
  Administered 2022-10-31 – 2022-11-03 (×4): 21 mg via TRANSDERMAL
  Filled 2022-10-31 (×4): qty 1

## 2022-10-31 MED ORDER — LORAZEPAM 0.5 MG PO TABS
0.5000 mg | ORAL_TABLET | Freq: Once | ORAL | Status: AC | PRN
  Administered 2022-10-31: 0.5 mg via ORAL
  Filled 2022-10-31: qty 1

## 2022-10-31 NOTE — Progress Notes (Signed)
RTS [place] [phone]) [phone].

## 2022-10-31 NOTE — TOC Progression Note (Signed)
Transition of Care [facility]) - Progression Note    Patient Details  Name: Nicholas [name] MRN: [id number] Date of Birth: February 29, 1970  [facility] CM/SW Contact  Itzia Cunliffe Reeves For[place], Student-Social Work Phone Number: 10/31/2022, 1:57 PM  Clinical Narrative:    MSW Intern spoke with pt about substance use programs and offered pt a resource packet. Pt declined resource packet and stated he had found a facility he wanted to go to- he provided MSW Intern with fax number to [facility] and stated they needed his medical records to be sent over before he could begin treatment at the facility. MSW Intern took note of the phone number and reported back to supervisor.  Expected Discharge Plan: Home/Self Care Barriers to Discharge: Continued Medical Work up  Expected Discharge Plan and Services In-house Referral: Clinical Social Work     Living arrangements for the past 2 months: Single Family Home                                       Social Determinants of Health (SDOH) Interventions SDOH Screenings   Food Insecurity: No Food Insecurity (10/30/2022)  Housing: Low Risk  (10/30/2022)  Transportation Needs: No Transportation Needs (10/30/2022)  Recent Concern: Transportation Needs - Unmet Transportation Needs (08/01/2022)  Utilities: Not At Risk (10/30/2022)  Depression (PHQ2-9): High Risk (01/30/2022)  Tobacco Use: High Risk (10/28/2022)    Readmission Risk Interventions     No data to display

## 2022-10-31 NOTE — Progress Notes (Signed)
Patient intercepted by staff at 1900, he was attempting to leave the unit.   Primary RN at his side immediately to assist return to his room. He verbalizes understanding.   He also verbalized concerns related to his discharge. Handoff given to night RN.

## 2022-10-31 NOTE — TOC Progression Note (Signed)
Transition of Care [facility]) - Progression Note    Patient Details  Name: Nicholas [name] MRN: [id number] Date of Birth: 1970-03-23  [facility] Care [facility]) CM/SW Contact  Mearl Latin, LCSW Phone Number: 10/31/2022, 4:24 PM  Clinical Narrative:    CSW and MSW intern met with patient again at RN request as patient stated [facility] denied him for medical issues.  Patient reported that they declined him due to heart problems which he does not have. CSW offered to call Tawanna Cooler and speak with him to clarify for patient and patient agreed. Patient declined other options and resources.   CSW spoke with Tawanna Cooler at [facility] [phone]). 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 Their residential program is currently full with a 3-4 week waitlist so he offered to refer patient to [facility] or [facility] and patient declined.    Expected Discharge Plan: Home/Self Care Barriers to Discharge: Continued Medical Work up  Expected Discharge Plan and Services In-house Referral: Clinical Social Work     Living arrangements for the past 2 months: Single Family Home                                       Social Determinants of Health (SDOH) Interventions SDOH Screenings   Food Insecurity: No Food Insecurity (10/30/2022)  Housing: Low Risk  (10/30/2022)  Transportation Needs: No Transportation Needs (10/30/2022)  Recent Concern: Transportation Needs - Unmet Transportation Needs (08/01/2022)  Utilities: Not At Risk (10/30/2022)  Depression (PHQ2-9): High Risk (01/30/2022)  Tobacco Use: High Risk (10/28/2022)    Readmission Risk Interventions     No data to display

## 2022-10-31 NOTE — Plan of Care (Signed)

## 2022-10-31 NOTE — Plan of Care (Signed)
Pt has rested quietly throughout the night with no distress noted. Alert and oriented. On room air. SB/SR. Voids per urinal. Pt took shower. Medicated for pain with very little relief noted. Called MD for order for something more for pain but she denied any narcotics. Ativan po given once for anxiety. Pt takes himself off of the monitor and walks the halls or goes to the BR and then hooks himself back up. No complaints other than pain. Mostly right knee pain.     Problem: Education: Goal: Knowledge of General Education information will improve Description: Including pain rating scale, medication(s)/side effects and non-pharmacologic comfort measures Outcome: Progressing   Problem: Health Behavior/Discharge Planning: Goal: Ability to manage health-related needs will improve Outcome: Progressing   Problem: Clinical Measurements: Goal: Ability to maintain clinical measurements within normal limits will improve Outcome: Progressing Goal: Will remain free from infection Outcome: Progressing Goal: Diagnostic test results will improve Outcome: Progressing Goal: Respiratory complications will improve Outcome: Progressing Goal: Cardiovascular complication will be avoided Outcome: Progressing   Problem: Activity: Goal: Risk for activity intolerance will decrease Outcome: Progressing   Problem: Nutrition: Goal: Adequate nutrition will be maintained Outcome: Progressing   Problem: Coping: Goal: Level of anxiety will decrease Outcome: Progressing   Problem: Elimination: Goal: Will not experience complications related to bowel motility Outcome: Progressing Goal: Will not experience complications related to urinary retention Outcome: Progressing   Problem: Pain Managment: Goal: General experience of comfort will improve Outcome: Progressing   Problem: Safety: Goal: Ability to remain free from injury will improve Outcome: Progressing   Problem: Skin Integrity: Goal: Risk for  impaired skin integrity will decrease Outcome: Progressing

## 2022-10-31 NOTE — Progress Notes (Signed)
PROGRESS NOTE        PATIENT DETAILS Name: Nicholas [name] Age: 52 y.o. Sex: male Date of Birth: 02-15-1970 Admit Date: 10/26/2022 Admitting Physician Dewayne Shorter Levora Dredge, MD PCP:Pcp, No  Brief Summary: Patient is a 52 y.o.  male with history of polysubstance abuse (heroin/Suboxone/oral narcotics/EtOH use)-who signed out AMA on 9/27 after presenting with vertigo-presented back to the ED on 9/27 evening with vertigo and just not feeling well.  Acknowledges taking a "pain pill" after he left the hospital AMA.  Patient was subsequently admitted to the hospitalist service-post admission-1/2 set of blood cultures positive for fungemia.  Significant events: 9/26-9/27>> hospitalized for evaluation of vertigo-left AMA 9/27>> readmit to [facility]  Significant studies: 9/25>> x-ray right wrist: No acute fracture 9/25>> x-ray right knee: No fracture 9/26>> CT angio head/neck: No LVO or hemodynamically significant stenosis. 9/26>> UDS:+ Cocaine 9/27>> UDS:+ Cocaine/opiates (acknowledges taking a pain pill after he signed out AMA) 9/27>> CXR: No PNA 9/29>> MRI right knee: No evidence of septic arthritis/osteomyelitis. 9/30>> echo: EF 55-60%, grade 1 diastolic dysfunction, intra-atrial septal aneurysm with associated PFO  Significant microbiology data: 9/27>> blood culture: Candida albicans 9/27>> COVID/influenza/RSV PCR: Negative 9/30>> blood culture: No growth  Procedures: None  Consults: Infectious disease Ophthalmology  Subjective: Worsening right knee pain today-somewhat anxious.  Objective: Vitals: Blood pressure (!) 142/86, pulse 69, temperature 98.8 F (37.1 C), temperature source Oral, resp. rate 17, height 6' (1.829 m), weight 68 kg, SpO2 99%.   Exam: Gen Exam:Alert awake-not in any distress HEENT:atraumatic, normocephalic Chest: B/L clear to auscultation anteriorly CVS:S1S2 regular Abdomen:soft non tender, non distended Extremities:no edema-no  significant inflammation around right knee but much more tender than yesterday. Neurology: Non focal Skin: no rash  Pertinent Labs/Radiology:    Latest Ref Rng & Units 10/29/2022    4:43 AM 10/28/2022   10:10 PM 10/27/2022    4:42 AM  CBC  WBC 4.0 - 10.5 K/uL 6.1  7.0  10.3   Hemoglobin 13.0 - 17.0 g/dL 40.9  81.1  91.4   Hematocrit 39.0 - 52.0 % 38.6  35.5  35.4   Platelets 150 - 400 K/uL 198  184  210     Lab Results  Component Value Date   NA 139 10/30/2022   K 4.0 10/30/2022   CL 109 10/30/2022   CO2 21 (L) 10/30/2022      Assessment/Plan: Hypotension Likely due to hypovolemia BP now stable with supportive care.  Fungemia In the setting of drug use-when asked on 9/29-he acknowledges intermittent IV drug use Ophthalmology eval on 9/29-no evidence of eye involvement Repeat cultures negative Has been transitioned from micafungin to fluconazole TEE negative for endocarditis Worsening right knee pain today-concerned that this is developing septic arthritis-could be masked by ongoing antifungal therapy.  Appreciate orthopedic input-will consult IR to see if we can do a fluoroscopy guided arthrocentesis to minimize chances of a dry tap. ID following.  Vertigo Concern for BPPV-as some of it appears positional Resolved with supportive care and as needed meclizine. Reviewed prior progress note from Dr. Raeanne Gathers to obtain MRI due to metallic object embedded in his chest.  AKI Suspect this is hemodynamically mediated Minimal proteinuria Creatinine has normalized Follow periodically.  Polysubstance abuse (IV drug use) EtOH use Acknowledges doing cocaine-pain pills and doing heroin when he cannot get pain pills (apparently addicted to narcotics since recent  trauma resulting in jaw fracture several months ago).  On 9/29-acknowledges occasional IV drug use. Significant improvement withdrawal symptoms-very minimal Continue clonidine/Ativan taper protocol.   Avoid  narcotics.  Borderline vitamin B12 deficiency Continue supplementation SQ x 7 days-and then transition to oral B12 on discharge-patient to follow-up with PCP in 6 weeks for repeat B12 levels.  Interatrial septal aneurysm with associated PFO Incidental finding on echo Outpatient follow-up with cardiology   BMI: Estimated body mass index is 20.33 kg/m as calculated from the following:   Height as of this encounter: 6' (1.829 m).   Weight as of this encounter: 68 kg.   Code status:   Code Status: Full Code   DVT Prophylaxis: enoxaparin (LOVENOX) injection 40 mg Start: 10/27/22 1400   Family Communication: None at bedside   Disposition Plan: Status is: Observation The patient will require care spanning > 2 midnights and should be moved to inpatient because: Severity of illness   Planned Discharge Destination:Home   Diet: Diet Order             Diet regular Room service appropriate? Yes; Fluid consistency: Thin  Diet effective now                     Antimicrobial agents: Anti-infectives (From admission, onward)    Start     Dose/Rate Route Frequency Ordered Stop   10/31/22 1000  fluconazole (DIFLUCAN) tablet 400 mg       Placed in "Followed by" Linked Group   400 mg Oral Daily 10/29/22 1122 11/11/22 2359   10/30/22 0930  fluconazole (DIFLUCAN) tablet 800 mg       Placed in "Followed by" Linked Group   800 mg Oral  Once 10/29/22 1122 10/30/22 0919   10/28/22 0800  micafungin (MYCAMINE) 150 mg in sodium chloride 0.9 % 100 mL IVPB  Status:  Discontinued        150 mg 107.5 mL/hr over 1 Hours Intravenous Every 24 hours 10/28/22 0657 10/29/22 1122   10/26/22 2045  vancomycin (VANCOREADY) IVPB 1500 mg/300 mL        1,500 mg 150 mL/hr over 120 Minutes Intravenous  Once 10/26/22 2030 10/27/22 0120   10/26/22 2030  ceFEPIme (MAXIPIME) 2 g in sodium chloride 0.9 % 100 mL IVPB        2 g 200 mL/hr over 30 Minutes Intravenous  Once 10/26/22 2028 10/26/22 2130    10/26/22 2030  metroNIDAZOLE (FLAGYL) IVPB 500 mg        500 mg 100 mL/hr over 60 Minutes Intravenous  Once 10/26/22 2028 10/27/22 0259   10/26/22 2030  vancomycin (VANCOCIN) IVPB 1000 mg/200 mL premix  Status:  Discontinued        1,000 mg 200 mL/hr over 60 Minutes Intravenous  Once 10/26/22 2028 10/26/22 2030        MEDICATIONS: Scheduled Meds:  acetaminophen  1,000 mg Oral Q8H   cloNIDine  0.1 mg Oral QAC breakfast   cyanocobalamin  1,000 mcg Subcutaneous Daily   enoxaparin (LOVENOX) injection  40 mg Subcutaneous Q24H   fluconazole  400 mg Oral Daily   folic acid  1 mg Oral Daily   multivitamin with minerals  1 tablet Oral Daily   nicotine  21 mg Transdermal Daily   thiamine  100 mg Oral Daily   Continuous Infusions:   PRN Meds:.dicyclomine, hydrOXYzine, ketorolac, loperamide, meclizine, methocarbamol, ondansetron **OR** ondansetron (ZOFRAN) IV, mouth rinse   I have personally reviewed following labs and imaging  studies  LABORATORY DATA: CBC: Recent Labs  Lab 10/25/22 0010 10/26/22 2022 10/27/22 0442 10/28/22 2210 10/29/22 0443  WBC 9.1 15.7* 10.3 7.0 6.1  NEUTROABS  --  12.4*  --   --   --   HGB 10.7* 14.3 11.1* 11.5* 12.2*  HCT 33.3* 43.8 35.4* 35.5* 38.6*  MCV 85.4 87.4 86.8 86.0 86.0  PLT 196 320 210 184 198    Basic Metabolic Panel: Recent Labs  Lab 10/24/22 2117 10/25/22 0010 10/26/22 2022 10/27/22 0442 10/28/22 1523 10/29/22 0443 10/30/22 0404  NA  --  137 144 137 138 140 139  K  --  3.0* 5.1 3.9 4.6 4.4 4.0  CL  --  105 105 107 107 109 109  CO2  --  22 21* 22 21* 24 21*  GLUCOSE  --  114* 117* 103* 108* 95 96  BUN  --  13 19 28* 26* 21* 22*  CREATININE  --  1.75* 2.42* 2.22* 1.16 1.31* 1.05  CALCIUM  --  8.0* 9.8 8.2* 8.2* 8.7* 8.8*  MG 2.0 1.7  --   --   --   --   --   PHOS  --  4.0  --   --   --   --   --     GFR: Estimated Creatinine Clearance: 77.4 mL/min (by C-G formula based on SCr of 1.05 mg/dL).  Liver Function Tests: Recent  Labs  Lab 10/24/22 1819 10/26/22 2022 10/28/22 2210 10/29/22 0443  AST 16 17 17 17   ALT 15 16 14 15   ALKPHOS 68 70 52 57  BILITOT 0.5 0.4 0.3 <0.1*  PROT 6.8 7.7 5.7* 6.1*  ALBUMIN 3.6 4.0 2.9* 3.1*   No results for input(s): "LIPASE", "AMYLASE" in the last 168 hours. No results for input(s): "AMMONIA" in the last 168 hours.  Coagulation Profile: Recent Labs  Lab 10/26/22 2045  INR 1.2    Cardiac Enzymes: No results for input(s): "CKTOTAL", "CKMB", "CKMBINDEX", "TROPONINI" in the last 168 hours.  BNP (last 3 results) No results for input(s): "PROBNP" in the last 8760 hours.  Lipid Profile: No results for input(s): "CHOL", "HDL", "LDLCALC", "TRIG", "CHOLHDL", "LDLDIRECT" in the last 72 hours.  Thyroid Function Tests: No results for input(s): "TSH", "T4TOTAL", "FREET4", "T3FREE", "THYROIDAB" in the last 72 hours.  Anemia Panel: No results for input(s): "VITAMINB12", "FOLATE", "FERRITIN", "TIBC", "IRON", "RETICCTPCT" in the last 72 hours.   Urine analysis:    Component Value Date/Time   COLORURINE YELLOW 10/27/2022 0147   APPEARANCEUR CLEAR 10/27/2022 0147   LABSPEC 1.013 10/27/2022 0147   PHURINE 7.0 10/27/2022 0147   GLUCOSEU 50 (A) 10/27/2022 0147   HGBUR NEGATIVE 10/27/2022 0147   BILIRUBINUR NEGATIVE 10/27/2022 0147   KETONESUR 5 (A) 10/27/2022 0147   PROTEINUR 30 (A) 10/27/2022 0147   UROBILINOGEN 0.2 02/01/2012 1041   NITRITE NEGATIVE 10/27/2022 0147   LEUKOCYTESUR NEGATIVE 10/27/2022 0147    Sepsis Labs: Lactic Acid, Venous    Component Value Date/Time   LATICACIDVEN 1.8 10/26/2022 2034    MICROBIOLOGY: Recent Results (from the past 240 hour(s))  Blood Culture (routine x 2)     Status: Abnormal (Preliminary result)   Collection Time: 10/26/22  8:45 PM   Specimen: BLOOD  Result Value Ref Range Status   Specimen Description BLOOD SITE NOT SPECIFIED  Final   Special Requests   Final    BOTTLES DRAWN AEROBIC AND ANAEROBIC Blood Culture adequate  volume   Culture  Setup Time  Final    YEAST AEROBIC BOTTLE ONLY CRITICAL RESULT CALLED TO, READ BACK BY AND VERIFIED WITH: J [facility]  10/28/22 MK    Culture (A)  Final    CANDIDA ALBICANS Sent to [facility] for further susceptibility testing. Performed at [facility], [address] [place], [place] [address]    Report Status PENDING  Incomplete  Blood Culture ID Panel (Reflexed)     Status: Abnormal   Collection Time: 10/26/22  8:45 PM  Result Value Ref Range Status   Enterococcus faecalis NOT DETECTED NOT DETECTED Final   Enterococcus Faecium NOT DETECTED NOT DETECTED Final   Listeria monocytogenes NOT DETECTED NOT DETECTED Final   Staphylococcus species NOT DETECTED NOT DETECTED Final   Staphylococcus aureus (BCID) NOT DETECTED NOT DETECTED Final   Staphylococcus epidermidis NOT DETECTED NOT DETECTED Final   Staphylococcus lugdunensis NOT DETECTED NOT DETECTED Final   Streptococcus species NOT DETECTED NOT DETECTED Final   Streptococcus agalactiae NOT DETECTED NOT DETECTED Final   Streptococcus pneumoniae NOT DETECTED NOT DETECTED Final   Streptococcus pyogenes NOT DETECTED NOT DETECTED Final   A.calcoaceticus-baumannii NOT DETECTED NOT DETECTED Final   Bacteroides fragilis NOT DETECTED NOT DETECTED Final   Enterobacterales NOT DETECTED NOT DETECTED Final   Enterobacter cloacae complex NOT DETECTED NOT DETECTED Final   Escherichia coli NOT DETECTED NOT DETECTED Final   Klebsiella aerogenes NOT DETECTED NOT DETECTED Final   Klebsiella oxytoca NOT DETECTED NOT DETECTED Final   Klebsiella pneumoniae NOT DETECTED NOT DETECTED Final   Proteus species NOT DETECTED NOT DETECTED Final   Salmonella species NOT DETECTED NOT DETECTED Final   Serratia marcescens NOT DETECTED NOT DETECTED Final   Haemophilus influenzae NOT DETECTED NOT DETECTED Final   Neisseria meningitidis NOT DETECTED NOT DETECTED Final   Pseudomonas aeruginosa NOT DETECTED NOT DETECTED Final    Stenotrophomonas maltophilia NOT DETECTED NOT DETECTED Final   Candida albicans DETECTED (A) NOT DETECTED Final    Comment: CRITICAL RESULT CALLED TO, READ BACK BY AND VERIFIED WITH: J WYLAND,[email or web address]@[id number]  10/28/22 MK    Candida auris NOT DETECTED NOT DETECTED Final   Candida glabrata NOT DETECTED NOT DETECTED Final   Candida krusei NOT DETECTED NOT DETECTED Final   Candida parapsilosis NOT DETECTED NOT DETECTED Final   Candida tropicalis NOT DETECTED NOT DETECTED Final   Cryptococcus neoformans/gattii NOT DETECTED NOT DETECTED Final    Comment: Performed at [facility], [address], [place], [place] [address]  Blood Culture (routine x 2)     Status: None   Collection Time: 10/26/22  8:50 PM   Specimen: BLOOD  Result Value Ref Range Status   Specimen Description BLOOD SITE NOT SPECIFIED  Final   Special Requests   Final    BOTTLES DRAWN AEROBIC AND ANAEROBIC Blood Culture results may not be optimal due to an inadequate volume of blood received in culture bottles   Culture   Final    NO GROWTH 5 DAYS Performed at [facility], [address] [place], [place] [address]    Report Status 10/31/2022 FINAL  Final  Resp panel by RT-PCR (RSV, Flu A&B, Covid) Anterior Nasal Swab     Status: None   Collection Time: 10/26/22  9:55 PM   Specimen: Anterior Nasal Swab  Result Value Ref Range Status   SARS Coronavirus 2 by RT PCR NEGATIVE NEGATIVE Final   Influenza A by PCR NEGATIVE NEGATIVE Final   Influenza B by PCR NEGATIVE NEGATIVE Final  Comment: (NOTE) The Xpert Xpress SARS-CoV-2/FLU/RSV plus assay is intended as an aid in the diagnosis of influenza from Nasopharyngeal swab specimens and should not be used as a sole basis for treatment. Nasal washings and aspirates are unacceptable for Xpert Xpress SARS-CoV-2/FLU/RSV testing.  Fact Sheet for Patients: BloggerCourse.com  Fact Sheet for Healthcare  Providers: SeriousBroker.it  This test is not yet approved or cleared by the [place] FDA and has been authorized for detection and/or diagnosis of SARS-CoV-2 by FDA under an Emergency Use Authorization (EUA). This EUA will remain in effect (meaning this test can be used) for the duration of the COVID-19 declaration under Section 564(b)(1) of the Act, 21 U.S.C. section 360bbb-3(b)(1), unless the authorization is terminated or revoked.     Resp Syncytial Virus by PCR NEGATIVE NEGATIVE Final    Comment: (NOTE) Fact Sheet for Patients: BloggerCourse.com  Fact Sheet for Healthcare Providers: SeriousBroker.it  This test is not yet approved or cleared by the [place] FDA and has been authorized for detection and/or diagnosis of SARS-CoV-2 by FDA under an Emergency Use Authorization (EUA). This EUA will remain in effect (meaning this test can be used) for the duration of the COVID-19 declaration under Section 564(b)(1) of the Act, 21 U.S.C. section 360bbb-3(b)(1), unless the authorization is terminated or revoked.  Performed at [facility], [address], [place], [place] [address]   Culture, blood (Routine X 2) w Reflex to ID Panel     Status: None (Preliminary result)   Collection Time: 10/29/22  4:42 AM   Specimen: BLOOD LEFT ARM  Result Value Ref Range Status   Specimen Description BLOOD LEFT ARM  Final   Special Requests   Final    BOTTLES DRAWN AEROBIC AND ANAEROBIC Blood Culture results may not be optimal due to an excessive volume of blood received in culture bottles   Culture   Final    NO GROWTH 2 DAYS Performed at [facility], [address] [place], [place] [address]    Report Status PENDING  Incomplete  Culture, blood (Routine X 2) w Reflex to ID Panel     Status: None (Preliminary result)   Collection Time: 10/29/22  4:45 AM   Specimen: BLOOD LEFT ARM  Result Value  Ref Range Status   Specimen Description BLOOD LEFT ARM  Final   Special Requests   Final    BOTTLES DRAWN AEROBIC ONLY Blood Culture results may not be optimal due to an excessive volume of blood received in culture bottles   Culture   Final    NO GROWTH 2 DAYS Performed at [facility], [address], [place], [place] [address]    Report Status PENDING  Incomplete    RADIOLOGY STUDIES/RESULTS: ECHO TEE  Result Date: 10/30/2022    TRANSESOPHOGEAL ECHO REPORT   Patient Name:   Nicholas [name] Date of Exam: 10/30/2022 Medical Rec #:  [id number]       Height:       72.0 in Accession #:    [id number]      Weight:       149.9 lb Date of Birth:  1970/04/13        BSA:          1.885 m Patient Age:    52 years        BP:           122/82 mmHg Patient Gender: M  HR:           61 bpm. Exam Location:  Inpatient Procedure: Transesophageal Echo, Color Doppler and Cardiac Doppler Indications:     Bacteremia  History:         Patient has prior history of Echocardiogram examinations, most                  recent 10/29/2022. Risk Factors:Polysubstance abuse.  Sonographer:     Thurman Coyer RDCS Referring Phys:  [id number] Alben Spittle BRANCH Diagnosing Phys: [place] PROCEDURE: The transesophogeal probe was passed without difficulty through the esophogus of the patient. Sedation performed by different physician. The patient was monitored while under deep sedation. Anesthestetic sedation was provided intravenously by Anesthesiology: 68mg  of Propofol, 60mg  of Lidocaine. The patient developed no complications during the procedure.  IMPRESSIONS  1. Left ventricular ejection fraction, by estimation, is 60 to 65%. The left ventricle has normal function.  2. Right ventricular systolic function is normal. The right ventricular size is normal.  3. No left atrial/left atrial appendage thrombus was detected.  4. The mitral valve is normal in structure. Trivial mitral valve regurgitation.  5. The aortic valve is  tricuspid. Aortic valve regurgitation is not visualized. Conclusion(s)/Recommendation(s): No evidence of vegetation/infective endocarditis on this transesophageael echocardiogram. FINDINGS  Left Ventricle: Left ventricular ejection fraction, by estimation, is 60 to 65%. The left ventricle has normal function. The left ventricular internal cavity size was normal in size. Right Ventricle: The right ventricular size is normal. Right ventricular systolic function is normal. Left Atrium: No left atrial/left atrial appendage thrombus was detected. Pericardium: There is no evidence of pericardial effusion. Mitral Valve: The mitral valve is normal in structure. Trivial mitral valve regurgitation. Tricuspid Valve: The tricuspid valve is normal in structure. Tricuspid valve regurgitation is not demonstrated. Aortic Valve: The aortic valve is tricuspid. Aortic valve regurgitation is not visualized. Pulmonic Valve: The pulmonic valve was normal in structure. Pulmonic valve regurgitation is not visualized. Aorta: The aortic root is normal in size and structure. IAS/Shunts: No atrial level shunt detected by color flow Doppler. Carolan Clines Electronically signed by Carolan Clines Signature Date/Time: 10/30/2022/1:12:23 PM    Final    EP STUDY  Result Date: 10/30/2022 See surgical note for result.    LOS: 4 days   Jeoffrey Massed, MD  [facility]    To contact the attending provider between 7A-7P or the covering provider during after hours 7P-7A, please log into the web site [email or web address] and access using universal [place] password for that web site. If you do not have the password, please call the hospital operator.  10/31/2022, 12:32 PM
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
## 2022-10-31 NOTE — Plan of Care (Signed)

## 2022-11-01 ENCOUNTER — Inpatient Hospital Stay (HOSPITAL_COMMUNITY): Payer: MEDICAID

## 2022-11-01 DIAGNOSIS — F330 Major depressive disorder, recurrent, mild: Secondary | ICD-10-CM | POA: Diagnosis not present

## 2022-11-01 DIAGNOSIS — F1410 Cocaine abuse, uncomplicated: Secondary | ICD-10-CM | POA: Diagnosis not present

## 2022-11-01 DIAGNOSIS — F1010 Alcohol abuse, uncomplicated: Secondary | ICD-10-CM | POA: Diagnosis not present

## 2022-11-01 DIAGNOSIS — F1910 Other psychoactive substance abuse, uncomplicated: Secondary | ICD-10-CM | POA: Diagnosis not present

## 2022-11-01 DIAGNOSIS — N179 Acute kidney failure, unspecified: Secondary | ICD-10-CM | POA: Diagnosis not present

## 2022-11-01 DIAGNOSIS — B49 Unspecified mycosis: Secondary | ICD-10-CM | POA: Diagnosis not present

## 2022-11-01 LAB — HCV RNA QUANT: HCV Quantitative: NOT DETECTED [IU]/mL (ref >50)

## 2022-11-01 LAB — BASIC METABOLIC PANEL
Anion gap: 9 (ref 5–15)
BUN: 18 mg/dL (ref 6–20)
CO2: 22 mmol/L (ref 22–32)
Calcium: 8.9 mg/dL (ref 8.9–10.3)
Chloride: 109 mmol/L (ref 98–111)
Creatinine, Ser: 1 mg/dL (ref 0.61–1.24)
GFR, Estimated: >60 mL/min (ref >60)
Glucose, Bld: 102 mg/dL — ABNORMAL HIGH (ref 70–99)
Potassium: 4.1 mmol/L (ref 3.5–5.1)
Sodium: 140 mmol/L (ref 135–145)

## 2022-11-01 LAB — SYNOVIAL CELL COUNT + DIFF, W/ CRYSTALS
Eosinophils-Synovial: 0 % (ref 0–1)
Lymphocytes-Synovial Fld: 9 % (ref 0–20)
Monocyte-Macrophage-Synovial Fluid: 91 % — ABNORMAL HIGH (ref 50–90)
Neutrophil, Synovial: 0 % (ref 0–25)
WBC, Synovial: 218 /mm3 — ABNORMAL HIGH (ref 0–200)

## 2022-11-01 LAB — CBC
HCT: 43 % (ref 39.0–52.0)
Hemoglobin: 14.2 g/dL (ref 13.0–17.0)
MCH: 27.5 pg (ref 26.0–34.0)
MCHC: 33 g/dL (ref 30.0–36.0)
MCV: 83.3 fL (ref 80.0–100.0)
Platelets: 200 10*3/uL (ref 150–400)
RBC: 5.16 MIL/uL (ref 4.22–5.81)
RDW: 14.6 % (ref 11.5–15.5)
WBC: 8.7 10*3/uL (ref 4.0–10.5)
nRBC: 0 % (ref 0.0–0.2)

## 2022-11-01 MED ORDER — INDOMETHACIN 25 MG PO CAPS
50.0000 mg | ORAL_CAPSULE | Freq: Three times a day (TID) | ORAL | Status: DC
  Administered 2022-11-01: 50 mg via ORAL
  Filled 2022-11-01 (×3): qty 2

## 2022-11-01 MED ORDER — LIDOCAINE HCL (PF) 1 % IJ SOLN
5.0000 mL | Freq: Once | INTRAMUSCULAR | Status: AC
  Administered 2022-11-01: 5 mL via INTRADERMAL

## 2022-11-01 MED ORDER — HYDROXYZINE HCL 25 MG PO TABS
25.0000 mg | ORAL_TABLET | Freq: Four times a day (QID) | ORAL | Status: DC | PRN
  Administered 2022-11-01 – 2022-11-02 (×3): 25 mg via ORAL
  Filled 2022-11-01 (×3): qty 1

## 2022-11-01 MED ORDER — PANTOPRAZOLE SODIUM 40 MG PO TBEC
40.0000 mg | DELAYED_RELEASE_TABLET | Freq: Every day | ORAL | Status: DC
  Administered 2022-11-01: 40 mg via ORAL
  Filled 2022-11-01: qty 1

## 2022-11-01 MED ORDER — METHOCARBAMOL 500 MG PO TABS
500.0000 mg | ORAL_TABLET | Freq: Three times a day (TID) | ORAL | Status: DC | PRN
  Administered 2022-11-02: 500 mg via ORAL
  Filled 2022-11-01: qty 1

## 2022-11-01 MED ORDER — SERTRALINE HCL 25 MG PO TABS
25.0000 mg | ORAL_TABLET | Freq: Every day | ORAL | Status: DC
  Administered 2022-11-01: 25 mg via ORAL
  Filled 2022-11-01 (×2): qty 1

## 2022-11-01 MED ORDER — TRAZODONE HCL 50 MG PO TABS
50.0000 mg | ORAL_TABLET | Freq: Every day | ORAL | Status: DC
  Administered 2022-11-01: 50 mg via ORAL
  Filled 2022-11-01: qty 1

## 2022-11-01 NOTE — Plan of Care (Signed)

## 2022-11-01 NOTE — Procedures (Signed)
Interventional Radiology Procedure Note  Risks and benefits of joint aspiration were discussed with the patient including, but not limited to bleeding, infection, unable to obtain fluid, and damage to adjacent structures.  All of the patient's questions were answered, patient is agreeable to proceed. Consent signed and in chart.  A timeout was performed with all members of the team prior to start of the procedure. Correct patient and correct procedure was confirmed. Allergies were reviewed.   PROCEDURE SUMMARY:  Successful fluoro guided right knee aspiration yielding 5 mL of clear straw colored fluid. Sample was capped and sent to lab. No immediate complications.  Pt tolerated well.   EBL = none  Please see full dictation in imaging section of Epic for procedure details.
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
## 2022-11-01 NOTE — Progress Notes (Signed)
PROGRESS NOTE        PATIENT DETAILS Name: Nicholas [name] Age: 52 y.o. Sex: male Date of Birth: [birth year] Admit Date: 10/26/2022 Admitting Physician Dewayne Shorter Levora Dredge, MD PCP:Pcp, No  Brief Summary: Patient is a 52 y.o.  male with history of polysubstance abuse (heroin/Suboxone/oral narcotics/EtOH use)-who signed out AMA on 9/27 after presenting with vertigo-presented back to the ED on 9/27 evening with vertigo and just not feeling well.  Acknowledges taking a "pain pill" after he left the hospital AMA.  Patient was subsequently admitted to the hospitalist service-post admission-1/2 set of blood cultures positive for fungemia.  Significant events: 9/26-9/27>> hospitalized for evaluation of vertigo-left AMA 9/27>> readmit to [facility]  Significant studies: 9/25>> x-ray right wrist: No acute fracture 9/25>> x-ray right knee: No fracture 9/26>> CT angio head/neck: No LVO or hemodynamically significant stenosis. 9/26>> UDS:+ Cocaine 9/27>> UDS:+ Cocaine/opiates (acknowledges taking a pain pill after he signed out AMA) 9/27>> CXR: No PNA 9/29>> MRI right knee: No evidence of septic arthritis/osteomyelitis. 9/30>> echo: EF 55-60%, grade 1 diastolic dysfunction, intra-atrial septal aneurysm with associated PFO  Significant microbiology data: 9/27>> blood culture: Candida albicans 9/27>> COVID/influenza/RSV PCR: Negative 9/30>> blood culture: No growth  Procedures: 10/3>> fluoroscopy-guided knee aspiration by IR.  Consults: Infectious disease Ophthalmology  Subjective: Very anxious-some sweats.  Some pain continues in the right knee area.  Objective: Vitals: Blood pressure (!) 141/90, pulse 72, temperature 97.6 F (36.4 C), temperature source Oral, resp. rate 15, height 6' (1.829 m), weight 68 kg, SpO2 95%.   Exam: Gen Exam:Alert awake-not in any distress HEENT:atraumatic, normocephalic Chest: B/L clear to auscultation anteriorly CVS:S1S2  regular Abdomen:soft non tender, non distended Extremities:no edema-mild swelling/tenderness in right knee area but no overlying erythema. Neurology: Non focal Skin: no rash  Pertinent Labs/Radiology:    Latest Ref Rng & Units 11/01/2022    4:32 AM 10/29/2022    4:43 AM 10/28/2022   10:10 PM  CBC  WBC 4.0 - 10.5 K/uL 8.7  6.1  7.0   Hemoglobin 13.0 - 17.0 g/dL 16.1  09.6  04.5   Hematocrit 39.0 - 52.0 % 43.0  38.6  35.5   Platelets 150 - 400 K/uL 200  198  184     Lab Results  Component Value Date   NA 140 11/01/2022   K 4.1 11/01/2022   CL 109 11/01/2022   CO2 22 11/01/2022      Assessment/Plan: Hypotension Likely due to hypovolemia BP now stable with supportive care.  Fungemia In the setting of drug use-when asked on 9/29-he acknowledges intermittent IV drug use Ophthalmology eval on 9/29-no evidence of eye involvement TEE negative for endocarditis Repeat blood cultures negative Due to worsening right knee pain-arthrocentesis done today-await results On fluconazole ID following.  Vertigo Concern for BPPV-as some of it appears positional Resolved with supportive care and as needed meclizine. Reviewed prior progress note from Dr. Raeanne Gathers to obtain MRI due to metallic object embedded in his chest.  AKI Suspect this is hemodynamically mediated Minimal proteinuria Creatinine has normalized Follow periodically.  Polysubstance abuse (IV drug use) EtOH use Acknowledges doing cocaine-pain pills and doing heroin when he cannot get pain pills (apparently addicted to narcotics since recent trauma resulting in jaw fracture several months ago).  On 9/29-acknowledges occasional IV drug use. Significant improvement in withdrawal symptoms with clonidine/Ativan taper Patient interested in inpatient  rehab-discussed with social worker-since he has been detoxed-he has been denied inpatient rehab from the facility he desired. Avoid narcotics/Benzos  Borderline vitamin B12  deficiency Continue supplementation SQ x 7 days-and then transition to oral B12 on discharge-patient to follow-up with PCP in 6 weeks for repeat B12 levels.  Interatrial septal aneurysm with associated PFO Incidental finding on echo Outpatient follow-up with cardiology  Anxiety disorder Psych consulted 10/3-Zoloft/trazodone started.  Brief/bereavement Brother died over the weekend with overdose in [place].  BMI: Estimated body mass index is 20.33 kg/m as calculated from the following:   Height as of this encounter: 6' (1.829 m).   Weight as of this encounter: 68 kg.   Code status:   Code Status: Full Code   DVT Prophylaxis: enoxaparin (LOVENOX) injection 40 mg Start: 10/27/22 1400   Family Communication: None at bedside   Disposition Plan: Status is: Observation The patient will require care spanning > 2 midnights and should be moved to inpatient because: Severity of illness   Planned Discharge Destination:Home   Diet: Diet Order             Diet regular Room service appropriate? Yes; Fluid consistency: Thin  Diet effective now                     Antimicrobial agents: Anti-infectives (From admission, onward)    Start     Dose/Rate Route Frequency Ordered Stop   10/31/22 1000  fluconazole (DIFLUCAN) tablet 400 mg       Placed in "Followed by" Linked Group   400 mg Oral Daily 10/29/22 1122 11/11/22 2359   10/30/22 0930  fluconazole (DIFLUCAN) tablet 800 mg       Placed in "Followed by" Linked Group   800 mg Oral  Once 10/29/22 1122 10/30/22 0919   10/28/22 0800  micafungin (MYCAMINE) 150 mg in sodium chloride 0.9 % 100 mL IVPB  Status:  Discontinued        150 mg 107.5 mL/hr over 1 Hours Intravenous Every 24 hours 10/28/22 0657 10/29/22 1122   10/26/22 2045  vancomycin (VANCOREADY) IVPB 1500 mg/300 mL        1,500 mg 150 mL/hr over 120 Minutes Intravenous  Once 10/26/22 2030 10/27/22 0120   10/26/22 2030  ceFEPIme (MAXIPIME) 2 g in  sodium chloride 0.9 % 100 mL IVPB        2 g 200 mL/hr over 30 Minutes Intravenous  Once 10/26/22 2028 10/26/22 2130   10/26/22 2030  metroNIDAZOLE (FLAGYL) IVPB 500 mg        500 mg 100 mL/hr over 60 Minutes Intravenous  Once 10/26/22 2028 10/27/22 0259   10/26/22 2030  vancomycin (VANCOCIN) IVPB 1000 mg/200 mL premix  Status:  Discontinued        1,000 mg 200 mL/hr over 60 Minutes Intravenous  Once 10/26/22 2028 10/26/22 2030        MEDICATIONS: Scheduled Meds:  acetaminophen  1,000 mg Oral Q8H   cyanocobalamin  1,000 mcg Subcutaneous Daily   enoxaparin (LOVENOX) injection  40 mg Subcutaneous Q24H   fluconazole  400 mg Oral Daily   folic acid  1 mg Oral Daily   multivitamin with minerals  1 tablet Oral Daily   nicotine  21 mg Transdermal Daily   sertraline  25 mg Oral Daily   thiamine  100 mg Oral Daily   traZODone  50 mg Oral QHS   Continuous Infusions:   PRN Meds:.ketorolac, meclizine, ondansetron **OR** ondansetron (  ZOFRAN) IV, mouth rinse   I have personally reviewed following labs and imaging studies  LABORATORY DATA: CBC: Recent Labs  Lab 10/26/22 2022 10/27/22 0442 10/28/22 2210 10/29/22 0443 11/01/22 0432  WBC 15.7* 10.3 7.0 6.1 8.7  NEUTROABS 12.4*  --   --   --   --   HGB 14.3 11.1* 11.5* 12.2* 14.2  HCT 43.8 35.4* 35.5* 38.6* 43.0  MCV 87.4 86.8 86.0 86.0 83.3  PLT 320 210 184 198 200    Basic Metabolic Panel: Recent Labs  Lab 10/27/22 0442 10/28/22 1523 10/29/22 0443 10/30/22 0404 11/01/22 0432  NA 137 138 140 139 140  K 3.9 4.6 4.4 4.0 4.1  CL 107 107 109 109 109  CO2 22 21* 24 21* 22  GLUCOSE 103* 108* 95 96 102*  BUN 28* 26* 21* 22* 18  CREATININE 2.22* 1.16 1.31* 1.05 1.00  CALCIUM 8.2* 8.2* 8.7* 8.8* 8.9    GFR: Estimated Creatinine Clearance: 81.2 mL/min (by C-G formula based on SCr of 1 mg/dL).  Liver Function Tests: Recent Labs  Lab 10/26/22 2022 10/28/22 2210 10/29/22 0443  AST 17 17 17   ALT 16 14 15   ALKPHOS 70  52 57  BILITOT 0.4 0.3 <0.1*  PROT 7.7 5.7* 6.1*  ALBUMIN 4.0 2.9* 3.1*   No results for input(s): "LIPASE", "AMYLASE" in the last 168 hours. No results for input(s): "AMMONIA" in the last 168 hours.  Coagulation Profile: Recent Labs  Lab 10/26/22 2045  INR 1.2    Cardiac Enzymes: No results for input(s): "CKTOTAL", "CKMB", "CKMBINDEX", "TROPONINI" in the last 168 hours.  BNP (last 3 results) No results for input(s): "PROBNP" in the last 8760 hours.  Lipid Profile: No results for input(s): "CHOL", "HDL", "LDLCALC", "TRIG", "CHOLHDL", "LDLDIRECT" in the last 72 hours.  Thyroid Function Tests: No results for input(s): "TSH", "T4TOTAL", "FREET4", "T3FREE", "THYROIDAB" in the last 72 hours.  Anemia Panel: No results for input(s): "VITAMINB12", "FOLATE", "FERRITIN", "TIBC", "IRON", "RETICCTPCT" in the last 72 hours.   Urine analysis:    Component Value Date/Time   COLORURINE YELLOW 10/27/2022 0147   APPEARANCEUR CLEAR 10/27/2022 0147   LABSPEC 1.013 10/27/2022 0147   PHURINE 7.0 10/27/2022 0147   GLUCOSEU 50 (A) 10/27/2022 0147   HGBUR NEGATIVE 10/27/2022 0147   BILIRUBINUR NEGATIVE 10/27/2022 0147   KETONESUR 5 (A) 10/27/2022 0147   PROTEINUR 30 (A) 10/27/2022 0147   UROBILINOGEN 0.2 02/01/2012 1041   NITRITE NEGATIVE 10/27/2022 0147   LEUKOCYTESUR NEGATIVE 10/27/2022 0147    Sepsis Labs: Lactic Acid, Venous    Component Value Date/Time   LATICACIDVEN 1.8 10/26/2022 2034    MICROBIOLOGY: Recent Results (from the past 240 hour(s))  Blood Culture (routine x 2)     Status: Abnormal (Preliminary result)   Collection Time: 10/26/22  8:45 PM   Specimen: BLOOD  Result Value Ref Range Status   Specimen Description BLOOD SITE NOT SPECIFIED  Final   Special Requests   Final    BOTTLES DRAWN AEROBIC AND ANAEROBIC Blood Culture adequate volume   Culture  Setup Time   Final    YEAST AEROBIC BOTTLE ONLY CRITICAL RESULT CALLED TO, READ BACK BY AND VERIFIED WITH: J  WYLAND,PHARMD@[id number]  10/28/22 MK    Culture (A)  Final    CANDIDA ALBICANS Sent to [facility] for further susceptibility testing. Performed at [facility], [address], [place], [place] [address]    Report Status PENDING  Incomplete  Blood Culture ID Panel (Reflexed)  Status: Abnormal   Collection Time: 10/26/22  8:45 PM  Result Value Ref Range Status   Enterococcus faecalis NOT DETECTED NOT DETECTED Final   Enterococcus Faecium NOT DETECTED NOT DETECTED Final   Listeria monocytogenes NOT DETECTED NOT DETECTED Final   Staphylococcus species NOT DETECTED NOT DETECTED Final   Staphylococcus aureus (BCID) NOT DETECTED NOT DETECTED Final   Staphylococcus epidermidis NOT DETECTED NOT DETECTED Final   Staphylococcus lugdunensis NOT DETECTED NOT DETECTED Final   Streptococcus species NOT DETECTED NOT DETECTED Final   Streptococcus agalactiae NOT DETECTED NOT DETECTED Final   Streptococcus pneumoniae NOT DETECTED NOT DETECTED Final   Streptococcus pyogenes NOT DETECTED NOT DETECTED Final   A.calcoaceticus-baumannii NOT DETECTED NOT DETECTED Final   Bacteroides fragilis NOT DETECTED NOT DETECTED Final   Enterobacterales NOT DETECTED NOT DETECTED Final   Enterobacter cloacae complex NOT DETECTED NOT DETECTED Final   Escherichia coli NOT DETECTED NOT DETECTED Final   Klebsiella aerogenes NOT DETECTED NOT DETECTED Final   Klebsiella oxytoca NOT DETECTED NOT DETECTED Final   Klebsiella pneumoniae NOT DETECTED NOT DETECTED Final   Proteus species NOT DETECTED NOT DETECTED Final   Salmonella species NOT DETECTED NOT DETECTED Final   Serratia marcescens NOT DETECTED NOT DETECTED Final   Haemophilus influenzae NOT DETECTED NOT DETECTED Final   Neisseria meningitidis NOT DETECTED NOT DETECTED Final   Pseudomonas aeruginosa NOT DETECTED NOT DETECTED Final   Stenotrophomonas maltophilia NOT DETECTED NOT DETECTED Final   Candida albicans DETECTED (A) NOT DETECTED Final    Comment: CRITICAL  RESULT CALLED TO, READ BACK BY AND VERIFIED WITH: J WYLAND,PHARMD@0713  10/28/22 MK    Candida auris NOT DETECTED NOT DETECTED Final   Candida glabrata NOT DETECTED NOT DETECTED Final   Candida krusei NOT DETECTED NOT DETECTED Final   Candida parapsilosis NOT DETECTED NOT DETECTED Final   Candida tropicalis NOT DETECTED NOT DETECTED Final   Cryptococcus neoformans/gattii NOT DETECTED NOT DETECTED Final    Comment: Performed at [facility], [address] [place], [place] [address]  Blood Culture (routine x 2)     Status: None   Collection Time: 10/26/22  8:50 PM   Specimen: BLOOD  Result Value Ref Range Status   Specimen Description BLOOD SITE NOT SPECIFIED  Final   Special Requests   Final    BOTTLES DRAWN AEROBIC AND ANAEROBIC Blood Culture results may not be optimal due to an inadequate volume of blood received in culture bottles   Culture   Final    NO GROWTH 5 DAYS Performed at [facility], [address], [place], [place] [address]    Report Status 10/31/2022 FINAL  Final  Resp panel by RT-PCR (RSV, Flu A&B, Covid) Anterior Nasal Swab     Status: None   Collection Time: 10/26/22  9:55 PM   Specimen: Anterior Nasal Swab  Result Value Ref Range Status   SARS Coronavirus 2 by RT PCR NEGATIVE NEGATIVE Final   Influenza A by PCR NEGATIVE NEGATIVE Final   Influenza B by PCR NEGATIVE NEGATIVE Final    Comment: (NOTE) The Xpert Xpress SARS-CoV-2/FLU/RSV plus assay is intended as an aid in the diagnosis of influenza from Nasopharyngeal swab specimens and should not be used as a sole basis for treatment. Nasal washings and aspirates are unacceptable for Xpert Xpress SARS-CoV-2/FLU/RSV testing.  Fact Sheet for Patients: BloggerCourse.com  Fact Sheet for Healthcare Providers: SeriousBroker.it  This test is not yet approved or cleared by the [place] and  has been authorized for detection and/or diagnosis of  SARS-CoV-2 by FDA under an Emergency Use Authorization (EUA). This EUA will remain in effect (meaning this test can be used) for the duration of the COVID-19 declaration under Section 564(b)(1) of the Act, 21 U.S.C. section 360bbb-3(b)(1), unless the authorization is terminated or revoked.     Resp Syncytial Virus by PCR NEGATIVE NEGATIVE Final    Comment: (NOTE) Fact Sheet for Patients: BloggerCourse.com  Fact Sheet for Healthcare Providers: SeriousBroker.it  This test is not yet approved or cleared by the [place] FDA and has been authorized for detection and/or diagnosis of SARS-CoV-2 by FDA under an Emergency Use Authorization (EUA). This EUA will remain in effect (meaning this test can be used) for the duration of the COVID-19 declaration under Section 564(b)(1) of the Act, 21 U.S.C. section 360bbb-3(b)(1), unless the authorization is terminated or revoked.  Performed at [facility], [address] [place], [place] [address]   Culture, blood (Routine X 2) w Reflex to ID Panel     Status: None (Preliminary result)   Collection Time: 10/29/22  4:42 AM   Specimen: BLOOD LEFT ARM  Result Value Ref Range Status   Specimen Description BLOOD LEFT ARM  Final   Special Requests   Final    BOTTLES DRAWN AEROBIC AND ANAEROBIC Blood Culture results may not be optimal due to an excessive volume of blood received in culture bottles   Culture   Final    NO GROWTH 2 DAYS Performed at [facility], [address] [place], [place] [address]    Report Status PENDING  Incomplete  Culture, blood (Routine X 2) w Reflex to ID Panel     Status: None (Preliminary result)   Collection Time: 10/29/22  4:45 AM   Specimen: BLOOD LEFT ARM  Result Value Ref Range Status   Specimen Description BLOOD LEFT ARM  Final   Special Requests   Final    BOTTLES DRAWN AEROBIC ONLY Blood Culture results may not be optimal due to an excessive  volume of blood received in culture bottles   Culture   Final    NO GROWTH 2 DAYS Performed at [facility], [address] [place], [place] [address]    Report Status PENDING  Incomplete    RADIOLOGY STUDIES/RESULTS: ECHO TEE  Result Date: 10/30/2022    TRANSESOPHOGEAL ECHO REPORT   Patient Name:   SHLOMIE ROMIG Date of Exam: 10/30/2022 Medical Rec #:  [id number]       Height:       72.0 in Accession #:    [id number]      Weight:       149.9 lb Date of Birth:  1970-12-27        BSA:          1.885 m Patient Age:    52 years        BP:           122/82 mmHg Patient Gender: M               HR:           61 bpm. Exam Location:  Inpatient Procedure: Transesophageal Echo, Color Doppler and Cardiac Doppler Indications:     Bacteremia  History:         Patient has prior history of Echocardiogram examinations, most  recent 10/29/2022. Risk Factors:Polysubstance abuse.  Sonographer:     Thurman Coyer RDCS Referring Phys:  [id number] Alben Spittle BRANCH Diagnosing Phys: [place] PROCEDURE: The transesophogeal probe was passed without difficulty through the esophogus of the patient. Sedation performed by different physician. The patient was monitored while under deep sedation. Anesthestetic sedation was provided intravenously by Anesthesiology: 68mg  of Propofol, 60mg  of Lidocaine. The patient developed no complications during the procedure.  IMPRESSIONS  1. Left ventricular ejection fraction, by estimation, is 60 to 65%. The left ventricle has normal function.  2. Right ventricular systolic function is normal. The right ventricular size is normal.  3. No left atrial/left atrial appendage thrombus was detected.  4. The mitral valve is normal in structure. Trivial mitral valve regurgitation.  5. The aortic valve is tricuspid. Aortic valve regurgitation is not visualized. Conclusion(s)/Recommendation(s): No evidence of vegetation/infective endocarditis on this transesophageael echocardiogram.  FINDINGS  Left Ventricle: Left ventricular ejection fraction, by estimation, is 60 to 65%. The left ventricle has normal function. The left ventricular internal cavity size was normal in size. Right Ventricle: The right ventricular size is normal. Right ventricular systolic function is normal. Left Atrium: No left atrial/left atrial appendage thrombus was detected. Pericardium: There is no evidence of pericardial effusion. Mitral Valve: The mitral valve is normal in structure. Trivial mitral valve regurgitation. Tricuspid Valve: The tricuspid valve is normal in structure. Tricuspid valve regurgitation is not demonstrated. Aortic Valve: The aortic valve is tricuspid. Aortic valve regurgitation is not visualized. Pulmonic Valve: The pulmonic valve was normal in structure. Pulmonic valve regurgitation is not visualized. Aorta: The aortic root is normal in size and structure. IAS/Shunts: No atrial level shunt detected by color flow Doppler. Carolan Clines Electronically signed by Carolan Clines Signature Date/Time: 10/30/2022/1:12:23 PM    Final    EP STUDY  Result Date: 10/30/2022 See surgical note for result.    LOS: 5 days   Jeoffrey Massed, MD  [facility]    To contact the attending provider between 7A-7P or the covering provider during after hours 7P-7A, please log into the web site [email or web address] and access using universal [place] password for that web site. If you do not have the password, please call the hospital operator.  11/01/2022, 10:56 AM

## 2022-11-01 NOTE — TOC Progression Note (Signed)
Transition of Care [facility]) - Progression Note    Patient Details  Name: Nicholas [name] MRN: [id number] Date of Birth: 1970/03/17  Transition of [facility]) CM/SW Contact  Mearl Latin, LCSW Phone Number: 11/01/2022, 12:34 PM  Clinical Narrative:    CSW was approached by patient in the hall and asked what Tawanna Cooler had told CSW. CSW explained that he had been detoxed already and patient stated that Tawanna Cooler did not tell him that. CSW provided supportive listening.    Expected Discharge Plan: Home/Self Care Barriers to Discharge: Continued Medical Work up  Expected Discharge Plan and Services In-house Referral: Clinical Social Work     Living arrangements for the past 2 months: Single Family Home                                       Social Determinants of Health (SDOH) Interventions SDOH Screenings   Food Insecurity: No Food Insecurity (10/30/2022)  Housing: Low Risk  (10/30/2022)  Transportation Needs: No Transportation Needs (10/30/2022)  Recent Concern: Transportation Needs - Unmet Transportation Needs (08/01/2022)  Utilities: Not At Risk (10/30/2022)  Depression (PHQ2-9): High Risk (01/30/2022)  Tobacco Use: High Risk (10/28/2022)    Readmission Risk Interventions     No data to display

## 2022-11-02 ENCOUNTER — Inpatient Hospital Stay (HOSPITAL_COMMUNITY): Payer: MEDICAID

## 2022-11-02 DIAGNOSIS — F1910 Other psychoactive substance abuse, uncomplicated: Secondary | ICD-10-CM | POA: Diagnosis not present

## 2022-11-02 DIAGNOSIS — F1010 Alcohol abuse, uncomplicated: Secondary | ICD-10-CM | POA: Diagnosis not present

## 2022-11-02 DIAGNOSIS — F1410 Cocaine abuse, uncomplicated: Secondary | ICD-10-CM | POA: Diagnosis not present

## 2022-11-02 DIAGNOSIS — F330 Major depressive disorder, recurrent, mild: Secondary | ICD-10-CM | POA: Diagnosis not present

## 2022-11-02 DIAGNOSIS — F1110 Opioid abuse, uncomplicated: Secondary | ICD-10-CM | POA: Diagnosis not present

## 2022-11-02 DIAGNOSIS — N179 Acute kidney failure, unspecified: Secondary | ICD-10-CM | POA: Diagnosis not present

## 2022-11-02 DIAGNOSIS — B49 Unspecified mycosis: Secondary | ICD-10-CM | POA: Diagnosis not present

## 2022-11-02 LAB — ANTIFUNGAL AST 9 DRUG PANEL
Amphotericin B MIC: 0.5
Fluconazole Islt MIC: 1
Flucytosine MIC: 0.12
Itraconazole MIC: 0.06
Posaconazole MIC: 0.03

## 2022-11-02 LAB — MISC LABCORP TEST (SEND OUT): Labcorp test code: 183119

## 2022-11-02 LAB — CBC
HCT: 45.2 % (ref 39.0–52.0)
Hemoglobin: 14.6 g/dL (ref 13.0–17.0)
MCH: 27.1 pg (ref 26.0–34.0)
MCHC: 32.3 g/dL (ref 30.0–36.0)
MCV: 84 fL (ref 80.0–100.0)
Platelets: 205 10*3/uL (ref 150–400)
RBC: 5.38 MIL/uL (ref 4.22–5.81)
RDW: 14.6 % (ref 11.5–15.5)
WBC: 8.6 10*3/uL (ref 4.0–10.5)
nRBC: 0 % (ref 0.0–0.2)

## 2022-11-02 LAB — COMPREHENSIVE METABOLIC PANEL
ALT: 20 U/L (ref 0–44)
AST: 25 U/L (ref 15–41)
Albumin: 3.9 g/dL (ref 3.5–5.0)
Alkaline Phosphatase: 64 U/L (ref 38–126)
Anion gap: 11 (ref 5–15)
BUN: 21 mg/dL — ABNORMAL HIGH (ref 6–20)
CO2: 20 mmol/L — ABNORMAL LOW (ref 22–32)
Calcium: 9.3 mg/dL (ref 8.9–10.3)
Chloride: 110 mmol/L (ref 98–111)
Creatinine, Ser: 1.12 mg/dL (ref 0.61–1.24)
GFR, Estimated: >60 mL/min (ref >60)
Glucose, Bld: 151 mg/dL — ABNORMAL HIGH (ref 70–99)
Potassium: 4.6 mmol/L (ref 3.5–5.1)
Sodium: 141 mmol/L (ref 135–145)
Total Bilirubin: 0.6 mg/dL (ref 0.3–1.2)
Total Protein: 7.3 g/dL (ref 6.5–8.1)

## 2022-11-02 LAB — LIPASE, BLOOD: Lipase: 34 U/L (ref 11–51)

## 2022-11-02 MED ORDER — SERTRALINE HCL 25 MG PO TABS: 25.0000 mg | ORAL_TABLET | Freq: Every day | ORAL | Status: DC

## 2022-11-02 MED ORDER — FLUCONAZOLE IN SODIUM CHLORIDE 400-0.9 MG/200ML-% IV SOLN
400.0000 mg | INTRAVENOUS | Status: DC
  Administered 2022-11-02: 400 mg via INTRAVENOUS
  Filled 2022-11-02 (×2): qty 200

## 2022-11-02 MED ORDER — SODIUM CHLORIDE 0.9 % IV SOLN: INTRAVENOUS | Status: DC

## 2022-11-02 MED ORDER — IOHEXOL 350 MG/ML SOLN
60.0000 mL | Freq: Once | INTRAVENOUS | Status: AC | PRN
  Administered 2022-11-02: 60 mL via INTRAVENOUS

## 2022-11-02 MED ORDER — LORAZEPAM 2 MG/ML IJ SOLN
2.0000 mg | Freq: Once | INTRAMUSCULAR | Status: AC
  Administered 2022-11-02: 2 mg via INTRAMUSCULAR
  Filled 2022-11-02: qty 1

## 2022-11-02 MED ORDER — SODIUM CHLORIDE 0.9 % IV SOLN
25.0000 mg | Freq: Four times a day (QID) | INTRAVENOUS | Status: DC | PRN
  Administered 2022-11-02: 25 mg via INTRAVENOUS
  Filled 2022-11-02: qty 25

## 2022-11-02 MED ORDER — SERTRALINE HCL 50 MG PO TABS: 50.0000 mg | ORAL_TABLET | Freq: Every day | ORAL | Status: DC

## 2022-11-02 MED ORDER — MELATONIN 5 MG PO TABS
5.0000 mg | ORAL_TABLET | Freq: Every evening | ORAL | Status: DC | PRN
  Administered 2022-11-02: 5 mg via ORAL
  Filled 2022-11-02: qty 1

## 2022-11-02 MED ORDER — SODIUM CHLORIDE 0.9 % IV SOLN
12.5000 mg | Freq: Four times a day (QID) | INTRAVENOUS | Status: DC | PRN
  Filled 2022-11-02: qty 0.5

## 2022-11-02 MED ORDER — CALCIUM CARBONATE ANTACID 500 MG PO CHEW
1.0000 | CHEWABLE_TABLET | Freq: Three times a day (TID) | ORAL | Status: DC | PRN
  Administered 2022-11-02 (×2): 200 mg via ORAL
  Filled 2022-11-02 (×2): qty 1

## 2022-11-02 MED ORDER — PANTOPRAZOLE SODIUM 40 MG IV SOLR
40.0000 mg | Freq: Two times a day (BID) | INTRAVENOUS | Status: DC
  Administered 2022-11-02 – 2022-11-03 (×3): 40 mg via INTRAVENOUS
  Filled 2022-11-02 (×3): qty 10

## 2022-11-02 MED ORDER — IOHEXOL 350 MG/ML SOLN
50.0000 mL | Freq: Once | INTRAVENOUS | Status: AC | PRN
  Administered 2022-11-02: 50 mL via INTRAVENOUS
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
## 2022-11-02 NOTE — Progress Notes (Signed)
[facility]    Date of Admission:  10/26/2022     ID: Nicholas [name] is a 52 y.o. male with  fungemia Principal Problem:   Fungemia Active Problems:   Polysubstance abuse (HCC)   Cocaine abuse (HCC)   Opioid abuse (HCC)   Alcohol abuse   AKI (acute kidney injury) (HCC)    Subjective: Afebrile, found to have pseudogout in knee aspirate. Had episode of bilious emesis starting since last night, he attributes to his medications  Medications:   acetaminophen  1,000 mg Oral Q8H   cyanocobalamin  1,000 mcg Subcutaneous Daily   enoxaparin (LOVENOX) injection  40 mg Subcutaneous Q24H   folic acid  1 mg Oral Daily   multivitamin with minerals  1 tablet Oral Daily   nicotine  21 mg Transdermal Daily   pantoprazole (PROTONIX) IV  40 mg Intravenous Q12H   thiamine  100 mg Oral Daily    Objective: Vital signs in last 24 hours: Temp:  [97.7 F (36.5 C)-98.8 F (37.1 C)] 97.7 F (36.5 C) (10/04 1650) Pulse Rate:  [74] 74 (10/03 2000) Resp:  [20-26] 20 (10/04 1650) BP: (114-138)/(95-103) 138/103 (10/04 1650) SpO2:  [100 %] 100 % (10/03 2000)  Physical Exam  Constitutional: He is oriented to person, place, and time. He appears chronically ill and undernourished. No distress.  HENT:  Mouth/Throat: Oropharynx is clear and moist. No oropharyngeal exudate.  Cardiovascular: Normal rate, regular rhythm and normal heart sounds. Exam reveals no gallop and no friction rub.  No murmur heard.  Pulmonary/Chest: Effort normal and breath sounds normal. No respiratory distress. He has no wheezes.  Abdominal: Soft. Bowel sounds are normal. He exhibits no distension. There is no tenderness.  Lymphadenopathy:  He has no cervical adenopathy.  Neurological: He is alert and oriented to person, place, and time.  Skin: Skin is warm and dry. No rash noted. No erythema.  Psychiatric: sleepy   Lab Results Recent Labs    11/01/22 0432 11/02/22 0831 11/02/22 1035  WBC  8.7  --  8.6  HGB 14.2  --  14.6  HCT 43.0  --  45.2  NA 140 141  --   K 4.1 4.6  --   CL 109 110  --   CO2 22 20*  --   BUN 18 21*  --   CREATININE 1.00 1.12  --    Liver Panel Recent Labs    11/02/22 0831  PROT 7.3  ALBUMIN 3.9  AST 25  ALT 20  ALKPHOS 64  BILITOT 0.6   Sedimentation Rate No results for input(s): "ESRSEDRATE" in the last 72 hours. C-Reactive Protein No results for input(s): "CRP" in the last 72 hours.  Microbiology: 9/28 blood cx ngtd 10/02 aspirate cx ngtd, but early Studies/Results: CT ABDOMEN PELVIS W CONTRAST  Result Date: 11/02/2022 CLINICAL DATA:  Abdominal pain, acute, nonlocalized EXAM: CT ABDOMEN AND PELVIS WITH CONTRAST TECHNIQUE: Multidetector CT imaging of the abdomen and pelvis was performed using the standard protocol following bolus administration of intravenous contrast. RADIATION DOSE REDUCTION: This exam was performed according to the departmental dose-optimization program which includes automated exposure control, adjustment of the mA and/or kV according to patient size and/or use of iterative reconstruction technique. CONTRAST:  60mL OMNIPAQUE IOHEXOL 350 MG/ML SOLN, 50mL OMNIPAQUE IOHEXOL 350 MG/ML SOLN COMPARISON:  CT scan abdomen and pelvis from 07/31/2022. FINDINGS: Lower chest: There are dependent changes in the visualized lung bases. No overt consolidation. No pleural effusion.  The heart is normal in size. No pericardial effusion. Hepatobiliary: Stable mild-to-moderate hepatomegaly (craniocaudal length of 19.7 cm). Non-cirrhotic configuration. No suspicious mass. No intrahepatic or extrahepatic bile duct dilation. No calcified gallstones. Normal gallbladder wall thickness. No pericholecystic inflammatory changes. Pancreas: No focal mass. Redemonstration of dilated main pancreatic duct measuring up to 6 mm in diameter, in the head region, similar to several prior studies. No peripancreatic fat stranding. Spleen: Within normal limits. No  focal lesion. Adrenals/Urinary Tract: There is diffuse thickening of bilateral adrenal glands, without discrete nodule. Findings are nonspecific but mostly associated with adrenal hyperplasia. No significant interval change. No suspicious renal mass. 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 No hydronephrosis. No renal or ureteric calculi. Unremarkable urinary bladder. Stomach/Bowel: No disproportionate dilation of the small or large bowel loops. No evidence of abnormal bowel wall thickening or inflammatory changes. The appendix is unremarkable. Vascular/Lymphatic: No ascites or pneumoperitoneum. No abdominal or pelvic lymphadenopathy, by size criteria. No aneurysmal dilation of the major abdominal arteries. There are moderate peripheral atherosclerotic vascular calcifications of the aorta and its major branches. Reproductive: Normal size prostate. Symmetric seminal vesicles. Other: The visualized soft tissues and abdominal wall are unremarkable. Musculoskeletal: No suspicious osseous lesions. There are mild multilevel degenerative changes in the visualized spine. IMPRESSION: 1. No acute inflammatory process identified within the abdomen or pelvis. 2. Multiple other nonacute observations, as described above. Aortic Atherosclerosis (ICD10-I70.0). Electronically Signed   By: Jules Schick M.D.   On: 11/02/2022 14:31   DG FL ASP/INJ MAJOR (SHOULDER, HIP, KNEE)  Result Date: 11/01/2022 INDICATION: Right knee joint effusion. Fungemia with worsening right knee pain. Concern for septic arthritis. EXAM: ARTHROCENTESIS/INJECTION OF LARGE JOINT COMPARISON:  Right knee MRI 10/28/2022 CONTRAST:  None FLUOROSCOPY TIME:  Radiation Exposure Index (as provided by the fluoroscopic device): 0.10 mGy Kerma COMPLICATIONS: None immediate PROCEDURE: Informed written consent was obtained from the patient after discussion of the risks, benefits and alternatives to treatment. The patient was placed supine on the fluoroscopy table and the  right knee was supported in a slight degree of flexion and external rotation. The right knee was localized with fluoroscopy. The skin was prepped with ChloraPrep and draped in usual sterile fashion. The skin and overlying soft tissues were anesthetized with 1% lidocaine. An 18 gauge needle was advanced into the joint space via a medial patellofemoral approach. A fluoroscopic image confirming needle placement was saved. Approximately 5 mL of clear, straw-colored fluid were aspirated. Samples were sent to the laboratory for analysis as ordered by the clinical team. The needle was removed and a dressing was placed. The patient tolerated procedure well without immediate postprocedural complication. IMPRESSION: Successful fluoroscopic guided aspiration of the right knee. Procedure performed by: Corrin Parker, PA-C Electronically Signed   By: Sebastian Ache M.D.   On: 11/01/2022 10:51     Assessment/Plan: Fungemia = can transition to iv fluconazole while patient improves from his gi symptoms  Nausea = of unclear source, consider pretreating with anti emetics prior to any oral meds. Continue with supportive care  Right knee effusion = aspirate shows Calcium crystals c/w pseudogout. Will continue to monitor cx to see if + c.albicans which would extend his 2 week course.  [facility] Pager: [phone]  11/02/2022, 5:46 PM

## 2022-11-02 NOTE — Plan of Care (Signed)

## 2022-11-02 NOTE — Progress Notes (Signed)
TRH night cross cover note:   I was notified by RN of the patient's request for a sleep aid, with the patient conveying that he feels that trazodone has been ineffective for him as a sleep aid.  I subsequently placed order for prn melatonin for insomnia.     Newton Pigg, DO Hospitalist

## 2022-11-02 NOTE — Progress Notes (Addendum)
PROGRESS NOTE        PATIENT DETAILS Name: Nicholas [name] Age: 52 y.o. Sex: male Date of Birth: 1971-01-22 Admit Date: 10/26/2022 Admitting Physician Dewayne Shorter Levora Dredge, MD PCP:Pcp, No  Brief Summary: Patient is a 52 y.o.  male with history of polysubstance abuse (heroin/Suboxone/oral narcotics/EtOH use)-who signed out AMA on 9/27 after presenting with vertigo-presented back to the ED on 9/27 evening with vertigo and just not feeling well.  Acknowledges taking a "pain pill" after he left the hospital AMA.  Patient was subsequently admitted to the hospitalist service-post admission-1/2 set of blood cultures positive for fungemia.  Significant events: 9/26-9/27>> hospitalized for evaluation of vertigo-left AMA 9/27>> readmit to [facility] 10/2>> worsening right knee pain-Ortho consulted 10/3>> right knee aspiration by IR-+ crystals in synovial fluid-indomethacin started 10/4>> numerous episodes of vomiting overnight-epigastric pain.  CT abdomen pending.  Significant studies: 9/25>> x-ray right wrist: No acute fracture 9/25>> x-ray right knee: No fracture 9/26>> CT angio head/neck: No LVO or hemodynamically significant stenosis. 9/26>> UDS:+ Cocaine 9/27>> UDS:+ Cocaine/opiates (acknowledges taking a pain pill after he signed out AMA) 9/27>> CXR: No PNA 9/29>> MRI right knee: No evidence of septic arthritis/osteomyelitis. 9/30>> echo: EF 55-60%, grade 1 diastolic dysfunction, intra-atrial septal aneurysm with associated PFO  Significant microbiology data: 9/27>> blood culture: Candida albicans 9/27>> COVID/influenza/RSV PCR: Negative 9/30>> blood culture: No growth 10/3>> right knee synovial fluid culture: No growth 10/3>> right knee synovial fluid fungal culture: Pending  Procedures: 10/3>> fluoroscopy-guided knee aspiration by IR (WBC 280-91% monocytes-+ intracellular calcium phosphate crystals).  Consults: Infectious  disease Ophthalmology  Subjective: Developed several episodes of nausea/vomiting overnight-complaining of some epigastric pain.  Objective: Vitals: Blood pressure (!) 114/95, pulse 74, temperature 98.8 F (37.1 C), resp. rate (!) 26, height 6' (1.829 m), weight 68 kg, SpO2 100%.   Exam: Gen Exam: In some distress due to vomiting HEENT:atraumatic, normocephalic Chest: B/L clear to auscultation anteriorly CVS:S1S2 regular Abdomen: Soft-mildly tender epigastric area-no rebound/peritoneal signs. Extremities:no edema Neurology: Non focal-significant improvement in right knee tenderness. Skin: no rash  Pertinent Labs/Radiology:    Latest Ref Rng & Units 11/02/2022   10:35 AM 11/01/2022    4:32 AM 10/29/2022    4:43 AM  CBC  WBC 4.0 - 10.5 K/uL 8.6  8.7  6.1   Hemoglobin 13.0 - 17.0 g/dL 16.1  09.6  04.5   Hematocrit 39.0 - 52.0 % 45.2  43.0  38.6   Platelets 150 - 400 K/uL 205  200  198     Lab Results  Component Value Date   NA 141 11/02/2022   K 4.6 11/02/2022   CL 110 11/02/2022   CO2 20 (L) 11/02/2022    Assessment/Plan: Hypotension Likely due to hypovolemia BP now stable with supportive care.  Fungemia In the setting of drug use-when asked on 9/29-he acknowledges intermittent IV drug use Ophthalmology eval on 9/29-no evidence of eye involvement TEE negative for endocarditis Repeat blood cultures negative Due to worsening right knee pain-arthrocentesis done on 10/3-+ crystals-fungal cultures pending-but bacterial cultures are negative so far-however does not appear consistent with septic arthritis. On fluconazole-will switch to IV route given persistent vomiting today.  Right knee pain/swelling See above Holding indomethacin given vomiting.  Intractable nausea/vomiting Occurred overnight through this morning ?  Etiology-has some mild epigastric tenderness Has received only a few doses of NSAIDs for right knee  arthritis-so unlikely this to be the cause Lipase  negative Denies illicit drug use while in the hospital  Getting CT abdomen to rule out lipase negative pancreatitis/SBO/other pathologies N.p.o.-IVF-IV PPI for now As needed antiemetics Follow CT abdomen/pelvis.  Vertigo Concern for BPPV-as some of it appears positional Resolved with supportive care and as needed meclizine. Reviewed prior progress note from Dr. Raeanne Gathers to obtain MRI due to metallic object embedded in his chest.  AKI Suspect this is hemodynamically mediated Minimal proteinuria Creatinine has normalized Follow periodically.  Polysubstance abuse (IV drug use) EtOH use Acknowledges doing cocaine-pain pills and doing heroin when he cannot get pain pills (apparently addicted to narcotics since recent trauma resulting in jaw fracture several months ago).  On 9/29-acknowledges occasional IV drug use. Completed Ativan/clonidine taper  Patient interested in inpatient rehab-discussed with social worker-since he has been detoxed-he has been denied inpatient rehab from the facility he desired. Avoid narcotics/Benzos  Borderline vitamin B12 deficiency Continue supplementation SQ x 7 days-and then transition to oral B12 on discharge-patient to follow-up with PCP in 6 weeks for repeat B12 levels.  Interatrial septal aneurysm with associated PFO Incidental finding on echo Outpatient follow-up with cardiology  Anxiety disorder Psych consulted 10/3-Zoloft/trazodone started.  Brief/bereavement Brother died over the weekend with overdose in [place].  BMI: Estimated body mass index is 20.33 kg/m as calculated from the following:   Height as of this encounter: 6' (1.829 m).   Weight as of this encounter: 68 kg.   Code status:   Code Status: Full Code   DVT Prophylaxis: enoxaparin (LOVENOX) injection 40 mg Start: 10/27/22 1400   Family Communication: None at bedside   Disposition Plan: Status is: Observation The patient will require care  spanning > 2 midnights and should be moved to inpatient because: Severity of illness   Planned Discharge Destination:Home   Diet: Diet Order             Diet NPO time specified Except for: Ice Chips, Sips with Meds  Diet effective now                     Antimicrobial agents: Anti-infectives (From admission, onward)    Start     Dose/Rate Route Frequency Ordered Stop   10/31/22 1000  fluconazole (DIFLUCAN) tablet 400 mg       Placed in "Followed by" Linked Group   400 mg Oral Daily 10/29/22 1122 11/11/22 2359   10/30/22 0930  fluconazole (DIFLUCAN) tablet 800 mg       Placed in "Followed by" Linked Group   800 mg Oral  Once 10/29/22 1122 10/30/22 0919   10/28/22 0800  micafungin (MYCAMINE) 150 mg in sodium chloride 0.9 % 100 mL IVPB  Status:  Discontinued        150 mg 107.5 mL/hr over 1 Hours Intravenous Every 24 hours 10/28/22 0657 10/29/22 1122   10/26/22 2045  vancomycin (VANCOREADY) IVPB 1500 mg/300 mL        1,500 mg 150 mL/hr over 120 Minutes Intravenous  Once 10/26/22 2030 10/27/22 0120   10/26/22 2030  ceFEPIme (MAXIPIME) 2 g in sodium chloride 0.9 % 100 mL IVPB        2 g 200 mL/hr over 30 Minutes Intravenous  Once 10/26/22 2028 10/26/22 2130   10/26/22 2030  metroNIDAZOLE (FLAGYL) IVPB 500 mg        500 mg 100 mL/hr over 60 Minutes Intravenous  Once 10/26/22 2028 10/27/22 0259   10/26/22  2030  vancomycin (VANCOCIN) IVPB 1000 mg/200 mL premix  Status:  Discontinued        1,000 mg 200 mL/hr over 60 Minutes Intravenous  Once 10/26/22 2028 10/26/22 2030        MEDICATIONS: Scheduled Meds:  acetaminophen  1,000 mg Oral Q8H   cyanocobalamin  1,000 mcg Subcutaneous Daily   enoxaparin (LOVENOX) injection  40 mg Subcutaneous Q24H   fluconazole  400 mg Oral Daily   folic acid  1 mg Oral Daily   multivitamin with minerals  1 tablet Oral Daily   nicotine  21 mg Transdermal Daily   pantoprazole (PROTONIX) IV  40 mg Intravenous Q12H   thiamine  100 mg Oral  Daily   Continuous Infusions:  sodium chloride 75 mL/hr at 11/02/22 1059   promethazine (PHENERGAN) injection (IM or IVPB) 25 mg (11/02/22 1201)    PRN Meds:.calcium carbonate, hydrOXYzine, meclizine, methocarbamol, ondansetron **OR** ondansetron (ZOFRAN) IV, mouth rinse, promethazine (PHENERGAN) injection (IM or IVPB)   I have personally reviewed following labs and imaging studies  LABORATORY DATA: CBC: Recent Labs  Lab 10/26/22 2022 10/27/22 0442 10/28/22 2210 10/29/22 0443 11/01/22 0432 11/02/22 1035  WBC 15.7* 10.3 7.0 6.1 8.7 8.6  NEUTROABS 12.4*  --   --   --   --   --   HGB 14.3 11.1* 11.5* 12.2* 14.2 14.6  HCT 43.8 35.4* 35.5* 38.6* 43.0 45.2  MCV 87.4 86.8 86.0 86.0 83.3 84.0  PLT 320 210 184 198 200 205    Basic Metabolic Panel: Recent Labs  Lab 10/28/22 1523 10/29/22 0443 10/30/22 0404 11/01/22 0432 11/02/22 0831  NA 138 140 139 140 141  K 4.6 4.4 4.0 4.1 4.6  CL 107 109 109 109 110  CO2 21* 24 21* 22 20*  GLUCOSE 108* 95 96 102* 151*  BUN 26* 21* 22* 18 21*  CREATININE 1.16 1.31* 1.05 1.00 1.12  CALCIUM 8.2* 8.7* 8.8* 8.9 9.3    GFR: Estimated Creatinine Clearance: 72.5 mL/min (by C-G formula based on SCr of 1.12 mg/dL).  Liver Function Tests: Recent Labs  Lab 10/26/22 2022 10/28/22 2210 10/29/22 0443 11/02/22 0831  AST 17 17 17 25   ALT 16 14 15 20   ALKPHOS 70 52 57 64  BILITOT 0.4 0.3 <0.1* 0.6  PROT 7.7 5.7* 6.1* 7.3  ALBUMIN 4.0 2.9* 3.1* 3.9   Recent Labs  Lab 11/02/22 0831  LIPASE 34   No results for input(s): "AMMONIA" in the last 168 hours.  Coagulation Profile: Recent Labs  Lab 10/26/22 2045  INR 1.2    Cardiac Enzymes: No results for input(s): "CKTOTAL", "CKMB", "CKMBINDEX", "TROPONINI" in the last 168 hours.  BNP (last 3 results) No results for input(s): "PROBNP" in the last 8760 hours.  Lipid Profile: No results for input(s): "CHOL", "HDL", "LDLCALC", "TRIG", "CHOLHDL", "LDLDIRECT" in the last 72  hours.  Thyroid Function Tests: No results for input(s): "TSH", "T4TOTAL", "FREET4", "T3FREE", "THYROIDAB" in the last 72 hours.  Anemia Panel: No results for input(s): "VITAMINB12", "FOLATE", "FERRITIN", "TIBC", "IRON", "RETICCTPCT" in the last 72 hours.   Urine analysis:    Component Value Date/Time   COLORURINE YELLOW 10/27/2022 0147   APPEARANCEUR CLEAR 10/27/2022 0147   LABSPEC 1.013 10/27/2022 0147   PHURINE 7.0 10/27/2022 0147   GLUCOSEU 50 (A) 10/27/2022 0147   HGBUR NEGATIVE 10/27/2022 0147   BILIRUBINUR NEGATIVE 10/27/2022 0147   KETONESUR 5 (A) 10/27/2022 0147   PROTEINUR 30 (A) 10/27/2022 0147   UROBILINOGEN 0.2 02/01/2012 1041  NITRITE NEGATIVE 10/27/2022 0147   LEUKOCYTESUR NEGATIVE 10/27/2022 0147    Sepsis Labs: Lactic Acid, Venous    Component Value Date/Time   LATICACIDVEN 1.8 10/26/2022 2034    MICROBIOLOGY: Recent Results (from the past 240 hour(s))  Blood Culture (routine x 2)     Status: Abnormal (Preliminary result)   Collection Time: 10/26/22  8:45 PM   Specimen: BLOOD  Result Value Ref Range Status   Specimen Description BLOOD SITE NOT SPECIFIED  Final   Special Requests   Final    BOTTLES DRAWN AEROBIC AND ANAEROBIC Blood Culture adequate volume   Culture  Setup Time   Final    YEAST AEROBIC BOTTLE ONLY CRITICAL RESULT CALLED TO, READ BACK BY AND VERIFIED WITH: J WYLAND,PHARMD@[id number]  10/28/22 MK    Culture (A)  Final    CANDIDA ALBICANS Sent to [facility] for further susceptibility testing. Performed at [facility], [address] [place], [place] [address]    Report Status PENDING  Incomplete  Blood Culture ID Panel (Reflexed)     Status: Abnormal   Collection Time: 10/26/22  8:45 PM  Result Value Ref Range Status   Enterococcus faecalis NOT DETECTED NOT DETECTED Final   Enterococcus Faecium NOT DETECTED NOT DETECTED Final   Listeria monocytogenes NOT DETECTED NOT DETECTED Final   Staphylococcus species NOT DETECTED NOT  DETECTED Final   Staphylococcus aureus (BCID) NOT DETECTED NOT DETECTED Final   Staphylococcus epidermidis NOT DETECTED NOT DETECTED Final   Staphylococcus lugdunensis NOT DETECTED NOT DETECTED Final   Streptococcus species NOT DETECTED NOT DETECTED Final   Streptococcus agalactiae NOT DETECTED NOT DETECTED Final   Streptococcus pneumoniae NOT DETECTED NOT DETECTED Final   Streptococcus pyogenes NOT DETECTED NOT DETECTED Final   A.calcoaceticus-baumannii NOT DETECTED NOT DETECTED Final   Bacteroides fragilis NOT DETECTED NOT DETECTED Final   Enterobacterales NOT DETECTED NOT DETECTED Final   Enterobacter cloacae complex NOT DETECTED NOT DETECTED Final   Escherichia coli NOT DETECTED NOT DETECTED Final   Klebsiella aerogenes NOT DETECTED NOT DETECTED Final   Klebsiella oxytoca NOT DETECTED NOT DETECTED Final   Klebsiella pneumoniae NOT DETECTED NOT DETECTED Final   Proteus species NOT DETECTED NOT DETECTED Final   Salmonella species NOT DETECTED NOT DETECTED Final   Serratia marcescens NOT DETECTED NOT DETECTED Final   Haemophilus influenzae NOT DETECTED NOT DETECTED Final   Neisseria meningitidis NOT DETECTED NOT DETECTED Final   Pseudomonas aeruginosa NOT DETECTED NOT DETECTED Final   Stenotrophomonas maltophilia NOT DETECTED NOT DETECTED Final   Candida albicans DETECTED (A) NOT DETECTED Final    Comment: CRITICAL RESULT CALLED TO, READ BACK BY AND VERIFIED WITH: J WYLAND,PHARMD@0713  10/28/22 MK    Candida auris NOT DETECTED NOT DETECTED Final   Candida glabrata NOT DETECTED NOT DETECTED Final   Candida krusei NOT DETECTED NOT DETECTED Final   Candida parapsilosis NOT DETECTED NOT DETECTED Final   Candida tropicalis NOT DETECTED NOT DETECTED Final   Cryptococcus neoformans/gattii NOT DETECTED NOT DETECTED Final    Comment: Performed at [facility], [address] [place], [place] [address]  Antifungal AST 9 Drug Panel     Status: None (Preliminary result)   Collection  Time: 10/26/22  8:45 PM  Result Value Ref Range Status   Organism ID, Yeast Candida albicans  Final    Comment: (NOTE) Identification performed by account, not confirmed by this laboratory.    Amphotericin B MIC 0.5 ug/mL  Final    Comment: (NOTE) Breakpoints  have been established for only some organism-drug combinations as indicated. This test was developed and its performance characteristics determined by [facility]. It has not been cleared or approved by the Food and Drug Administration.    Anidulafungin MIC Comment  Final    Comment: (NOTE) 0.06 ug/mL Susceptible Breakpoints have been established for only some organism-drug combinations as indicated. This test was developed and its performance characteristics determined by [facility]. It has not been cleared or approved by the Food and Drug Administration.    Caspofungin MIC Comment  Final    Comment: (NOTE) 0.06 ug/mL Susceptible Breakpoints have been established for only some organism-drug combinations as indicated. This test was developed and its performance characteristics determined by [facility]. It has not been cleared or approved by the Food and Drug Administration.    Micafungin MIC Comment  Final    Comment: (NOTE) 0.016 ug/mL Susceptible Breakpoints have been established for only some organism-drug combinations as indicated. This test was developed and its performance characteristics determined by [facility]. It has not been cleared or approved by the Food and Drug Administration.    Posaconazole MIC 0.03 ug/mL  Final    Comment: (NOTE) Breakpoints have been established for only some organism-drug combinations as indicated. This test was developed and its performance characteristics determined by [facility]. It has not been cleared or approved by the Food and Drug Administration.    Fluconazole Islt MIC 1.0 ug/mL Susceptible  Final    Comment: (NOTE) Breakpoints have been established for only some  organism-drug combinations as indicated. This test was developed and its performance characteristics determined by [facility]. It has not been cleared or approved by the Food and Drug Administration.    Flucytosine MIC 0.12 ug/mL  Final    Comment: (NOTE) Breakpoints have been established for only some organism-drug combinations as indicated. This test was developed and its performance characteristics determined by [facility]. It has not been cleared or approved by the Food and Drug Administration.    Itraconazole MIC 0.06 ug/mL  Final    Comment: (NOTE) Breakpoints have been established for only some organism-drug combinations as indicated. This test was developed and its performance characteristics determined by [facility]. It has not been cleared or approved by the Food and Drug Administration.    Voriconazole MIC Comment  Final    Comment: (NOTE) 0.016 ug/mL Susceptible Breakpoints have been established for only some organism-drug combinations as indicated. This test was developed and its performance characteristics determined by [facility]. It has not been cleared or approved by the Food and Drug Administration. Performed At: [facility] [address] [place], [place] [id number] Jolene Schimke MD ZD:[id number]    Source PENDING  Incomplete  Blood Culture (routine x 2)     Status: None   Collection Time: 10/26/22  8:50 PM   Specimen: BLOOD  Result Value Ref Range Status   Specimen Description BLOOD SITE NOT SPECIFIED  Final   Special Requests   Final    BOTTLES DRAWN AEROBIC AND ANAEROBIC Blood Culture results may not be optimal due to an inadequate volume of blood received in culture bottles   Culture   Final    NO GROWTH 5 DAYS Performed at [facility], [address] [place], [place] [address]    Report Status 10/31/2022 FINAL  Final  Resp panel by RT-PCR (RSV, Flu A&B, Covid) Anterior Nasal Swab     Status: None   Collection Time: 10/26/22  9:55 PM    Specimen: Anterior Nasal Swab  Result Value Ref Range Status   SARS Coronavirus 2 by RT PCR NEGATIVE NEGATIVE Final   Influenza A by PCR NEGATIVE NEGATIVE Final   Influenza B by PCR NEGATIVE NEGATIVE Final    Comment: (NOTE) The Xpert Xpress SARS-CoV-2/FLU/RSV plus assay is intended as an aid in the diagnosis of influenza from Nasopharyngeal swab specimens and should not be used as a sole basis for treatment. Nasal washings and aspirates are unacceptable for Xpert Xpress SARS-CoV-2/FLU/RSV testing.  Fact Sheet for Patients: BloggerCourse.com  Fact Sheet for Healthcare Providers: SeriousBroker.it  This test is not yet approved or cleared by the [place] FDA and has been authorized for detection and/or diagnosis of SARS-CoV-2 by FDA under an Emergency Use Authorization (EUA). This EUA will remain in effect (meaning this test can be used) for the duration of the COVID-19 declaration under Section 564(b)(1) of the Act, 21 U.S.C. section 360bbb-3(b)(1), unless the authorization is terminated or revoked.     Resp Syncytial Virus by PCR NEGATIVE NEGATIVE Final    Comment: (NOTE) Fact Sheet for Patients: BloggerCourse.com  Fact Sheet for Healthcare Providers: SeriousBroker.it  This test is not yet approved or cleared by the [place] FDA and has been authorized for detection and/or diagnosis of SARS-CoV-2 by FDA under an Emergency Use Authorization (EUA). This EUA will remain in effect (meaning this test can be used) for the duration of the COVID-19 declaration under Section 564(b)(1) of the Act, 21 U.S.C. section 360bbb-3(b)(1), unless the authorization is terminated or revoked.  Performed at [facility], [address], [place], [place] [address]   Culture, blood (Routine X 2) w Reflex to ID Panel     Status: None (Preliminary result)   Collection Time: 10/29/22   4:42 AM   Specimen: BLOOD LEFT ARM  Result Value Ref Range Status   Specimen Description BLOOD LEFT ARM  Final   Special Requests   Final    BOTTLES DRAWN AEROBIC AND ANAEROBIC Blood Culture results may not be optimal due to an excessive volume of blood received in culture bottles   Culture   Final    NO GROWTH 4 DAYS Performed at [facility], [address] [place], [place] [address]    Report Status PENDING  Incomplete  Culture, blood (Routine X 2) w Reflex to ID Panel     Status: None (Preliminary result)   Collection Time: 10/29/22  4:45 AM   Specimen: BLOOD LEFT ARM  Result Value Ref Range Status   Specimen Description BLOOD LEFT ARM  Final   Special Requests   Final    BOTTLES DRAWN AEROBIC ONLY Blood Culture results may not be optimal due to an excessive volume of blood received in culture bottles   Culture   Final    NO GROWTH 4 DAYS Performed at [facility], [address], [place], [place] [address]    Report Status PENDING  Incomplete  Body fluid culture w Gram Stain     Status: None (Preliminary result)   Collection Time: 10/31/22 12:36 PM   Specimen: Synovium; Synovial Fluid  Result Value Ref Range Status   Specimen Description SYNOVIAL  Final   Special Requests right knee  Final   Gram Stain NO WBC SEEN NO ORGANISMS SEEN   Final   Culture   Final    NO GROWTH 1 DAY Performed at [facility], [address], [place], [place] [address]    Report Status PENDING  Incomplete  RADIOLOGY STUDIES/RESULTS: DG FL ASP/INJ MAJOR (SHOULDER, HIP, KNEE)  Result Date: 11/01/2022 INDICATION: Right knee joint effusion. Fungemia with worsening right knee pain. Concern for septic arthritis. EXAM: ARTHROCENTESIS/INJECTION OF LARGE JOINT COMPARISON:  Right knee MRI 10/28/2022 CONTRAST:  None FLUOROSCOPY TIME:  Radiation Exposure Index (as provided by the fluoroscopic device): 0.10 mGy Kerma COMPLICATIONS: None immediate PROCEDURE: Informed written consent was  obtained from the patient after discussion of the risks, benefits and alternatives to treatment. The patient was placed supine on the fluoroscopy table and the right knee was supported in a slight degree of flexion and external rotation. The right knee was localized with fluoroscopy. The skin was prepped with ChloraPrep and draped in usual sterile fashion. The skin and overlying soft tissues were anesthetized with 1% lidocaine. An 18 gauge needle was advanced into the joint space via a medial patellofemoral approach. A fluoroscopic image confirming needle placement was saved. Approximately 5 mL of clear, straw-colored fluid were aspirated. Samples were sent to the laboratory for analysis as ordered by the clinical team. The needle was removed and a dressing was placed. The patient tolerated procedure well without immediate postprocedural complication. IMPRESSION: Successful fluoroscopic guided aspiration of the right knee. Procedure performed by: Corrin Parker, PA-C Electronically Signed   By: Sebastian Ache M.D.   On: 11/01/2022 10:51     LOS: 6 days   Jeoffrey Massed, MD  [facility]    To contact the attending provider between 7A-7P or the covering provider during after hours 7P-7A, please log into the web site [email or web address] and access using universal [facility] password for that web site. If you do not have the password, please call the hospital operator.  11/02/2022, 12:20 PM

## 2022-11-02 NOTE — Progress Notes (Signed)
  Evaluation after Contrast Extravasation  Patient seen and examined immediately after contrast extravasation while in CT.  Exam: There is  swelling at the left AC/proximal forearm area.  There is no erythema. There is no discoloration. There are no blisters. There are no signs of decreased perfusion of the skin.  It is  warm to touch.  The patient has intact ROM in fingers.  Radial pulse is normal.  Per contrast extravasation protocol, I have instructed the patient to keep an ice pack on the area for 20-60 minutes at a time for about 48 hours. Contrast extravasation care order set ordered.     Lynann Bologna [place] PA-C 11/02/2022 1:06 PM

## 2022-11-03 LAB — CULTURE, BLOOD (ROUTINE X 2)
Culture: NO GROWTH
Culture: NO GROWTH

## 2022-11-03 MED ORDER — SODIUM CHLORIDE 0.9 % IV SOLN: INTRAVENOUS | Status: DC

## 2022-11-03 MED ORDER — COLCHICINE 0.3 MG HALF TABLET
0.3000 mg | ORAL_TABLET | Freq: Two times a day (BID) | ORAL | Status: DC
  Administered 2022-11-03: 0.3 mg via ORAL
  Filled 2022-11-03 (×2): qty 1

## 2022-11-03 MED ORDER — FLUCONAZOLE 100 MG PO TABS
400.0000 mg | ORAL_TABLET | Freq: Every day | ORAL | Status: DC
  Administered 2022-11-03: 400 mg via ORAL
  Filled 2022-11-03: qty 4

## 2022-11-03 NOTE — Progress Notes (Signed)
Patient requests to leave the hospital against medical advise. Nicholas [name] is alert and oriented x4. He reports his son is the only one available to pick him up from the hospital and he is available only this afternoon. MD made aware. AMA paperwork in clients chart.

## 2022-11-03 NOTE — Discharge Summary (Signed)
AMA  Patient at this time expresses desire to leave the Hospital immidiately, patient has been warned that this is not Medically advisable at this time, and can result in Medical complications like Death and Disability, patient understands and accepts the risks involved and assumes full responsibilty of this decision.   Susa Raring M.D on 11/03/2022 at 3:03 PM  [facility]  Time < 30 minutes  Last Note Below                          PROGRESS NOTE        PATIENT DETAILS Name: Nicholas [name] Age: 52 y.o. Sex: male Date of Birth: 1970-11-23 Admit Date: 10/26/2022 Admitting Physician Dewayne Shorter Levora Dredge, MD PCP:Pcp, No  Brief Summary: Patient is a 52 y.o.  male with history of polysubstance abuse (heroin/Suboxone/oral narcotics/EtOH use)-who signed out AMA on 9/27 after presenting with vertigo-presented back to the ED on 9/27 evening with vertigo and just not feeling well.  Acknowledges taking a "pain pill" after he left the hospital AMA.  Patient was subsequently admitted to the hospitalist service-post admission-1/2 set of blood cultures positive for fungemia.  Significant events: 9/26-9/27>> hospitalized for evaluation of vertigo-left AMA 9/27>> readmit to [facility] 10/2>> worsening right knee pain-Ortho consulted 10/3>> right knee aspiration by IR-+ crystals in synovial fluid-indomethacin started 10/4>> numerous episodes of vomiting overnight-epigastric pain.  CT abdomen pending.  Significant studies: 9/25>> x-ray right wrist: No acute fracture 9/25>> x-ray right knee: No fracture 9/26>> CT angio head/neck: No LVO or hemodynamically significant stenosis. 9/26>> UDS:+ Cocaine 9/27>> UDS:+ Cocaine/opiates (acknowledges taking a pain pill after he signed out AMA) 9/27>> CXR: No PNA 9/29>> MRI right knee: No evidence of septic  arthritis/osteomyelitis. 9/30>> echo: EF 55-60%, grade 1 diastolic dysfunction, intra-atrial septal aneurysm with associated PFO  Significant microbiology data: 9/27>> blood culture: Candida albicans 9/27>> COVID/influenza/RSV PCR: Negative 9/30>> blood culture: No growth 10/3>> right knee synovial fluid culture: No growth 10/3>> right knee synovial fluid fungal culture: Pending  Procedures: 10/3>> fluoroscopy-guided knee aspiration by IR (WBC 280-91% monocytes-+ intracellular calcium phosphate crystals).  Consults: Infectious disease Ophthalmology  Subjective:  Patient in bed, appears comfortable, denies any headache, no fever, no chest pain or pressure, no shortness of breath , no abdominal pain, nausea resolved. No new focal weakness.   Objective: Vitals: Blood pressure (!) 146/94, pulse 88, temperature 98.4 F (36.9 C), temperature source Oral, resp. rate 18, height 6' (1.829 m), weight 68 kg, SpO2 99%.   Exam:  Awake Alert, No new F.N deficits, Normal affect [place].AT,PERRAL Supple Neck, No JVD,   Symmetrical Chest wall movement, Good air movement bilaterally, CTAB RRR,No Gallops, Rubs or new Murmurs,  +ve B.Sounds, Abd Soft, No tenderness,   No Cyanosis, Clubbing or edema   Assessment/Plan:  Hypotension Likely due to hypovolemia BP now stable with supportive care.  Fungemia In the setting of drug use-when asked on 9/29-he acknowledges intermittent IV drug use, seen by ophthalmology on 10/28/2022, stable TEE, repeat blood cultures negative.  Currently on fluconazole stop date 11/11/2022, seen by ID.  Right knee pain/swelling By orthopedics, knee aspiration revealed pseudogout, supportive care, since cannot  tolerate NSAIDs low-dose colchicine, pain improved  Intractable nausea/vomiting Could be due to NSAIDs or trazodone, both medications held, CT abdomen pelvis stable, nausea vomiting has resolved, monitor on oral diet  Vertigo Concern for BPPV-as some of it  appears positional Resolved with supportive care and as needed meclizine. Reviewed prior progress note from Dr. Raeanne Gathers to obtain MRI due to metallic object embedded in his chest.  AKI Suspect this is hemodynamically mediated Minimal proteinuria Creatinine has normalized Follow periodically.  Polysubstance abuse (IV drug use) EtOH use Acknowledges doing cocaine-pain pills and doing heroin when he cannot get pain pills (apparently addicted to narcotics since recent trauma resulting in jaw fracture several months ago).  On 9/29-acknowledges occasional IV drug use. Completed Ativan/clonidine taper  Patient interested in inpatient rehab-discussed with social worker-since he has been detoxed-he has been denied inpatient rehab from the facility he desired. Avoid narcotics/Benzos  Borderline vitamin B12 deficiency Continue supplementation SQ x 7 days-and then transition to oral B12 on discharge-patient to follow-up with PCP in 6 weeks for repeat B12 levels.  Interatrial septal aneurysm with associated PFO Incidental finding on echo Outpatient follow-up with cardiology  Anxiety disorder Psych consulted 10/3-Zoloft/trazodone started.  Brief/bereavement Brother died over the weekend with overdose in [place].  BMI: Estimated body mass index is 20.33 kg/m as calculated from the following:   Height as of this encounter: 6' (1.829 m).   Weight as of this encounter: 68 kg.   Code status:   Code Status: Full Code   DVT Prophylaxis:    Family Communication: None at bedside   Disposition Plan: Status is: Observation The patient will require care spanning > 2 midnights and should be moved to inpatient because: Severity of illness   Planned Discharge Destination:Home   Diet: Diet Order             DIET SOFT Fluid consistency: Thin  Diet effective now                   MEDICATIONS: Scheduled Meds:  acetaminophen  1,000 mg Oral Q8H   colchicine   0.3 mg Oral BID   enoxaparin (LOVENOX) injection  40 mg Subcutaneous Q24H   fluconazole  400 mg Oral Daily   folic acid  1 mg Oral Daily   multivitamin with minerals  1 tablet Oral Daily   nicotine  21 mg Transdermal Daily   pantoprazole (PROTONIX) IV  40 mg Intravenous Q12H   thiamine  100 mg Oral Daily   Continuous Infusions:  sodium chloride 50 mL/hr at 11/03/22 0900   promethazine (PHENERGAN) injection (IM or IVPB) Stopped (11/02/22 1221)    PRN Meds:.calcium carbonate, hydrOXYzine, meclizine, melatonin, methocarbamol, ondansetron **OR** ondansetron (ZOFRAN) IV, mouth rinse, promethazine (PHENERGAN) injection (IM or IVPB)   I have personally reviewed following labs and imaging studies  LABORATORY DATA:  Recent Labs  Lab 10/28/22 2210 10/29/22 0443 11/01/22 0432 11/02/22 1035  WBC 7.0 6.1 8.7 8.6  HGB 11.5* 12.2* 14.2 14.6  HCT 35.5* 38.6* 43.0 45.2  PLT 184 198 200 205  MCV 86.0 86.0 83.3 84.0  MCH 27.8 27.2 27.5 27.1  MCHC 32.4 31.6 33.0 32.3  RDW 14.2 14.4 14.6 14.6    Recent Labs  Lab 10/28/22 1523 10/28/22 2210 10/29/22 0443 10/30/22 0404 11/01/22 0432 11/02/22 0831  NA 138  --  140 139 140 141  K 4.6  --  4.4 4.0 4.1 4.6  CL 107  --  109 109 109 110  CO2  21*  --  24 21* 22 20*  ANIONGAP 10  --  7 9 9 11   GLUCOSE 108*  --  95 96 102* 151*  BUN 26*  --  21* 22* 18 21*  CREATININE 1.16  --  1.31* 1.05 1.00 1.12  AST  --  17 17  --   --  25  ALT  --  14 15  --   --  20  ALKPHOS  --  52 57  --   --  64  BILITOT  --  0.3 <0.1*  --   --  0.6  ALBUMIN  --  2.9* 3.1*  --   --  3.9  CALCIUM 8.2*  --  8.7* 8.8* 8.9 9.3    Lab Results  Component Value Date   CHOL 133 03/22/2017   HDL 36 (L) 03/22/2017   LDLCALC 77 03/22/2017   TRIG 101 03/22/2017   CHOLHDL 3.7 03/22/2017      Recent Labs  Lab 10/28/22 1523 10/29/22 0443 10/30/22 0404 11/01/22 0432 11/02/22 0831  CALCIUM 8.2* 8.7* 8.8* 8.9 9.3       RADIOLOGY STUDIES/RESULTS: CT ABDOMEN  PELVIS W CONTRAST  Result Date: 11/02/2022 CLINICAL DATA:  Abdominal pain, acute, nonlocalized EXAM: CT ABDOMEN AND PELVIS WITH CONTRAST TECHNIQUE: Multidetector CT imaging of the abdomen and pelvis was performed using the standard protocol following bolus administration of intravenous contrast. RADIATION DOSE REDUCTION: This exam was performed according to the departmental dose-optimization program which includes automated exposure control, adjustment of the mA and/or kV according to patient size and/or use of iterative reconstruction technique. CONTRAST:  60mL OMNIPAQUE IOHEXOL 350 MG/ML SOLN, 50mL OMNIPAQUE IOHEXOL 350 MG/ML SOLN COMPARISON:  CT scan abdomen and pelvis from 07/31/2022. FINDINGS: Lower chest: There are dependent changes in the visualized lung bases. No overt consolidation. No pleural effusion. The heart is normal in size. No pericardial effusion. Hepatobiliary: Stable mild-to-moderate hepatomegaly (craniocaudal length of 19.7 cm). Non-cirrhotic configuration. No suspicious mass. No intrahepatic or extrahepatic bile duct dilation. No calcified gallstones. Normal gallbladder wall thickness. No pericholecystic inflammatory changes. Pancreas: No focal mass. Redemonstration of dilated main pancreatic duct measuring up to 6 mm in diameter, in the head region, similar to several prior studies. No peripancreatic fat stranding. Spleen: Within normal limits. No focal lesion. Adrenals/Urinary Tract: There is diffuse thickening of bilateral adrenal glands, without discrete nodule. Findings are nonspecific but mostly associated with adrenal hyperplasia. No significant interval change. No suspicious renal mass. 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 No hydronephrosis. No renal or ureteric calculi. Unremarkable urinary bladder. Stomach/Bowel: No disproportionate dilation of the small or large bowel loops. No evidence of abnormal bowel wall thickening or inflammatory changes. The appendix is unremarkable.  Vascular/Lymphatic: No ascites or pneumoperitoneum. No abdominal or pelvic lymphadenopathy, by size criteria. No aneurysmal dilation of the major abdominal arteries. There are moderate peripheral atherosclerotic vascular calcifications of the aorta and its major branches. Reproductive: Normal size prostate. Symmetric seminal vesicles. Other: The visualized soft tissues and abdominal wall are unremarkable. Musculoskeletal: No suspicious osseous lesions. There are mild multilevel degenerative changes in the visualized spine. IMPRESSION: 1. No acute inflammatory process identified within the abdomen or pelvis. 2. Multiple other nonacute observations, as described above. Aortic Atherosclerosis (ICD10-I70.0). Electronically Signed   By: Jules Schick M.D.   On: 11/02/2022 14:31     LOS: 7 days   Signature  -    Susa Raring M.D on 11/03/2022 at 3:03 PM   -  To  page go to [email or web address]

## 2022-11-03 NOTE — Plan of Care (Signed)

## 2022-11-03 NOTE — Progress Notes (Signed)
PROGRESS NOTE        PATIENT DETAILS Name: Nicholas [name] Age: 52 y.o. Sex: male Date of Birth: [birth year] Admit Date: 10/26/2022 Admitting Physician Dewayne Shorter Levora Dredge, MD PCP:Pcp, No  Brief Summary: Patient is a 52 y.o.  male with history of polysubstance abuse (heroin/Suboxone/oral narcotics/EtOH use)-who signed out AMA on 9/27 after presenting with vertigo-presented back to the ED on 9/27 evening with vertigo and just not feeling well.  Acknowledges taking a "pain pill" after he left the hospital AMA.  Patient was subsequently admitted to the hospitalist service-post admission-1/2 set of blood cultures positive for fungemia.  Significant events: 9/26-9/27>> hospitalized for evaluation of vertigo-left AMA 9/27>> readmit to [facility] 10/2>> worsening right knee pain-Ortho consulted 10/3>> right knee aspiration by IR-+ crystals in synovial fluid-indomethacin started 10/4>> numerous episodes of vomiting overnight-epigastric pain.  CT abdomen pending.  Significant studies: 9/25>> x-ray right wrist: No acute fracture 9/25>> x-ray right knee: No fracture 9/26>> CT angio head/neck: No LVO or hemodynamically significant stenosis. 9/26>> UDS:+ Cocaine 9/27>> UDS:+ Cocaine/opiates (acknowledges taking a pain pill after he signed out AMA) 9/27>> CXR: No PNA 9/29>> MRI right knee: No evidence of septic arthritis/osteomyelitis. 9/30>> echo: EF 55-60%, grade 1 diastolic dysfunction, intra-atrial septal aneurysm with associated PFO  Significant microbiology data: 9/27>> blood culture: Candida albicans 9/27>> COVID/influenza/RSV PCR: Negative 9/30>> blood culture: No growth 10/3>> right knee synovial fluid culture: No growth 10/3>> right knee synovial fluid fungal culture: Pending  Procedures: 10/3>> fluoroscopy-guided knee aspiration by IR (WBC 280-91% monocytes-+ intracellular calcium phosphate crystals).  Consults: Infectious  disease Ophthalmology  Subjective:  Patient in bed, appears comfortable, denies any headache, no fever, no chest pain or pressure, no shortness of breath , no abdominal pain, nausea resolved. No new focal weakness.   Objective: Vitals: Blood pressure (!) 146/94, pulse 88, temperature 98.4 F (36.9 C), temperature source Oral, resp. rate 18, height 6' (1.829 m), weight 68 kg, SpO2 99%.   Exam:  Awake Alert, No new F.N deficits, Normal affect [place].AT,PERRAL Supple Neck, No JVD,   Symmetrical Chest wall movement, Good air movement bilaterally, CTAB RRR,No Gallops, Rubs or new Murmurs,  +ve B.Sounds, Abd Soft, No tenderness,   No Cyanosis, Clubbing or edema   Assessment/Plan:  Hypotension Likely due to hypovolemia BP now stable with supportive care.  Fungemia In the setting of drug use-when asked on 9/29-he acknowledges intermittent IV drug use, seen by ophthalmology on 10/28/2022, stable TEE, repeat blood cultures negative.  Currently on fluconazole stop date 11/11/2022, seen by ID.  Right knee pain/swelling By orthopedics, knee aspiration revealed pseudogout, supportive care, since cannot tolerate NSAIDs low-dose colchicine, pain improved  Intractable nausea/vomiting Could be due to NSAIDs or trazodone, both medications held, CT abdomen pelvis stable, nausea vomiting has resolved, monitor on oral diet  Vertigo Concern for BPPV-as some of it appears positional Resolved with supportive care and as needed meclizine. Reviewed prior progress note from Dr. Raeanne Gathers to obtain MRI due to metallic object embedded in his chest.  AKI Suspect this is hemodynamically mediated Minimal proteinuria Creatinine has normalized Follow periodically.  Polysubstance abuse (IV drug use) EtOH use Acknowledges doing cocaine-pain pills and doing heroin when he cannot get pain pills (apparently addicted to narcotics since recent trauma resulting in jaw fracture several months ago).  On  9/29-acknowledges occasional IV drug use. Completed Ativan/clonidine taper  Patient interested  in inpatient rehab-discussed with social worker-since he has been detoxed-he has been denied inpatient rehab from the facility he desired. Avoid narcotics/Benzos  Borderline vitamin B12 deficiency Continue supplementation SQ x 7 days-and then transition to oral B12 on discharge-patient to follow-up with PCP in 6 weeks for repeat B12 levels.  Interatrial septal aneurysm with associated PFO Incidental finding on echo Outpatient follow-up with cardiology  Anxiety disorder Psych consulted 10/3-Zoloft/trazodone started.  Brief/bereavement Brother died over the weekend with overdose in [place].  BMI: Estimated body mass index is 20.33 kg/m as calculated from the following:   Height as of this encounter: 6' (1.829 m).   Weight as of this encounter: 68 kg.   Code status:   Code Status: Full Code   DVT Prophylaxis: enoxaparin (LOVENOX) injection 40 mg Start: 10/27/22 1400   Family Communication: None at bedside   Disposition Plan: Status is: Observation The patient will require care spanning > 2 midnights and should be moved to inpatient because: Severity of illness   Planned Discharge Destination:Home   Diet: Diet Order             Diet NPO time specified Except for: Ice Chips, Sips with Meds  Diet effective now                   MEDICATIONS: Scheduled Meds:  acetaminophen  1,000 mg Oral Q8H   enoxaparin (LOVENOX) injection  40 mg Subcutaneous Q24H   folic acid  1 mg Oral Daily   multivitamin with minerals  1 tablet Oral Daily   nicotine  21 mg Transdermal Daily   pantoprazole (PROTONIX) IV  40 mg Intravenous Q12H   thiamine  100 mg Oral Daily   Continuous Infusions:  sodium chloride Stopped (11/02/22 1201)   fluconazole (DIFLUCAN) IV 100 mL/hr at 11/02/22 1639   promethazine (PHENERGAN) injection (IM or IVPB) Stopped (11/02/22 1221)    PRN  Meds:.calcium carbonate, hydrOXYzine, meclizine, melatonin, methocarbamol, ondansetron **OR** ondansetron (ZOFRAN) IV, mouth rinse, promethazine (PHENERGAN) injection (IM or IVPB)   I have personally reviewed following labs and imaging studies  LABORATORY DATA:  Recent Labs  Lab 10/28/22 2210 10/29/22 0443 11/01/22 0432 11/02/22 1035  WBC 7.0 6.1 8.7 8.6  HGB 11.5* 12.2* 14.2 14.6  HCT 35.5* 38.6* 43.0 45.2  PLT 184 198 200 205  MCV 86.0 86.0 83.3 84.0  MCH 27.8 27.2 27.5 27.1  MCHC 32.4 31.6 33.0 32.3  RDW 14.2 14.4 14.6 14.6    Recent Labs  Lab 10/28/22 1523 10/28/22 2210 10/29/22 0443 10/30/22 0404 11/01/22 0432 11/02/22 0831  NA 138  --  140 139 140 141  K 4.6  --  4.4 4.0 4.1 4.6  CL 107  --  109 109 109 110  CO2 21*  --  24 21* 22 20*  ANIONGAP 10  --  7 9 9 11   GLUCOSE 108*  --  95 96 102* 151*  BUN 26*  --  21* 22* 18 21*  CREATININE 1.16  --  1.31* 1.05 1.00 1.12  AST  --  17 17  --   --  25  ALT  --  14 15  --   --  20  ALKPHOS  --  52 57  --   --  64  BILITOT  --  0.3 <0.1*  --   --  0.6  ALBUMIN  --  2.9* 3.1*  --   --  3.9  CALCIUM 8.2*  --  8.7*  8.8* 8.9 9.3    Lab Results  Component Value Date   CHOL 133 03/22/2017   HDL 36 (L) 03/22/2017   LDLCALC 77 03/22/2017   TRIG 101 03/22/2017   CHOLHDL 3.7 03/22/2017      Recent Labs  Lab 10/28/22 1523 10/29/22 0443 10/30/22 0404 11/01/22 0432 11/02/22 0831  CALCIUM 8.2* 8.7* 8.8* 8.9 9.3       RADIOLOGY STUDIES/RESULTS: CT ABDOMEN PELVIS W CONTRAST  Result Date: 11/02/2022 CLINICAL DATA:  Abdominal pain, acute, nonlocalized EXAM: CT ABDOMEN AND PELVIS WITH CONTRAST TECHNIQUE: Multidetector CT imaging of the abdomen and pelvis was performed using the standard protocol following bolus administration of intravenous contrast. RADIATION DOSE REDUCTION: This exam was performed according to the departmental dose-optimization program which includes automated exposure control, adjustment of the mA  and/or kV according to patient size and/or use of iterative reconstruction technique. CONTRAST:  60mL OMNIPAQUE IOHEXOL 350 MG/ML SOLN, 50mL OMNIPAQUE IOHEXOL 350 MG/ML SOLN COMPARISON:  CT scan abdomen and pelvis from 07/31/2022. FINDINGS: Lower chest: There are dependent changes in the visualized lung bases. No overt consolidation. No pleural effusion. The heart is normal in size. No pericardial effusion. Hepatobiliary: Stable mild-to-moderate hepatomegaly (craniocaudal length of 19.7 cm). Non-cirrhotic configuration. No suspicious mass. No intrahepatic or extrahepatic bile duct dilation. No calcified gallstones. Normal gallbladder wall thickness. No pericholecystic inflammatory changes. Pancreas: No focal mass. Redemonstration of dilated main pancreatic duct measuring up to 6 mm in diameter, in the head region, similar to several prior studies. No peripancreatic fat stranding. Spleen: Within normal limits. No focal lesion. Adrenals/Urinary Tract: There is diffuse thickening of bilateral adrenal glands, without discrete nodule. Findings are nonspecific but mostly associated with adrenal hyperplasia. No significant interval change. No suspicious renal mass. 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 No hydronephrosis. No renal or ureteric calculi. Unremarkable urinary bladder. Stomach/Bowel: No disproportionate dilation of the small or large bowel loops. No evidence of abnormal bowel wall thickening or inflammatory changes. The appendix is unremarkable. Vascular/Lymphatic: No ascites or pneumoperitoneum. No abdominal or pelvic lymphadenopathy, by size criteria. No aneurysmal dilation of the major abdominal arteries. There are moderate peripheral atherosclerotic vascular calcifications of the aorta and its major branches. Reproductive: Normal size prostate. Symmetric seminal vesicles. Other: The visualized soft tissues and abdominal wall are unremarkable. Musculoskeletal: No suspicious osseous lesions. There are mild  multilevel degenerative changes in the visualized spine. IMPRESSION: 1. No acute inflammatory process identified within the abdomen or pelvis. 2. Multiple other nonacute observations, as described above. Aortic Atherosclerosis (ICD10-I70.0). Electronically Signed   By: Jules Schick M.D.   On: 11/02/2022 14:31   DG FL ASP/INJ MAJOR (SHOULDER, HIP, KNEE)  Result Date: 11/01/2022 INDICATION: Right knee joint effusion. Fungemia with worsening right knee pain. Concern for septic arthritis. EXAM: ARTHROCENTESIS/INJECTION OF LARGE JOINT COMPARISON:  Right knee MRI 10/28/2022 CONTRAST:  None FLUOROSCOPY TIME:  Radiation Exposure Index (as provided by the fluoroscopic device): 0.10 mGy Kerma COMPLICATIONS: None immediate PROCEDURE: Informed written consent was obtained from the patient after discussion of the risks, benefits and alternatives to treatment. The patient was placed supine on the fluoroscopy table and the right knee was supported in a slight degree of flexion and external rotation. The right knee was localized with fluoroscopy. The skin was prepped with ChloraPrep and draped in usual sterile fashion. The skin and overlying soft tissues were anesthetized with 1% lidocaine. An 18 gauge needle was advanced into the joint space via a medial patellofemoral approach. A fluoroscopic image confirming needle  placement was saved. Approximately 5 mL of clear, straw-colored fluid were aspirated. Samples were sent to the laboratory for analysis as ordered by the clinical team. The needle was removed and a dressing was placed. The patient tolerated procedure well without immediate postprocedural complication. IMPRESSION: Successful fluoroscopic guided aspiration of the right knee. Procedure performed by: Corrin Parker, PA-C Electronically Signed   By: Sebastian Ache M.D.   On: 11/01/2022 10:51     LOS: 7 days   Signature  -    Susa Raring M.D on 11/03/2022 at 8:32 AM   -  To page go to [email or web address]

## 2022-11-04 LAB — CULTURE, BLOOD (ROUTINE X 2): Special Requests: ADEQUATE

## 2022-11-04 LAB — BODY FLUID CULTURE W GRAM STAIN
Culture: NO GROWTH
Gram Stain: NONE SEEN

## 2022-11-06 LAB — ANAEROBIC CULTURE W GRAM STAIN

## 2022-11-29 LAB — FUNGUS CULTURE WITH STAIN

## 2022-11-29 LAB — FUNGUS CULTURE RESULT

## 2022-11-29 LAB — FUNGAL ORGANISM REFLEX

## 2023-02-18 ENCOUNTER — Other Ambulatory Visit: Payer: Self-pay

## 2023-02-18 ENCOUNTER — Emergency Department (HOSPITAL_BASED_OUTPATIENT_CLINIC_OR_DEPARTMENT_OTHER)
Admission: EM | Admit: 2023-02-18 | Discharge: 2023-02-19 | Disposition: A | Discharge status: Home / Self Care | Payer: MEDICAID | Attending: Emergency Medicine | Admitting: Emergency Medicine

## 2023-02-18 ENCOUNTER — Encounter (HOSPITAL_BASED_OUTPATIENT_CLINIC_OR_DEPARTMENT_OTHER): Payer: Self-pay | Admitting: Emergency Medicine

## 2023-02-18 DIAGNOSIS — R42 Dizziness and giddiness: Secondary | ICD-10-CM | POA: Insufficient documentation

## 2023-02-18 LAB — BASIC METABOLIC PANEL
Anion gap: 7 (ref 5–15)
BUN: 16 mg/dL (ref 6–20)
CO2: 27 mmol/L (ref 22–32)
Calcium: 8.8 mg/dL — ABNORMAL LOW (ref 8.9–10.3)
Chloride: 105 mmol/L (ref 98–111)
Creatinine, Ser: 0.77 mg/dL (ref 0.61–1.24)
GFR, Estimated: >60 mL/min (ref >60)
Glucose, Bld: 111 mg/dL — ABNORMAL HIGH (ref 70–99)
Potassium: 3.9 mmol/L (ref 3.5–5.1)
Sodium: 139 mmol/L (ref 135–145)

## 2023-02-18 LAB — CBC
HCT: 33 % — ABNORMAL LOW (ref 39.0–52.0)
Hemoglobin: 10.8 g/dL — ABNORMAL LOW (ref 13.0–17.0)
MCH: 27.9 pg (ref 26.0–34.0)
MCHC: 32.7 g/dL (ref 30.0–36.0)
MCV: 85.3 fL (ref 80.0–100.0)
Platelets: 238 10*3/uL (ref 150–400)
RBC: 3.87 MIL/uL — ABNORMAL LOW (ref 4.22–5.81)
RDW: 14.3 % (ref 11.5–15.5)
WBC: 8.5 10*3/uL (ref 4.0–10.5)
nRBC: 0 % (ref 0.0–0.2)

## 2023-02-18 NOTE — ED Notes (Signed)
Extra labs collected and being held in lab--[place], Anthoney Harada, Doylene Canning.

## 2023-02-18 NOTE — ED Triage Notes (Signed)
Reports worsening dizziness x 1 week. States he used heroin 2 days ago. Recently admitted for similar episodes.  Also endorses a fall today, but denies hitting head.

## 2023-02-18 NOTE — ED Notes (Signed)
Unable to get labs in triage 

## 2023-02-18 NOTE — ED Notes (Signed)
ED Provider at bedside. 

## 2023-02-18 NOTE — ED Provider Notes (Signed)
[place] EMERGENCY DEPARTMENT AT [facility] Provider Note   CSN: [id number] Arrival date & time: 02/18/23  1830     History {Add pertinent medical, surgical, social history, OB history to HPI:1} Chief Complaint  Patient presents with  . Dizziness    Nicholas [name] is a 53 y.o. male who presents with dizziness x 1 week.  Denies any injury or trauma.  Symptoms are persistent even at rest, worse with movement.  Patient states he was admitted to the hospital last month for a fungal infection in his blood secondary to IV drug use.  Patient reports he felt dizzy at that time and he is concerned that he has a recurrence of bacteremia.  He does admit to continued IV drug use with last use approximately a week ago.  Patient denies any fevers or chills, chest pain or shortness of breath, no dysuria or increased frequency.  Denies any history of vertigo.  Denies any tinnitus.  Notes that he did sustain a witnessed fall today because of dizziness and landed on his nose and shoulder.  States that he did lose consciousness for a few moments.  Denies any vomiting, blurry vision or difficulty speaking since.   Dizziness      Home Medications Prior to Admission medications   Medication Sig Start Date End Date Taking? Authorizing Provider  acetaminophen (TYLENOL) 500 MG tablet Take 1 tablet (500 mg total) by mouth every 6 (six) hours as needed. Please take every 6 hrs and stagger with Motrin 3 hrs apart from each other. Please do not exceed maximum daily dose to avoid liver damage 08/16/22   Ashok Croon, MD      Allergies    Patient has no known allergies.    Review of Systems   Review of Systems  Neurological:  Positive for dizziness.    Physical Exam Updated Vital Signs BP 113/71   Pulse 74   Temp 98.1 F (36.7 C) (Oral)   Resp 18   Ht 6' (1.829 m)   Wt 81.6 kg   SpO2 100%   BMI 24.41 kg/m  Physical Exam Vitals and nursing note reviewed.  Constitutional:       General: He is not in acute distress.    Appearance: He is well-developed.  HENT:     Head: Normocephalic and atraumatic.     Right Ear: Tympanic membrane normal.     Left Ear: Tympanic membrane normal.  Eyes:     Conjunctiva/sclera: Conjunctivae normal.  Cardiovascular:     Rate and Rhythm: Normal rate and regular rhythm.     Heart sounds: No murmur heard. Pulmonary:     Effort: Pulmonary effort is normal. No respiratory distress.     Breath sounds: Normal breath sounds. No wheezing or rales.  Abdominal:     Palpations: Abdomen is soft.     Tenderness: There is no abdominal tenderness.  Musculoskeletal:        General: No swelling or tenderness.     Cervical back: Normal range of motion and neck supple. No rigidity or tenderness.  Skin:    General: Skin is warm and dry.     Capillary Refill: Capillary refill takes less than 2 seconds.     Comments: No obvious wounds visualized  Neurological:     General: No focal deficit present.     Mental Status: He is alert and oriented to person, place, and time. Mental status is at baseline.     Cranial Nerves: No cranial  nerve deficit.     Sensory: No sensory deficit.     Motor: No weakness.     Coordination: Coordination normal.     Comments: No abnormal speech or aphasia, no facial droop, negative Dix-Hallpike's, EOMI, PERRL, moves all extremities without difficulty, 5 out of 5 strength, no sensory deficit, finger-to-nose intact without difficulty  Psychiatric:        Mood and Affect: Mood normal.    ED Results / Procedures / Treatments   Labs (all labs ordered are listed, but only abnormal results are displayed) Labs Reviewed  BASIC METABOLIC PANEL - Abnormal; Notable for the following components:      Result Value   Glucose, Bld 111 (*)    Calcium 8.8 (*)    All other components within normal limits  CBC - Abnormal; Notable for the following components:   RBC 3.87 (*)    Hemoglobin 10.8 (*)    HCT 33.0 (*)    All other  components within normal limits  URINALYSIS, ROUTINE W REFLEX MICROSCOPIC    EKG None  Radiology No results found.  Procedures Procedures  {Document cardiac monitor, telemetry assessment procedure when appropriate:1}  Medications Ordered in ED Medications - No data to display  ED Course/ Medical Decision Making/ A&P   {   Click here for ABCD2, HEART and other calculatorsREFRESH Note before signing :1}                              Medical Decision Making Amount and/or Complexity of Data Reviewed Labs: ordered.   This patient presents to the ED with chief complaint(s) of dizziness.  The complaint involves an extensive differential diagnosis and also carries with it a high risk of complications and morbidity.   pertinent past medical history as listed in HPI  The differential diagnosis includes  Central versus peripheral vertigo, recurrent bacteremia versus sepsis, intracranial hemorrhage, CVA, TIA, electrolyte abnormality, orthostatic hypotension The initial plan is to  Obtain basic labs, urine, EKG Additional history obtained: Additional history obtained from {additional history:26846} Records reviewed {records:26847}  Initial Assessment:   ***  Independent ECG interpretation:  ***  Independent labs interpretation:  The following labs were independently interpreted:  ***  Independent visualization and interpretation of imaging: I independently visualized the following imaging with scope of interpretation limited to determining acute life threatening conditions related to emergency care: ***, which revealed ***  Treatment and Reassessment: ***  Consultations obtained:   ***  Disposition:   ***  Social Determinants of Health:   Patient's impaired access to primary care  increases the complexity of managing their presentation  This note was dictated with voice recognition software.  Despite best efforts at proofreading, errors may have occurred which can  change the documentation meaning.    {Document critical care time when appropriate:1} {Document review of labs and clinical decision tools ie heart score, Chads2Vasc2 etc:1}  {Document your independent review of radiology images, and any outside records:1} {Document your discussion with family members, caretakers, and with consultants:1} {Document social determinants of health affecting pt's care:1} {Document your decision making why or why not admission, treatments were needed:1} Final Clinical Impression(s) / ED Diagnoses Final diagnoses:  None    Rx / DC Orders ED Discharge Orders     None

## 2023-02-19 ENCOUNTER — Emergency Department (HOSPITAL_BASED_OUTPATIENT_CLINIC_OR_DEPARTMENT_OTHER): Payer: MEDICAID

## 2023-02-19 MED ORDER — MECLIZINE HCL 12.5 MG PO TABS: 12.5000 mg | ORAL_TABLET | Freq: Three times a day (TID) | ORAL | 0 refills | Status: AC | PRN

## 2023-02-19 MED ORDER — MECLIZINE HCL 25 MG PO TABS
25.0000 mg | ORAL_TABLET | Freq: Once | ORAL | Status: AC
  Administered 2023-02-19: 25 mg via ORAL
  Filled 2023-02-19: qty 1

## 2023-02-19 NOTE — Discharge Instructions (Addendum)
You were evaluated in the emergency room for dizziness.  Your lab work, EKG and CT imaging did not show any acute abnormality.  Your dizziness is likely due to vertigo.   Please follow-up with your primary care provider within the next week.  If you experience any new or worsening symptoms including difficulty walking, worsening, blurry vision, weakness please return to the emergency room.

## 2023-02-19 NOTE — ED Provider Notes (Incomplete)
[place] EMERGENCY DEPARTMENT AT [facility] Provider Note   CSN: [id number] Arrival date & time: 02/18/23  1830     History {Add pertinent medical, surgical, social history, OB history to HPI:1} Chief Complaint  Patient presents with  . Dizziness    Nicholas [name] is a 53 y.o. male who presents with dizziness x 1 week.  Denies any injury or trauma.  Symptoms are persistent even at rest, worse with movement.  Patient states he was admitted to the hospital last month for a fungal infection in his blood secondary to IV drug use.  Patient reports he felt dizzy at that time and he is concerned that he has a recurrence of bacteremia.  He does admit to continued IV drug use with last use approximately a week ago.  Patient denies any fevers or chills, chest pain or shortness of breath, no dysuria or increased frequency.  Denies any history of vertigo.  Denies any tinnitus.  Notes that he did sustain a witnessed fall today because of dizziness and landed on his nose and shoulder.  States that he did lose consciousness for a few moments.  Denies any vomiting, blurry vision or difficulty speaking since.   Dizziness      Home Medications Prior to Admission medications   Medication Sig Start Date End Date Taking? Authorizing Provider  acetaminophen (TYLENOL) 500 MG tablet Take 1 tablet (500 mg total) by mouth every 6 (six) hours as needed. Please take every 6 hrs and stagger with Motrin 3 hrs apart from each other. Please do not exceed maximum daily dose to avoid liver damage 08/16/22   Ashok Croon, MD      Allergies    Patient has no known allergies.    Review of Systems   Review of Systems  Neurological:  Positive for dizziness.    Physical Exam Updated Vital Signs BP 113/71   Pulse 74   Temp 98.1 F (36.7 C) (Oral)   Resp 18   Ht 6' (1.829 m)   Wt 81.6 kg   SpO2 100%   BMI 24.41 kg/m  Physical Exam Vitals and nursing note reviewed.  Constitutional:       General: He is not in acute distress.    Appearance: He is well-developed.  HENT:     Head: Normocephalic and atraumatic.     Right Ear: Tympanic membrane normal.     Left Ear: Tympanic membrane normal.  Eyes:     Conjunctiva/sclera: Conjunctivae normal.  Cardiovascular:     Rate and Rhythm: Normal rate and regular rhythm.     Heart sounds: No murmur heard. Pulmonary:     Effort: Pulmonary effort is normal. No respiratory distress.     Breath sounds: Normal breath sounds. No wheezing or rales.  Abdominal:     Palpations: Abdomen is soft.     Tenderness: There is no abdominal tenderness.  Musculoskeletal:        General: No swelling or tenderness.     Cervical back: Normal range of motion and neck supple. No rigidity or tenderness.  Skin:    General: Skin is warm and dry.     Capillary Refill: Capillary refill takes less than 2 seconds.     Comments: No obvious wounds visualized  Neurological:     General: No focal deficit present.     Mental Status: He is alert and oriented to person, place, and time. Mental status is at baseline.     Cranial Nerves: No cranial  nerve deficit.     Sensory: No sensory deficit.     Motor: No weakness.     Coordination: Coordination normal.     Comments: No abnormal speech or aphasia, no facial droop, negative Dix-Hallpike's, EOMI, PERRL, moves all extremities without difficulty, 5 out of 5 strength, no sensory deficit, finger-to-nose intact without difficulty  Psychiatric:        Mood and Affect: Mood normal.    ED Results / Procedures / Treatments   Labs (all labs ordered are listed, but only abnormal results are displayed) Labs Reviewed  BASIC METABOLIC PANEL - Abnormal; Notable for the following components:      Result Value   Glucose, Bld 111 (*)    Calcium 8.8 (*)    All other components within normal limits  CBC - Abnormal; Notable for the following components:   RBC 3.87 (*)    Hemoglobin 10.8 (*)    HCT 33.0 (*)    All other  components within normal limits  URINALYSIS, ROUTINE W REFLEX MICROSCOPIC    EKG None  Radiology No results found.  Procedures Procedures  {Document cardiac monitor, telemetry assessment procedure when appropriate:1}  Medications Ordered in ED Medications - No data to display  ED Course/ Medical Decision Making/ A&P   {   Click here for ABCD2, HEART and other calculatorsREFRESH Note before signing :1}                              Medical Decision Making Amount and/or Complexity of Data Reviewed Labs: ordered.   This patient presents to the ED with chief complaint(s) of dizziness.  The complaint involves an extensive differential diagnosis and also carries with it a high risk of complications and morbidity.  Pertinent past medical history as listed in HPI  The differential diagnosis includes  Central versus peripheral vertigo, recurrent bacteremia versus sepsis, intracranial hemorrhage, CVA, TIA, electrolyte abnormality, orthostatic hypotension The initial plan is to  Obtain basic labs, urine, EKG Additional history obtained:  Records reviewed previous admission documents and Care Everywhere/External Records  Initial Assessment:   Hemodynamically stable, afebrile, nontoxic-appearing patient presenting with dizziness for the past week.  Dizziness is worse with movement.   Independent ECG interpretation:  Sinus rhythm without ischemic changes  Independent labs interpretation:  The following labs were independently interpreted:  CBC, BMP without significant abnormality, UA pending  Independent visualization and interpretation of imaging: I independently visualized the following imaging with scope of interpretation limited to determining acute life threatening conditions related to emergency care: CT head without, which revealed ***  Treatment and Reassessment: Patient given meclizine 25 mg  Consultations obtained:   None  Disposition:   ***  Social Determinants  of Health:   Patient's impaired access to primary care  increases the complexity of managing their presentation  This note was dictated with voice recognition software.  Despite best efforts at proofreading, errors may have occurred which can change the documentation meaning.    {Document critical care time when appropriate:1} {Document review of labs and clinical decision tools ie heart score, Chads2Vasc2 etc:1}  {Document your independent review of radiology images, and any outside records:1} {Document your discussion with family members, caretakers, and with consultants:1} {Document social determinants of health affecting pt's care:1} {Document your decision making why or why not admission, treatments were needed:1} Final Clinical Impression(s) / ED Diagnoses Final diagnoses:  None    Rx / DC Orders ED Discharge  Orders     None

## 2023-03-31 ENCOUNTER — Encounter (HOSPITAL_COMMUNITY): Payer: Self-pay

## 2023-03-31 ENCOUNTER — Ambulatory Visit (HOSPITAL_COMMUNITY)
Admission: EM | Admit: 2023-03-31 | Discharge: 2023-03-31 | Disposition: A | Discharge status: Home / Self Care | Payer: MEDICAID | Attending: Neurology | Admitting: Neurology

## 2023-03-31 DIAGNOSIS — L0291 Cutaneous abscess, unspecified: Secondary | ICD-10-CM

## 2023-03-31 MED ORDER — CEFTRIAXONE SODIUM 1 G IJ SOLR
INTRAMUSCULAR | Status: AC
  Filled 2023-03-31: qty 10

## 2023-03-31 MED ORDER — ACETAMINOPHEN 325 MG PO TABS
650.0000 mg | ORAL_TABLET | Freq: Once | ORAL | Status: AC
  Administered 2023-03-31: 650 mg via ORAL

## 2023-03-31 MED ORDER — LIDOCAINE HCL (PF) 1 % IJ SOLN
INTRAMUSCULAR | Status: AC
  Filled 2023-03-31: qty 2

## 2023-03-31 MED ORDER — DOXYCYCLINE HYCLATE 100 MG PO CAPS: 100.0000 mg | ORAL_CAPSULE | Freq: Two times a day (BID) | ORAL | 0 refills | Status: AC

## 2023-03-31 MED ORDER — ACETAMINOPHEN 325 MG PO TABS
ORAL_TABLET | ORAL | Status: AC
  Filled 2023-03-31: qty 2

## 2023-03-31 MED ORDER — CEFTRIAXONE SODIUM 1 G IJ SOLR
1.0000 g | INTRAMUSCULAR | Status: DC
  Administered 2023-03-31: 1 g via INTRAMUSCULAR

## 2023-03-31 NOTE — ED Triage Notes (Signed)
 Pt states that he has a abscess on his left arm. X2 days

## 2023-03-31 NOTE — Discharge Instructions (Addendum)
 Take antibiotics as prescribed. Over the counter medications as needed for inflammation and pain. Continue warm compresses/soaks every 4 hours for the next few days If you develop fevers, chills, body aches, or worsening swelling/redness to abscess, please return to urgent care for re-check.   Follow-up with PCP/urgent care as needed. Go to ER for worsening symptoms or if redness extends
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
